# Patient Record
Sex: Female | Born: 1980
Health system: Southern US, Community
[De-identification: ages and names within clinical notes are randomized; demographics above are authoritative.]

## PROBLEM LIST (undated history)

## (undated) DIAGNOSIS — C4491 Basal cell carcinoma of skin, unspecified: Secondary | ICD-10-CM

## (undated) DIAGNOSIS — M359 Systemic involvement of connective tissue, unspecified: Secondary | ICD-10-CM

## (undated) DIAGNOSIS — M069 Rheumatoid arthritis, unspecified: Secondary | ICD-10-CM

## (undated) DIAGNOSIS — R87629 Unspecified abnormal cytological findings in specimens from vagina: Secondary | ICD-10-CM

## (undated) DIAGNOSIS — I7300 Raynaud's syndrome without gangrene: Secondary | ICD-10-CM

## (undated) DIAGNOSIS — R140 Abdominal distension (gaseous): Secondary | ICD-10-CM

## (undated) DIAGNOSIS — C4492 Squamous cell carcinoma of skin, unspecified: Secondary | ICD-10-CM

## (undated) DIAGNOSIS — E538 Deficiency of other specified B group vitamins: Secondary | ICD-10-CM

## (undated) DIAGNOSIS — Z87442 Personal history of urinary calculi: Secondary | ICD-10-CM

## (undated) DIAGNOSIS — R102 Pelvic and perineal pain: Secondary | ICD-10-CM

## (undated) DIAGNOSIS — K769 Liver disease, unspecified: Secondary | ICD-10-CM

## (undated) HISTORY — DX: Squamous cell carcinoma of skin, unspecified: C44.92

## (undated) HISTORY — DX: Pelvic and perineal pain: R10.2

## (undated) HISTORY — DX: Basal cell carcinoma of skin, unspecified: C44.91

## (undated) HISTORY — DX: Deficiency of other specified B group vitamins: E53.8

## (undated) HISTORY — DX: Raynaud's syndrome without gangrene: I73.00

## (undated) HISTORY — DX: Rheumatoid arthritis, unspecified: M06.9

## (undated) HISTORY — DX: Unspecified abnormal cytological findings in specimens from vagina: R87.629

---

## 2001-01-25 HISTORY — PX: LITHOTRIPSY: SUR834

## 2003-11-21 ENCOUNTER — Ambulatory Visit: Payer: Self-pay | Admitting: Obstetrics & Gynecology

## 2004-01-26 HISTORY — PX: LEEP: SHX91

## 2004-04-22 ENCOUNTER — Emergency Department: Payer: Self-pay | Admitting: Emergency Medicine

## 2005-10-26 ENCOUNTER — Other Ambulatory Visit: Payer: Self-pay

## 2005-10-26 ENCOUNTER — Emergency Department: Payer: Self-pay | Admitting: Emergency Medicine

## 2005-11-09 ENCOUNTER — Ambulatory Visit: Payer: Self-pay | Admitting: Obstetrics & Gynecology

## 2006-06-03 ENCOUNTER — Ambulatory Visit: Payer: Self-pay | Admitting: Urology

## 2006-06-30 ENCOUNTER — Ambulatory Visit: Payer: Self-pay | Admitting: Urology

## 2006-08-22 ENCOUNTER — Emergency Department: Payer: Self-pay | Admitting: Emergency Medicine

## 2006-09-01 ENCOUNTER — Encounter: Payer: Self-pay | Admitting: General Practice

## 2006-09-26 ENCOUNTER — Encounter: Payer: Self-pay | Admitting: General Practice

## 2006-11-16 ENCOUNTER — Ambulatory Visit: Payer: Self-pay | Admitting: Family Medicine

## 2006-12-05 ENCOUNTER — Ambulatory Visit: Payer: Self-pay | Admitting: Urology

## 2006-12-15 ENCOUNTER — Ambulatory Visit: Payer: Self-pay | Admitting: Urology

## 2007-02-14 ENCOUNTER — Other Ambulatory Visit: Payer: Self-pay

## 2007-02-14 ENCOUNTER — Emergency Department: Payer: Self-pay | Admitting: Emergency Medicine

## 2007-10-09 ENCOUNTER — Emergency Department: Payer: Self-pay | Admitting: Internal Medicine

## 2007-12-02 ENCOUNTER — Emergency Department: Payer: Self-pay | Admitting: Emergency Medicine

## 2008-10-29 ENCOUNTER — Emergency Department: Payer: Self-pay | Admitting: Emergency Medicine

## 2008-12-17 ENCOUNTER — Emergency Department: Payer: Self-pay | Admitting: Emergency Medicine

## 2009-05-13 ENCOUNTER — Ambulatory Visit: Payer: Self-pay | Admitting: Internal Medicine

## 2009-10-16 ENCOUNTER — Other Ambulatory Visit: Payer: Self-pay | Admitting: Physician Assistant

## 2010-02-06 ENCOUNTER — Emergency Department: Payer: Self-pay | Admitting: Emergency Medicine

## 2010-02-16 ENCOUNTER — Ambulatory Visit: Payer: Self-pay | Admitting: Internal Medicine

## 2010-04-03 ENCOUNTER — Ambulatory Visit: Payer: Self-pay | Admitting: Orthopedic Surgery

## 2011-03-24 ENCOUNTER — Other Ambulatory Visit: Payer: Self-pay

## 2011-03-24 LAB — CBC WITH DIFFERENTIAL/PLATELET
Basophil #: 0 10*3/uL (ref 0.0–0.1)
Basophil %: 0.3 %
Eosinophil #: 0.1 10*3/uL (ref 0.0–0.7)
Eosinophil %: 1.5 %
HCT: 38.5 % (ref 35.0–47.0)
HGB: 12.5 g/dL (ref 12.0–16.0)
Lymphocyte #: 1.1 10*3/uL (ref 1.0–3.6)
Lymphocyte %: 29.6 %
MCH: 30.9 pg (ref 26.0–34.0)
MCHC: 32.5 g/dL (ref 32.0–36.0)
MCV: 95 fL (ref 80–100)
Monocyte #: 0.3 10*3/uL (ref 0.0–0.7)
Monocyte %: 8.2 %
Neutrophil #: 2.2 10*3/uL (ref 1.4–6.5)
Neutrophil %: 60.4 %
Platelet: 201 10*3/uL (ref 150–440)
RBC: 4.05 10*6/uL (ref 3.80–5.20)
RDW: 11.8 % (ref 11.5–14.5)
WBC: 3.7 10*3/uL (ref 3.6–11.0)

## 2011-03-24 LAB — COMPREHENSIVE METABOLIC PANEL
Albumin: 3.7 g/dL (ref 3.4–5.0)
Alkaline Phosphatase: 46 U/L — ABNORMAL LOW (ref 50–136)
Anion Gap: 13 (ref 7–16)
BUN: 11 mg/dL (ref 7–18)
Bilirubin,Total: 0.5 mg/dL (ref 0.2–1.0)
Calcium, Total: 8.7 mg/dL (ref 8.5–10.1)
Chloride: 104 mmol/L (ref 98–107)
Co2: 25 mmol/L (ref 21–32)
Creatinine: 0.94 mg/dL (ref 0.60–1.30)
EGFR (African American): >60
EGFR (Non-African Amer.): >60
Glucose: 86 mg/dL (ref 65–99)
Osmolality: 282 (ref 275–301)
Potassium: 3.7 mmol/L (ref 3.5–5.1)
SGOT(AST): 20 U/L (ref 15–37)
SGPT (ALT): 23 U/L
Sodium: 142 mmol/L (ref 136–145)
Total Protein: 8 g/dL (ref 6.4–8.2)

## 2011-03-24 LAB — IRON AND TIBC
Iron Bind.Cap.(Total): 364 ug/dL (ref 250–450)
Iron Saturation: 27 %
Iron: 98 ug/dL (ref 50–170)
Unbound Iron-Bind.Cap.: 266 ug/dL

## 2011-03-24 LAB — LIPID PANEL
Cholesterol: 140 mg/dL (ref 0–200)
HDL Cholesterol: 55 mg/dL (ref 40–60)
Ldl Cholesterol, Calc: 74 mg/dL (ref 0–100)
Triglycerides: 54 mg/dL (ref 0–200)
VLDL Cholesterol, Calc: 11 mg/dL (ref 5–40)

## 2011-03-24 LAB — TSH: Thyroid Stimulating Horm: 3.12 u[IU]/mL

## 2011-03-24 LAB — FOLATE: Folic Acid: 26.1 ng/mL (ref 3.1–100.0)

## 2011-04-12 ENCOUNTER — Ambulatory Visit: Payer: Self-pay | Admitting: General Practice

## 2011-12-14 ENCOUNTER — Ambulatory Visit: Payer: Self-pay | Admitting: Internal Medicine

## 2012-08-05 ENCOUNTER — Emergency Department: Payer: Self-pay | Admitting: Emergency Medicine

## 2012-08-05 LAB — URINALYSIS, COMPLETE
Bilirubin,UR: NEGATIVE
Glucose,UR: NEGATIVE mg/dL (ref 0–75)
Ketone: NEGATIVE
Nitrite: NEGATIVE
Ph: 5 (ref 4.5–8.0)
Protein: 30
RBC,UR: 3 /HPF (ref 0–5)
Specific Gravity: 1.032 (ref 1.003–1.030)
Squamous Epithelial: 6
WBC UR: 3 /HPF (ref 0–5)

## 2012-08-05 LAB — CBC
HCT: 38.7 % (ref 35.0–47.0)
HGB: 12.9 g/dL (ref 12.0–16.0)
MCH: 31.4 pg (ref 26.0–34.0)
MCHC: 33.4 g/dL (ref 32.0–36.0)
MCV: 94 fL (ref 80–100)
Platelet: 201 10*3/uL (ref 150–440)
RBC: 4.13 10*6/uL (ref 3.80–5.20)
RDW: 12.8 % (ref 11.5–14.5)
WBC: 9.8 10*3/uL (ref 3.6–11.0)

## 2012-08-05 LAB — BASIC METABOLIC PANEL
Anion Gap: 6 — ABNORMAL LOW (ref 7–16)
BUN: 13 mg/dL (ref 7–18)
Calcium, Total: 9 mg/dL (ref 8.5–10.1)
Chloride: 106 mmol/L (ref 98–107)
Co2: 27 mmol/L (ref 21–32)
Creatinine: 1.19 mg/dL (ref 0.60–1.30)
EGFR (African American): >60
EGFR (Non-African Amer.): 60 — ABNORMAL LOW
Glucose: 87 mg/dL (ref 65–99)
Osmolality: 277 (ref 275–301)
Potassium: 4 mmol/L (ref 3.5–5.1)
Sodium: 139 mmol/L (ref 136–145)

## 2012-08-09 ENCOUNTER — Other Ambulatory Visit: Payer: Self-pay

## 2012-08-09 LAB — BASIC METABOLIC PANEL
Anion Gap: 2 — ABNORMAL LOW (ref 7–16)
BUN: 10 mg/dL (ref 7–18)
Calcium, Total: 9 mg/dL (ref 8.5–10.1)
Chloride: 106 mmol/L (ref 98–107)
Co2: 31 mmol/L (ref 21–32)
Creatinine: 0.9 mg/dL (ref 0.60–1.30)
EGFR (African American): >60
EGFR (Non-African Amer.): >60
Glucose: 93 mg/dL (ref 65–99)
Osmolality: 276 (ref 275–301)
Potassium: 4.2 mmol/L (ref 3.5–5.1)
Sodium: 139 mmol/L (ref 136–145)

## 2012-08-09 LAB — CBC
HCT: 38.7 % (ref 35.0–47.0)
HGB: 13 g/dL (ref 12.0–16.0)
MCH: 31.1 pg (ref 26.0–34.0)
MCHC: 33.5 g/dL (ref 32.0–36.0)
MCV: 93 fL (ref 80–100)
Platelet: 221 10*3/uL (ref 150–440)
RBC: 4.18 10*6/uL (ref 3.80–5.20)
RDW: 12.9 % (ref 11.5–14.5)
WBC: 4.8 10*3/uL (ref 3.6–11.0)

## 2014-02-21 LAB — HM PAP SMEAR: HM Pap smear: NEGATIVE

## 2014-10-01 ENCOUNTER — Other Ambulatory Visit
Admission: RE | Admit: 2014-10-01 | Discharge: 2014-10-01 | Disposition: A | Discharge status: Home / Self Care | Payer: 59 | Source: Ambulatory Visit | Attending: Nurse Practitioner | Admitting: Nurse Practitioner

## 2014-10-01 DIAGNOSIS — R635 Abnormal weight gain: Secondary | ICD-10-CM | POA: Insufficient documentation

## 2014-10-01 DIAGNOSIS — A6920 Lyme disease, unspecified: Secondary | ICD-10-CM | POA: Diagnosis present

## 2014-10-01 DIAGNOSIS — R5383 Other fatigue: Secondary | ICD-10-CM | POA: Insufficient documentation

## 2014-10-01 LAB — FOLATE: Folate: 13 ng/mL (ref >5.9)

## 2014-10-01 LAB — IRON AND TIBC
Iron: 64 ug/dL (ref 28–170)
Saturation Ratios: 21 % (ref 10.4–31.8)
TIBC: 312 ug/dL (ref 250–450)
UIBC: 248 ug/dL

## 2014-10-01 LAB — COMPREHENSIVE METABOLIC PANEL
ALT: 14 U/L (ref 14–54)
AST: 22 U/L (ref 15–41)
Albumin: 4.4 g/dL (ref 3.5–5.0)
Alkaline Phosphatase: 58 U/L (ref 38–126)
Anion gap: 6 (ref 5–15)
BUN: 11 mg/dL (ref 6–20)
CO2: 25 mmol/L (ref 22–32)
Calcium: 8.4 mg/dL — ABNORMAL LOW (ref 8.9–10.3)
Chloride: 99 mmol/L — ABNORMAL LOW (ref 101–111)
Creatinine, Ser: 0.89 mg/dL (ref 0.44–1.00)
GFR calc Af Amer: >60 mL/min (ref >60)
GFR calc non Af Amer: >60 mL/min (ref >60)
Glucose, Bld: 90 mg/dL (ref 65–99)
Potassium: 3.3 mmol/L — ABNORMAL LOW (ref 3.5–5.1)
Sodium: 130 mmol/L — ABNORMAL LOW (ref 135–145)
Total Bilirubin: 0.8 mg/dL (ref 0.3–1.2)
Total Protein: 8.2 g/dL — ABNORMAL HIGH (ref 6.5–8.1)

## 2014-10-01 LAB — T4, FREE: Free T4: 0.84 ng/dL (ref 0.61–1.12)

## 2014-10-01 LAB — LIPID PANEL
Cholesterol: 167 mg/dL (ref 0–200)
HDL: 66 mg/dL (ref >40)
LDL Cholesterol: 95 mg/dL (ref 0–99)
Total CHOL/HDL Ratio: 2.5 RATIO
Triglycerides: 30 mg/dL (ref <150)
VLDL: 6 mg/dL (ref 0–40)

## 2014-10-01 LAB — CBC
HCT: 39.7 % (ref 35.0–47.0)
Hemoglobin: 12.9 g/dL (ref 12.0–16.0)
MCH: 30.5 pg (ref 26.0–34.0)
MCHC: 32.5 g/dL (ref 32.0–36.0)
MCV: 93.9 fL (ref 80.0–100.0)
Platelets: 209 10*3/uL (ref 150–440)
RBC: 4.23 MIL/uL (ref 3.80–5.20)
RDW: 13.3 % (ref 11.5–14.5)
WBC: 4.9 10*3/uL (ref 3.6–11.0)

## 2014-10-01 LAB — FERRITIN: Ferritin: 66 ng/mL (ref 11–307)

## 2014-10-01 LAB — TSH: TSH: 2.709 u[IU]/mL (ref 0.350–4.500)

## 2014-10-02 LAB — B. BURGDORFI ANTIBODIES: B burgdorferi Ab IgG+IgM: <0.91 {ISR} (ref 0.00–0.90)

## 2014-10-02 LAB — VITAMIN B12: Vitamin B-12: 289 pg/mL (ref 180–914)

## 2014-10-02 LAB — T3: T3, Total: 116 ng/dL (ref 71–180)

## 2014-10-09 LAB — VITAMIN D 25 HYDROXY (VIT D DEFICIENCY, FRACTURES): Vit D, 25-Hydroxy: 29.7 ng/mL — ABNORMAL LOW (ref 30.0–100.0)

## 2014-10-11 ENCOUNTER — Other Ambulatory Visit
Admission: RE | Admit: 2014-10-11 | Discharge: 2014-10-11 | Disposition: A | Discharge status: Home / Self Care | Payer: 59 | Source: Ambulatory Visit | Attending: Nurse Practitioner | Admitting: Nurse Practitioner

## 2014-10-11 DIAGNOSIS — E871 Hypo-osmolality and hyponatremia: Secondary | ICD-10-CM | POA: Diagnosis present

## 2014-10-11 DIAGNOSIS — E876 Hypokalemia: Secondary | ICD-10-CM | POA: Insufficient documentation

## 2014-10-11 DIAGNOSIS — R5383 Other fatigue: Secondary | ICD-10-CM | POA: Insufficient documentation

## 2014-10-11 LAB — CORTISOL-AM, BLOOD: Cortisol - AM: 10.4 ug/dL (ref 6.7–22.6)

## 2014-10-14 LAB — ACTH: C206 ACTH: 21.6 pg/mL (ref 7.2–63.3)

## 2015-02-24 ENCOUNTER — Encounter: Payer: Self-pay | Admitting: *Deleted

## 2015-02-28 ENCOUNTER — Ambulatory Visit (INDEPENDENT_AMBULATORY_CARE_PROVIDER_SITE_OTHER): Payer: 59 | Admitting: Obstetrics and Gynecology

## 2015-02-28 ENCOUNTER — Encounter: Payer: Self-pay | Admitting: Obstetrics and Gynecology

## 2015-02-28 VITALS — BP 116/61 | HR 72 | Ht 70.0 in | Wt 170.9 lb

## 2015-02-28 DIAGNOSIS — Z79899 Other long term (current) drug therapy: Secondary | ICD-10-CM | POA: Insufficient documentation

## 2015-02-28 DIAGNOSIS — Z01419 Encounter for gynecological examination (general) (routine) without abnormal findings: Secondary | ICD-10-CM | POA: Diagnosis not present

## 2015-02-28 DIAGNOSIS — Z30431 Encounter for routine checking of intrauterine contraceptive device: Secondary | ICD-10-CM

## 2015-02-28 DIAGNOSIS — R5383 Other fatigue: Secondary | ICD-10-CM | POA: Diagnosis not present

## 2015-02-28 MED ORDER — LISDEXAMFETAMINE DIMESYLATE 40 MG PO CAPS: 80.0000 mg | ORAL_CAPSULE | Freq: Every day | ORAL | Status: DC

## 2015-02-28 NOTE — Patient Instructions (Signed)
Place annual gynecologic exam patient instructions here.

## 2015-02-28 NOTE — Progress Notes (Signed)
Subjective:   Haley Wilkins is a 35 y.o. G46P0 Caucasian female here for a routine well-woman exam.  No LMP recorded. Patient is not currently having periods (Reason: IUD).    Current complaints: FATIGUE AND GENERALIZED MALAISE PCP: ?       does desire labs  Social History: Sexual: heterosexual Marital Status: married Living situation: with spouse Occupation: unknown occupation Tobacco/alcohol: no tobacco use Illicit drugs: no history of illicit drug use  The following portions of the patient's history were reviewed and updated as appropriate: allergies, current medications, past family history, past medical history, past social history, past surgical history and problem list.  Past Medical History Past Medical History  Diagnosis Date  . Vitamin B 12 deficiency   . Rheumatoid arthritis ([place])   . Raynaud's disease   . Pelvic pain in female   . Vaginal Pap smear, abnormal     Past Surgical History Past Surgical History  Procedure Laterality Date  . Leep  2006  . Lithotripsy  2003    Gynecologic History G1P0  No LMP recorded. Patient is not currently having periods (Reason: IUD). Contraception: IUD Last Pap: 2015. Results were: normal   Obstetric History OB History  Gravida Para Term Preterm AB SAB TAB Ectopic Multiple Living  1             # Outcome Date GA Lbr Len/2nd Weight Sex Delivery Anes PTL Lv  1 Gravida 2005     CS-Unspec         Current Medications Current Outpatient Prescriptions on File Prior to Visit  Medication Sig Dispense Refill  . levonorgestrel (MIRENA) 20 MCG/24HR IUD 1 each by Intrauterine route once.     No current facility-administered medications on file prior to visit.    Review of Systems Patient denies any headaches, blurred vision, shortness of breath, chest pain, abdominal pain, problems with bowel movements, urination, or intercourse.  Objective:  BP 116/61 mmHg  Pulse 72  Ht 5\' 10"  (1.778 m)  Wt 170 lb 14.4 oz (77.52 kg)  BMI  24.52 kg/m2 Physical Exam  General:  Well developed, well nourished, no acute distress. She is alert and oriented x3. Skin:  Warm and dry Neck:  Midline trachea, no thyromegaly or nodules Cardiovascular: Regular rate and rhythm, no murmur heard Lungs:  Effort normal, all lung fields clear to auscultation bilaterally Breasts:  No dominant palpable mass, retraction, or nipple discharge Abdomen:  Soft, non tender, no hepatosplenomegaly or masses Pelvic:  External genitalia is normal in appearance.  The vagina is normal in appearance. The cervix is bulbous, no CMT.  Thin prep pap is done with HR HPV cotesting. Uterus is felt to be normal size, shape, and contour.  No adnexal masses or tenderness noted. IUD string noted Extremities:  No swelling or varicosities noted Psych:  She has a normal mood and affect for her, somewhat depressed today  Assessment:   Healthy well-woman exam RA Fatigue IUD survellience  Plan:  Pap and labs obtained F/U 1 year for AE, or sooner if needed  Increased vyvanse dose to 80mg  daily. To follow up with PCP otherwise.  Evi Mccomb Rockney Ghee, CNM

## 2015-03-01 LAB — CBC
Hematocrit: 38.9 % (ref 34.0–46.6)
Hemoglobin: 12.7 g/dL (ref 11.1–15.9)
MCH: 30.1 pg (ref 26.6–33.0)
MCHC: 32.6 g/dL (ref 31.5–35.7)
MCV: 92 fL (ref 79–97)
Platelets: 225 10*3/uL (ref 150–379)
RBC: 4.22 x10E6/uL (ref 3.77–5.28)
RDW: 12.8 % (ref 12.3–15.4)
WBC: 4.5 10*3/uL (ref 3.4–10.8)

## 2015-03-01 LAB — THYROID PANEL WITH TSH
Free Thyroxine Index: 2 (ref 1.2–4.9)
T3 Uptake Ratio: 30 % (ref 24–39)
T4, Total: 6.8 ug/dL (ref 4.5–12.0)
TSH: 2.53 u[IU]/mL (ref 0.450–4.500)

## 2015-03-01 LAB — COMPREHENSIVE METABOLIC PANEL
ALT: 8 IU/L (ref 0–32)
AST: 18 IU/L (ref 0–40)
Albumin/Globulin Ratio: 1.4 (ref 1.1–2.5)
Albumin: 4.2 g/dL (ref 3.5–5.5)
Alkaline Phosphatase: 57 IU/L (ref 39–117)
BUN/Creatinine Ratio: 12 (ref 8–20)
BUN: 9 mg/dL (ref 6–20)
Bilirubin Total: 0.5 mg/dL (ref 0.0–1.2)
CO2: 18 mmol/L (ref 18–29)
Calcium: 8.8 mg/dL (ref 8.7–10.2)
Chloride: 103 mmol/L (ref 96–106)
Creatinine, Ser: 0.78 mg/dL (ref 0.57–1.00)
GFR calc Af Amer: 113 mL/min/{1.73_m2} (ref >59)
GFR calc non Af Amer: 98 mL/min/{1.73_m2} (ref >59)
Globulin, Total: 3 g/dL (ref 1.5–4.5)
Glucose: 86 mg/dL (ref 65–99)
Potassium: 4.2 mmol/L (ref 3.5–5.2)
Sodium: 141 mmol/L (ref 134–144)
Total Protein: 7.2 g/dL (ref 6.0–8.5)

## 2015-03-01 LAB — LIPID PANEL
Chol/HDL Ratio: 2.2 ratio units (ref 0.0–4.4)
Cholesterol, Total: 140 mg/dL (ref 100–199)
HDL: 64 mg/dL (ref >39)
LDL Calculated: 68 mg/dL (ref 0–99)
Triglycerides: 42 mg/dL (ref 0–149)
VLDL Cholesterol Cal: 8 mg/dL (ref 5–40)

## 2015-03-01 LAB — VITAMIN D 25 HYDROXY (VIT D DEFICIENCY, FRACTURES): Vit D, 25-Hydroxy: 25.1 ng/mL — ABNORMAL LOW (ref 30.0–100.0)

## 2015-03-03 ENCOUNTER — Other Ambulatory Visit: Payer: Self-pay | Admitting: Obstetrics and Gynecology

## 2015-03-03 DIAGNOSIS — Z1151 Encounter for screening for human papillomavirus (HPV): Secondary | ICD-10-CM | POA: Diagnosis not present

## 2015-03-03 DIAGNOSIS — Z124 Encounter for screening for malignant neoplasm of cervix: Secondary | ICD-10-CM | POA: Diagnosis not present

## 2015-03-04 LAB — CYTOLOGY - PAP

## 2015-03-06 ENCOUNTER — Other Ambulatory Visit: Payer: Self-pay | Admitting: Obstetrics and Gynecology

## 2015-03-06 DIAGNOSIS — IMO0002 Reserved for concepts with insufficient information to code with codable children: Secondary | ICD-10-CM | POA: Insufficient documentation

## 2015-03-10 ENCOUNTER — Telehealth: Payer: Self-pay | Admitting: *Deleted

## 2015-03-10 NOTE — Telephone Encounter (Signed)
-----   Message from Joylene Igo, [place] sent at 03/06/2015  4:51 PM EST ----- Please mail info on pap results

## 2015-03-10 NOTE — Telephone Encounter (Signed)
Notified pt mailed info

## 2015-03-19 ENCOUNTER — Ambulatory Visit (INDEPENDENT_AMBULATORY_CARE_PROVIDER_SITE_OTHER): Payer: 59 | Admitting: Obstetrics and Gynecology

## 2015-03-19 ENCOUNTER — Encounter: Payer: Self-pay | Admitting: Obstetrics and Gynecology

## 2015-03-19 ENCOUNTER — Other Ambulatory Visit: Payer: Self-pay | Admitting: Obstetrics and Gynecology

## 2015-03-19 VITALS — BP 115/68 | HR 105 | Ht 70.0 in | Wt 168.6 lb

## 2015-03-19 DIAGNOSIS — D260 Other benign neoplasm of cervix uteri: Secondary | ICD-10-CM | POA: Diagnosis not present

## 2015-03-19 DIAGNOSIS — R21 Rash and other nonspecific skin eruption: Secondary | ICD-10-CM

## 2015-03-19 DIAGNOSIS — R896 Abnormal cytological findings in specimens from other organs, systems and tissues: Secondary | ICD-10-CM

## 2015-03-19 DIAGNOSIS — M069 Rheumatoid arthritis, unspecified: Secondary | ICD-10-CM

## 2015-03-19 DIAGNOSIS — IMO0002 Reserved for concepts with insufficient information to code with codable children: Secondary | ICD-10-CM

## 2015-03-19 DIAGNOSIS — B977 Papillomavirus as the cause of diseases classified elsewhere: Secondary | ICD-10-CM | POA: Diagnosis not present

## 2015-03-19 NOTE — Progress Notes (Signed)
Patient ID: Haley Wilkins, female   DOB: 1980/10/20, 35 y.o.   MRN: [id number]  Chief Complaint  Patient presents with  . Colposcopy    abnormal pap ACSUS 03/03/15    HPI Haley Wilkins is a 35 y.o. female.  H/o LEEP 9-10 years ago.   HPI Menses have returned in last 6 months, with flare up of RA with body aches and HAs, symptoms disappear when menses start. Indications: Pap smear on January 2017 showed: ASCUS with POSITIVE high risk HPV.  Prior cervical treatment: LLETZ.  Past Medical History  Diagnosis Date  . Vitamin B 12 deficiency   . Rheumatoid arthritis ([place])   . Raynaud's disease   . Pelvic pain in female   . Vaginal Pap smear, abnormal     Past Surgical History  Procedure Laterality Date  . Leep  2006  . Lithotripsy  2003    Family History  Problem Relation Age of Onset  . Diabetes Mother   . Diabetes Father   . Diabetes Maternal Grandfather     Social History Social History  Substance Use Topics  . Smoking status: Never Smoker   . Smokeless tobacco: Never Used  . Alcohol Use: Yes    No Known Allergies  Current Outpatient Prescriptions  Medication Sig Dispense Refill  . levonorgestrel (MIRENA) 20 MCG/24HR IUD 1 each by Intrauterine route once.    Marland Kitchen levothyroxine (SYNTHROID, LEVOTHROID) 25 MCG tablet Take 25 mcg by mouth daily before breakfast.    . lisdexamfetamine (VYVANSE) 40 MG capsule Take 2 capsules (80 mg total) by mouth daily. 60 capsule 0  . potassium chloride (K-DUR,KLOR-CON) 10 MEQ tablet Take 10 mEq by mouth 2 (two) times daily.     No current facility-administered medications for this visit.    Review of Systems Review of Systems See HPI above, also concerned about area that appeared on right upper inner breast, red and itches, x 1 week. Blood pressure 115/68, pulse 105, height 5\' 10"  (1.778 m), weight 168 lb 9.6 oz (76.476 kg).  Physical Exam Physical Exam A&O x4 Well groomed female in no distress Pelvic exam: normal external genitalia,  vulva, vagina, cervix, uterus and adnexa, IUD string noted with dark red scant rubra. ECC obtained, no biopsies indicated. See scan colpo sheet. 2x3 red flat area on right upper breast/chest wall, blanches, irregular borders, no scaling, superficial. Data Reviewed Prev. Pap and LEEP, previous history of IUD use  Assessment   1: ASCUS Procedure Details  The risks and benefits of the procedure and Written informed consent obtained.  Speculum placed in vagina and excellent visualization of cervix achieved, cervix swabbed x 3 with acetic acid solution.  Specimens: ECC only  Complications: none. 2: Mirena counseling- menses returning causing flare up of RA 3: flat red area on upper right breast unknown etiology    Plan    Specimens labelled and sent to Pathology. Return to replace Mirena results in 2 weeks. To use cortaid bid x 2 weeks and will re-evaluate at next visit if persist.      [place], CNM 03/19/2015, 12:32 PM

## 2015-03-19 NOTE — Patient Instructions (Signed)
COLPOSCOPY POST-PROCEDURE INSTRUCTIONS  1. You may take Ibuprofen, Aleve or Tylenol for cramping if needed.  2. If Monsel's solution was used, you will have a black discharge.  3. Light bleeding is normal.  If bleeding is heavier than your period, please call.  4. Put nothing in your vagina until the bleeding or discharge stops (usually 2 or 3 days).  5. We will call you within one week with biopsy results or discuss the results at your follow-up appointment if needed.  

## 2015-03-24 ENCOUNTER — Encounter: Payer: Self-pay | Admitting: Obstetrics and Gynecology

## 2015-04-02 ENCOUNTER — Encounter: Payer: Self-pay | Admitting: Obstetrics and Gynecology

## 2015-04-02 ENCOUNTER — Ambulatory Visit (INDEPENDENT_AMBULATORY_CARE_PROVIDER_SITE_OTHER): Payer: 59 | Admitting: Obstetrics and Gynecology

## 2015-04-02 VITALS — BP 109/64 | HR 81 | Wt 167.9 lb

## 2015-04-02 DIAGNOSIS — Z30433 Encounter for removal and reinsertion of intrauterine contraceptive device: Secondary | ICD-10-CM

## 2015-04-02 MED ORDER — OMEPRAZOLE 20 MG PO CPDR
20.0000 mg | DELAYED_RELEASE_CAPSULE | Freq: Every day | ORAL | Status: DC
Start: 2015-04-02 — End: 2015-11-12

## 2015-04-02 NOTE — Patient Instructions (Signed)
IUD PLACEMENT POST-PROCEDURE INSTRUCTIONS  1. You may take Ibuprofen, Aleve or Tylenol for pain if needed.  Cramping should resolve within in 24 hours.  2. You may have a small amount of spotting.  You should wear a mini pad for the next few days.  3. You may have intercourse after 24 hours.  If you using this for birth control, it is effective immediately.  4. You need to call if you have any pelvic pain, fever, heavy bleeding or foul smelling vaginal discharge.  Irregular bleeding is common the first several months after having an IUD placed. You do not need to call for this reason unless you are concerned.  5. Shower or bathe as normal   

## 2015-04-02 NOTE — Progress Notes (Signed)
JENNINGS BUSHNER is a 35 y.o. year old G30P0 Caucasian female who presents for removal and replacement of a Mirena IUD. She was given informed consent for removal and reinsertion of her Mirena. Her Mirena was placed 2010, No LMP recorded. Patient is not currently having periods (Reason: IUD)., and her pregnancy test today was negative.   The risks and benefits of the method and placement have been thouroughly reviewed with the patient and all questions were answered.  Specifically the patient is aware of failure rate of 01/998, expulsion of the IUD and of possible perforation.  The patient is aware of irregular bleeding due to the method and understands the incidence of irregular bleeding diminishes with time.  Signed copy of informed consent in chart.   No LMP recorded. Patient is not currently having periods (Reason: IUD). BP 109/64 mmHg  Pulse 81  Wt 167 lb 14.4 oz (76.159 kg) No results found for this or any previous visit (from the past 24 hour(s)).   Appropriate time out taken. A graves speculum was placed in the vagina.  The cervix was visualized, prepped using Betadine. The strings were visible. They were grasped and the Mirena was easily removed. The cervix was then grasped with a single-tooth tenaculum. The uterus was found to be neutral and it sounded to 8 cm.  Mirena IUD placed per manufacturer's recommendations without complications. The strings were trimmed to 3 cm.  The patient tolerated the procedure well.    The patient was given post procedure instructions, including signs and symptoms of infection and to check for the strings after each menses or each month, and refraining from intercourse or anything in the vagina for 3 days.  She was given a Mirena care card with date Mirena placed, and date Mirena to be removed.    Everly Rubalcava Rockney Ghee, CNM

## 2015-05-30 DIAGNOSIS — R5383 Other fatigue: Secondary | ICD-10-CM | POA: Diagnosis not present

## 2015-05-30 DIAGNOSIS — E039 Hypothyroidism, unspecified: Secondary | ICD-10-CM | POA: Diagnosis not present

## 2015-05-30 DIAGNOSIS — R41840 Attention and concentration deficit: Secondary | ICD-10-CM | POA: Diagnosis not present

## 2015-09-05 DIAGNOSIS — E039 Hypothyroidism, unspecified: Secondary | ICD-10-CM | POA: Diagnosis not present

## 2015-09-05 DIAGNOSIS — F411 Generalized anxiety disorder: Secondary | ICD-10-CM | POA: Diagnosis not present

## 2015-09-05 DIAGNOSIS — R41840 Attention and concentration deficit: Secondary | ICD-10-CM | POA: Diagnosis not present

## 2015-09-12 ENCOUNTER — Other Ambulatory Visit: Payer: Self-pay | Admitting: Obstetrics and Gynecology

## 2015-09-12 ENCOUNTER — Encounter: Payer: Self-pay | Admitting: Obstetrics and Gynecology

## 2015-09-12 ENCOUNTER — Ambulatory Visit (INDEPENDENT_AMBULATORY_CARE_PROVIDER_SITE_OTHER): Payer: 59 | Admitting: Obstetrics and Gynecology

## 2015-09-12 VITALS — BP 95/61 | HR 82 | Ht 70.0 in | Wt 155.9 lb

## 2015-09-12 DIAGNOSIS — Z01419 Encounter for gynecological examination (general) (routine) without abnormal findings: Secondary | ICD-10-CM | POA: Diagnosis not present

## 2015-09-12 DIAGNOSIS — R87610 Atypical squamous cells of undetermined significance on cytologic smear of cervix (ASC-US): Secondary | ICD-10-CM

## 2015-09-12 NOTE — Progress Notes (Signed)
Subjective:     Patient ID: Haley Wilkins, female   DOB: November 25, 1980, 35 y.o.   MRN: [id number]  HPI Prev. Pap ASCUS, +HPV here for follow up pap  Review of Systems negative    Objective:   Physical Exam A&O x4 Well groomed female in no distress Blood pressure 95/61, pulse 82, height 5\' 10"  (1.778 m), weight 155 lb 14.4 oz (70.7 kg). Pelvic exam: normal external genitalia, vulva, vagina, cervix, uterus and adnexa, PAP: Pap smear done today, IUD string noted.    Assessment:     Prev. Abnormal pap ASCUS, +HPV    Plan:     Pap repeated RTC in 6 months for AE  Melody shambley, CNM

## 2015-09-15 LAB — CYTOLOGY - PAP

## 2015-09-19 ENCOUNTER — Telehealth: Payer: Self-pay | Admitting: Obstetrics and Gynecology

## 2015-09-19 NOTE — Telephone Encounter (Signed)
Calling for results of pap

## 2015-10-01 ENCOUNTER — Encounter: Payer: Self-pay | Admitting: Emergency Medicine

## 2015-10-01 ENCOUNTER — Emergency Department: Payer: 59

## 2015-10-01 ENCOUNTER — Emergency Department
Admission: EM | Admit: 2015-10-01 | Discharge: 2015-10-01 | Disposition: A | Discharge status: Home / Self Care | Payer: 59 | Attending: Student in an Organized Health Care Education/Training Program | Admitting: Student in an Organized Health Care Education/Training Program

## 2015-10-01 DIAGNOSIS — R109 Unspecified abdominal pain: Secondary | ICD-10-CM | POA: Diagnosis not present

## 2015-10-01 DIAGNOSIS — D1803 Hemangioma of intra-abdominal structures: Secondary | ICD-10-CM | POA: Insufficient documentation

## 2015-10-01 DIAGNOSIS — R1011 Right upper quadrant pain: Secondary | ICD-10-CM | POA: Diagnosis not present

## 2015-10-01 DIAGNOSIS — R1010 Upper abdominal pain, unspecified: Secondary | ICD-10-CM

## 2015-10-01 LAB — URINALYSIS COMPLETE WITH MICROSCOPIC (ARMC ONLY)
Bilirubin Urine: NEGATIVE
Glucose, UA: NEGATIVE mg/dL
Hgb urine dipstick: NEGATIVE
Ketones, ur: NEGATIVE mg/dL
Leukocytes, UA: NEGATIVE
Nitrite: NEGATIVE
Protein, ur: NEGATIVE mg/dL
Specific Gravity, Urine: 1.006 (ref 1.005–1.030)
pH: 7 (ref 5.0–8.0)

## 2015-10-01 LAB — CBC
HCT: 41.3 % (ref 35.0–47.0)
Hemoglobin: 14 g/dL (ref 12.0–16.0)
MCH: 31.8 pg (ref 26.0–34.0)
MCHC: 33.8 g/dL (ref 32.0–36.0)
MCV: 94.1 fL (ref 80.0–100.0)
Platelets: 230 10*3/uL (ref 150–440)
RBC: 4.39 MIL/uL (ref 3.80–5.20)
RDW: 12.5 % (ref 11.5–14.5)
WBC: 4.6 10*3/uL (ref 3.6–11.0)

## 2015-10-01 LAB — COMPREHENSIVE METABOLIC PANEL
ALT: 15 U/L (ref 14–54)
AST: 23 U/L (ref 15–41)
Albumin: 4.4 g/dL (ref 3.5–5.0)
Alkaline Phosphatase: 58 U/L (ref 38–126)
Anion gap: 5 (ref 5–15)
BUN: 9 mg/dL (ref 6–20)
CO2: 27 mmol/L (ref 22–32)
Calcium: 9.2 mg/dL (ref 8.9–10.3)
Chloride: 106 mmol/L (ref 101–111)
Creatinine, Ser: 0.82 mg/dL (ref 0.44–1.00)
GFR calc Af Amer: >60 mL/min (ref >60)
GFR calc non Af Amer: >60 mL/min (ref >60)
Glucose, Bld: 108 mg/dL — ABNORMAL HIGH (ref 65–99)
Potassium: 3.8 mmol/L (ref 3.5–5.1)
Sodium: 138 mmol/L (ref 135–145)
Total Bilirubin: 1.1 mg/dL (ref 0.3–1.2)
Total Protein: 8.3 g/dL — ABNORMAL HIGH (ref 6.5–8.1)

## 2015-10-01 LAB — LIPASE, BLOOD: Lipase: 34 U/L (ref 11–51)

## 2015-10-01 LAB — PREGNANCY, URINE: Preg Test, Ur: NEGATIVE

## 2015-10-01 MED ORDER — ONDANSETRON 4 MG PO TBDP
4.0000 mg | ORAL_TABLET | Freq: Once | ORAL | Status: AC
  Administered 2015-10-01: 4 mg via ORAL
  Filled 2015-10-01: qty 1

## 2015-10-01 MED ORDER — IOPAMIDOL (ISOVUE-300) INJECTION 61%
30.0000 mL | Freq: Once | INTRAVENOUS | Status: AC | PRN
  Administered 2015-10-01: 30 mL via ORAL

## 2015-10-01 MED ORDER — IOPAMIDOL (ISOVUE-300) INJECTION 61%
100.0000 mL | Freq: Once | INTRAVENOUS | Status: AC | PRN
  Administered 2015-10-01: 100 mL via INTRAVENOUS

## 2015-10-01 MED ORDER — POLYETHYLENE GLYCOL 3350 17 GM/SCOOP PO POWD: 1.0000 | Freq: Once | ORAL | 0 refills | Status: AC

## 2015-10-01 MED ORDER — ONDANSETRON HCL 4 MG/2ML IJ SOLN
4.0000 mg | Freq: Once | INTRAMUSCULAR | Status: AC
  Administered 2015-10-01: 4 mg via INTRAVENOUS
  Filled 2015-10-01: qty 2

## 2015-10-01 NOTE — ED Provider Notes (Signed)
[facility] Emergency Department Provider Note        Time seen: ----------------------------------------- 12:46 PM on 10/01/2015 -----------------------------------------    I have reviewed the triage vital signs and the nursing notes.   HISTORY  Chief Complaint Abdominal Pain and Nausea    HPI Haley Wilkins is a 35 y.o. female who presents to ER with right upper quadrant pain that started 3-4 months has become more intense over the last several days. Patient states last night after eating pain became a lot worse, she describes nausea. She has taken antacids without significant improvement in her symptoms. Initially symptoms started the epigastrium now they're more in the right upper quadrant.   Past Medical History:  Diagnosis Date  . Pelvic pain in female   . Raynaud's disease   . Rheumatoid arthritis ([place])   . Vaginal Pap smear, abnormal   . Vitamin B 12 deficiency     Patient Active Problem List   Diagnosis Date Noted  . ASCUS with positive high risk HPV 03/06/2015  . Fatigue due to treatment 02/28/2015  . IUD check up 02/28/2015    Past Surgical History:  Procedure Laterality Date  . CESAREAN SECTION    . LEEP  2006  . LITHOTRIPSY  2003    Allergies Review of patient's allergies indicates no known allergies.  Social History Social History  Substance Use Topics  . Smoking status: Never Smoker  . Smokeless tobacco: Never Used  . Alcohol use Yes    Review of Systems Constitutional: Negative for fever. Cardiovascular: Negative for chest pain. Respiratory: Negative for shortness of breath. Gastrointestinal: Positive for abdominal pain, nausea Genitourinary: Negative for dysuria. Musculoskeletal: Negative for back pain. Skin: Negative for rash. Neurological: Negative for headaches, focal weakness or numbness.  10-point ROS otherwise negative.  ____________________________________________   PHYSICAL EXAM:  VITAL  SIGNS: ED Triage Vitals  Enc Vitals Group     BP 10/01/15 1104 115/60     Pulse Rate 10/01/15 1104 75     Resp 10/01/15 1104 20     Temp 10/01/15 1104 97.8 F (36.6 C)     Temp Source 10/01/15 1104 Oral     SpO2 10/01/15 1104 100 %     Weight 10/01/15 1104 155 lb (70.3 kg)     Height 10/01/15 1104 5\' 10"  (1.778 m)     Head Circumference --      Peak Flow --      Pain Score 10/01/15 1119 4     Pain Loc --      Pain Edu? --      Excl. in [place]? --     Constitutional: Alert and oriented. Well appearing and in no distress. Eyes: Conjunctivae are normal. PERRL. Normal extraocular movements. ENT   Head: Normocephalic and atraumatic.   Nose: No congestion/rhinnorhea.   Mouth/Throat: Mucous membranes are moist.   Neck: No stridor. Cardiovascular: Normal rate, regular rhythm. No murmurs, rubs, or gallops. Respiratory: Normal respiratory effort without tachypnea nor retractions. Breath sounds are clear and equal bilaterally. No wheezes/rales/rhonchi. Gastrointestinal: Right upper quadrant tenderness, no rebound or guarding. Normal bowel sounds. Musculoskeletal: Nontender with normal range of motion in all extremities. No lower extremity tenderness nor edema. Neurologic:  Normal speech and language. No gross focal neurologic deficits are appreciated.  Skin:  Skin is warm, dry and intact. No rash noted. Psychiatric: Mood and affect are normal. Speech and behavior are normal.  ____________________________________________  ED COURSE:  Pertinent labs & imaging results that  were available during my care of the patient were reviewed by me and considered in my medical decision making (see chart for details). Clinical Course  Patient is in no distress, we'll assess with labs and imaging.  Procedures ____________________________________________   LABS (pertinent positives/negatives)  Labs Reviewed  COMPREHENSIVE METABOLIC PANEL - Abnormal; Notable for the following:       Result  Value   Glucose, Bld 108 (*)    Total Protein 8.3 (*)    All other components within normal limits  URINALYSIS COMPLETEWITH MICROSCOPIC ([facility] ONLY) - Abnormal; Notable for the following:    Color, Urine YELLOW (*)    APPearance CLEAR (*)    Bacteria, UA RARE (*)    Squamous Epithelial / LPF 0-5 (*)    All other components within normal limits  LIPASE, BLOOD  CBC  PREGNANCY, URINE  POC URINE PREG, ED    RADIOLOGY Images were viewed by me  Right upper quadrant ultrasound IMPRESSION: **An incidental finding of potential clinical significance has been found. Dominant mass arising from the left lobe of the liver. Neoplastic etiology must be of concern given this appearance. Advise pre and post contrast CT or MR of the liver to further evaluate.**  CT of the abdomen and pelvis is pending at this time.  ____________________________________________  FINAL ASSESSMENT AND PLAN  Abdominal pain  Plan: Patient with labs and imaging as dictated above. CT scan is pending at this time. Final disposition will be made by Dr. Quentin Cornwall.    Earleen Newport, MD   Note: This dictation was prepared with Dragon dictation. Any transcriptional errors that result from this process are unintentional    Earleen Newport, MD 10/01/15 [phone]

## 2015-10-01 NOTE — ED Triage Notes (Signed)
Pt complains of RUQ abdominal pain that started 3-4 months ago and has become more intense over last couple of days. Pt states last night after eating the pain became a lot worse. Pt also complains of nausea. Pt states that pain does radiate into back.

## 2015-10-01 NOTE — ED Notes (Signed)
D/c inst to pt.  Iv d'ced.   

## 2015-10-01 NOTE — ED Provider Notes (Signed)
Patient reassessed remains asymptomatic. CT imaging does show evidence of large hemangioma. No evidence of acute rupture. As she is having some discomfort from this will refer her to [facility] for possible embolization.  Have discussed with the patient and available family all diagnostics and treatments performed thus far and all questions were answered to the best of my ability. The patient demonstrates understanding and agreement with plan.    Merlyn Lot, MD 10/01/15 [phone]

## 2015-10-08 DIAGNOSIS — R10811 Right upper quadrant abdominal tenderness: Secondary | ICD-10-CM | POA: Diagnosis not present

## 2015-10-08 DIAGNOSIS — R110 Nausea: Secondary | ICD-10-CM | POA: Diagnosis not present

## 2015-10-08 DIAGNOSIS — J019 Acute sinusitis, unspecified: Secondary | ICD-10-CM | POA: Diagnosis not present

## 2015-10-20 NOTE — Telephone Encounter (Signed)
Mel did you send her my chart message???

## 2015-10-28 DIAGNOSIS — R1011 Right upper quadrant pain: Secondary | ICD-10-CM | POA: Diagnosis not present

## 2015-10-28 DIAGNOSIS — K769 Liver disease, unspecified: Secondary | ICD-10-CM | POA: Diagnosis not present

## 2015-10-28 DIAGNOSIS — R110 Nausea: Secondary | ICD-10-CM | POA: Diagnosis not present

## 2015-10-29 DIAGNOSIS — K769 Liver disease, unspecified: Secondary | ICD-10-CM | POA: Diagnosis not present

## 2015-10-29 DIAGNOSIS — R1011 Right upper quadrant pain: Secondary | ICD-10-CM | POA: Insufficient documentation

## 2015-10-29 DIAGNOSIS — N200 Calculus of kidney: Secondary | ICD-10-CM | POA: Diagnosis not present

## 2015-11-12 ENCOUNTER — Ambulatory Visit (INDEPENDENT_AMBULATORY_CARE_PROVIDER_SITE_OTHER): Payer: 59 | Admitting: Obstetrics and Gynecology

## 2015-11-12 ENCOUNTER — Encounter: Payer: Self-pay | Admitting: Obstetrics and Gynecology

## 2015-11-12 VITALS — BP 118/68 | HR 86 | Ht 70.0 in | Wt 156.1 lb

## 2015-11-12 DIAGNOSIS — N9489 Other specified conditions associated with female genital organs and menstrual cycle: Secondary | ICD-10-CM

## 2015-11-12 DIAGNOSIS — R5383 Other fatigue: Secondary | ICD-10-CM | POA: Diagnosis not present

## 2015-11-12 MED ORDER — VITAMIN D (ERGOCALCIFEROL) 1.25 MG (50000 UNIT) PO CAPS: 50000.0000 [IU] | ORAL_CAPSULE | ORAL | 2 refills | Status: DC

## 2015-11-12 NOTE — Progress Notes (Signed)
Subjective:     Patient ID: Haley Wilkins, female   DOB: 17-Jun-1980, 35 y.o.   MRN: [id number]  HPI Here for follow up from ED visit on 10/01/15 at which time she was noted to have increased hemangioma of liver and left adnexal fullness with suspected pelvic congestion syndrome. She has been seen by specialist at [facility] for hemangioma and now is following up on this with [place]. She does reports occasional aching in LLQ with irregular spotting for menses and recent postcoital bleeding. Denies pain with sex, and still happy with Mirena IUD otherwise.  Second main concern is re-occurring fatigue, feels like her body is constantly fighting something. Feels like may be related to hemangioma and adnexal issue or vitamin deficiency of some kind. Reports days that she cannot even get out of bed and has no energy. Denies depression or other symptoms. Does have know Vit D deficiency. CT and abdominal ultrasound reviewed with patient at today's visit. Review of Systems Negative exppet stated in HPI    Objective:   Physical Exam A&O x4 Well groomed female in no distress Blood pressure 118/68, pulse 86, height 5\' 10"  (1.778 m), weight 156 lb 1.6 oz (70.8 kg). Thyroid without enlargement Abdomen soft and non-tender. Pelvic exam declined.    Assessment:     Fatigue Adnexal fullness noted on previous scan Postcoital bleeding Irregular menses secondary to IUD     Plan:     Labs obtained at patient request Will follow up accordingly To return in 2 week for ultrasound and consult with Dr Orlene Plum for pelvic congestion syndrome and treatment options if needed.  >50% of 20 minute visit spent in counseling.  Melody Fortuna, CNM

## 2015-11-13 LAB — MAGNESIUM: Magnesium: 2.1 mg/dL (ref 1.6–2.3)

## 2015-11-13 LAB — ESTRADIOL: Estradiol: 68.3 pg/mL

## 2015-11-13 LAB — CORTISOL: Cortisol: 7.4 ug/dL

## 2015-11-13 LAB — B12 AND FOLATE PANEL
Folate: 19.7 ng/mL (ref >3.0)
Vitamin B-12: 355 pg/mL (ref 211–946)

## 2015-11-13 LAB — PROGESTERONE: Progesterone: 6.2 ng/mL

## 2015-11-13 LAB — DHEA-SULFATE: DHEA-SO4: 118.1 ug/dL (ref 57.3–279.2)

## 2015-11-27 DIAGNOSIS — R41840 Attention and concentration deficit: Secondary | ICD-10-CM | POA: Diagnosis not present

## 2015-11-27 DIAGNOSIS — F331 Major depressive disorder, recurrent, moderate: Secondary | ICD-10-CM | POA: Diagnosis not present

## 2015-11-27 DIAGNOSIS — R10811 Right upper quadrant abdominal tenderness: Secondary | ICD-10-CM | POA: Diagnosis not present

## 2015-12-04 ENCOUNTER — Ambulatory Visit (INDEPENDENT_AMBULATORY_CARE_PROVIDER_SITE_OTHER): Payer: 59 | Admitting: Obstetrics and Gynecology

## 2015-12-04 ENCOUNTER — Ambulatory Visit (INDEPENDENT_AMBULATORY_CARE_PROVIDER_SITE_OTHER): Payer: 59

## 2015-12-04 ENCOUNTER — Encounter: Payer: Self-pay | Admitting: Obstetrics and Gynecology

## 2015-12-04 VITALS — BP 102/65 | HR 85 | Wt 155.0 lb

## 2015-12-04 DIAGNOSIS — Z30431 Encounter for routine checking of intrauterine contraceptive device: Secondary | ICD-10-CM | POA: Diagnosis not present

## 2015-12-04 DIAGNOSIS — R102 Pelvic and perineal pain: Secondary | ICD-10-CM | POA: Insufficient documentation

## 2015-12-04 DIAGNOSIS — N9489 Other specified conditions associated with female genital organs and menstrual cycle: Secondary | ICD-10-CM | POA: Diagnosis not present

## 2015-12-04 DIAGNOSIS — N946 Dysmenorrhea, unspecified: Secondary | ICD-10-CM | POA: Diagnosis not present

## 2015-12-04 DIAGNOSIS — Z98891 History of uterine scar from previous surgery: Secondary | ICD-10-CM

## 2015-12-04 HISTORY — DX: History of uterine scar from previous surgery: Z98.891

## 2015-12-04 NOTE — Patient Instructions (Addendum)
1. Laparoscopy with peritoneal biopsies is scheduled 2. Return for preoperative appointment week before surgery   Diagnostic Laparoscopy A diagnostic laparoscopy is a procedure to diagnose diseases in the abdomen. During the procedure, a thin, lighted, pencil-sized instrument called a laparoscope is inserted into the abdomen through an incision. The laparoscope allows your health care provider to look at the organs inside your body. LET [facility] CARE PROVIDER KNOW ABOUT:  Any allergies you have.  All medicines you are taking, including vitamins, herbs, eye drops, creams, and over-the-counter medicines.  Previous problems you or members of your family have had with the use of anesthetics.  Any blood disorders you have.  Previous surgeries you have had.  Medical conditions you have. RISKS AND COMPLICATIONS  Generally, this is a safe procedure. However, problems can occur, which may include:  Infection.  Bleeding.  Damage to other organs.  Allergic reaction to the anesthetics used during the procedure. BEFORE THE PROCEDURE  Do not eat or drink anything after midnight on the night before the procedure or as directed by your health care provider.  Ask your health care provider about:  Changing or stopping your regular medicines.  Taking medicines such as aspirin and ibuprofen. These medicines can thin your blood. Do not take these medicines before your procedure if your health care provider instructs you not to.  Plan to have someone take you home after the procedure. PROCEDURE  You may be given a medicine to help you relax (sedative).  You will be given a medicine to make you sleep (general anesthetic).  Your abdomen will be inflated with a gas. This will make your organs easier to see.  Small incisions will be made in your abdomen.  A laparoscope and other small instruments will be inserted into the abdomen through the incisions.  A tissue sample may be removed  from an organ in the abdomen for examination.  The instruments will be removed from the abdomen.  The gas will be released.  The incisions will be closed with stitches (sutures). AFTER THE PROCEDURE  Your blood pressure, heart rate, breathing rate, and blood oxygen level will be monitored often until the medicines you were given have worn off.   This information is not intended to replace advice given to you by your health care provider. Make sure you discuss any questions you have with your health care provider.   Document Released: 04/19/2000 Document Revised: 10/02/2014 Document Reviewed: 08/24/2013 Elsevier Interactive Patient Education [employer].   Endometriosis Endometriosis is a condition in which the tissue that lines the uterus (endometrium) grows outside of its normal location. The tissue may grow in many locations close to the uterus, but it commonly grows on the ovaries, fallopian tubes, vagina, or bowel. Because the uterus expels, or sheds, its lining every menstrual cycle, there is bleeding wherever the endometrial tissue is located. This can cause pain because blood is irritating to tissues not normally exposed to it.  CAUSES  The cause of endometriosis is not known.  SIGNS AND SYMPTOMS  Often, there are no symptoms. When symptoms are present, they can vary with the location of the displaced tissue. Various symptoms can occur at different times. Although symptoms occur mainly during a woman's menstrual period, they can also occur midcycle and usually stop with menopause. Some people may go months with no symptoms at all. Symptoms may include:   Back or abdominal pain.   Heavier bleeding during periods.   Pain during intercourse.  Painful bowel movements.   Infertility. DIAGNOSIS  Your health care provider will do a physical exam and ask about your symptoms. Various tests may be done, such as:   Blood tests and urine tests. These are done to help rule  out other problems.   Ultrasound. This test is done to look for abnormal tissue.   An X-ray of the lower bowel (barium enema).  Laparoscopy. In this procedure, a thin, lighted tube with a tiny camera on the end (laparoscope) is inserted into your abdomen. This helps your health care provider look for abnormal tissue to confirm the diagnosis. The health care provider may also remove a small piece of tissue (biopsy) from any abnormal tissue found. This tissue sample can then be sent to a lab so it can be looked at under a microscope. TREATMENT  Treatment will vary and may include:   Medicines to relieve pain. Nonsteroidal anti-inflammatory drugs (NSAIDs) are a type of pain medicine that can help to relieve the pain caused by endometriosis.  Hormonal therapy. When using hormonal therapy, periods are eliminated. This eliminates the monthly exposure to blood by the displaced endometrial tissue.   Surgery. Surgery may sometimes be done to remove the abnormal endometrial tissue. In severe cases, surgery may be done to remove the fallopian tubes, uterus, and ovaries (hysterectomy). HOME CARE INSTRUCTIONS   Take all medicines as directed by your health care provider. Do not take aspirin because it may increase bleeding when you are not on hormonal therapy.   Avoid activities that produce pain, including sexual activity. SEEK MEDICAL CARE IF:  You have pelvic pain before, after, or during your periods.  You have pelvic pain between periods that gets worse during your period.  You have pelvic pain during or after sex.  You have pelvic pain with bowel movements or urination, especially during your period.  You have problems getting pregnant.  You have a fever. SEEK IMMEDIATE MEDICAL CARE IF:   Your pain is severe and is not responding to pain medicine.   You have severe nausea and vomiting, or you cannot keep foods down.   You have pain that is limited to the right lower part of  your abdomen.   You have swelling or increasing pain in your abdomen.   You see blood in your stool.  MAKE SURE YOU:   Understand these instructions.  Will watch your condition.  Will get help right away if you are not doing well or get worse.   This information is not intended to replace advice given to you by your health care provider. Make sure you discuss any questions you have with your health care provider.   Document Released: 01/09/2000 Document Revised: 02/01/2014 Document Reviewed: 09/08/2012 Elsevier Interactive Patient Education [employer].

## 2015-12-04 NOTE — Progress Notes (Signed)
GYN ENCOUNTER NOTE  Subjective:       Haley Wilkins is a 35 y.o. G1P0 female is here for gynecologic evaluation of the following issues:  1.Pelvic congestion syndrome  Ultrasound from 10/01/2015 demonstrates bilateral pelvic varices consistent with pelvic congestion syndrome. Right ovary and left ovary are normal. Uterus is normal; endometrium measures 3.7 mm; IUD is noted to be within the uterus in appropriate position.  Patient has long history of left-sided pelvic pain. The pain feels like contractions and pressure and tends to come and go. Pain tends to be worse with standing and walking. She also experiences discomfort after sex with a postcoital ache. She also describes bilateral hip aching which tends to be worse with activity.  Patient has had current Mirena IUD for 9 months. She had 2 previous Mirena IUDs for a total of 9 years. The patient occasionally spots from the vagina but does not have any significant associated pelvic pain.  Significant past history is notable for severe dysmenorrhea prior to onset of Mirena use. Pain was characterized not only as central cramping but also low back ache with radiation into the buttocks and thighs. She has lost time from school because of the pain in the past and has used high dose nonsteroidal anti-inflammatory medication for attempted relief of symptoms. Patient states that she occasionally has difficulty with bowel movements around the time of menses. She also has noted urinary urgency and frequency during the time of her last menstrual cycle. She did not have a UTI that time.     Gynecologic History No LMP recorded. Patient is not currently having periods (Reason: IUD). Contraception: IUD Last [id number] high risk HPV Last mammogram: N/A  Obstetric History OB History  Gravida Para Term Preterm AB Living  1            SAB TAB Ectopic Multiple Live Births               # Outcome Date GA Lbr Len/2nd Weight Sex Delivery Anes PTL  Lv  1 Gravida 2005     CS-Unspec         Past Medical History:  Diagnosis Date  . Pelvic pain in female   . Raynaud's disease   . Rheumatoid arthritis ([place])   . Vaginal Pap smear, abnormal   . Vitamin B 12 deficiency     Past Surgical History:  Procedure Laterality Date  . CESAREAN SECTION    . LEEP  2006  . LITHOTRIPSY  2003    Current Outpatient Prescriptions on File Prior to Visit  Medication Sig Dispense Refill  . levonorgestrel (MIRENA) 20 MCG/24HR IUD 1 each by Intrauterine route once.    . lisdexamfetamine (VYVANSE) 40 MG capsule Take 2 capsules (80 mg total) by mouth daily. 60 capsule 0  . omeprazole (PRILOSEC) 40 MG capsule Take 40 mg by mouth 2 (two) times daily.    . ondansetron (ZOFRAN) 8 MG tablet Take by mouth every 8 (eight) hours as needed for nausea or vomiting.    . Vitamin D, Ergocalciferol, (DRISDOL) 50000 units CAPS capsule Take 1 capsule (50,000 Units total) by mouth every 7 (seven) days. 30 capsule 2  . potassium chloride (K-DUR,KLOR-CON) 10 MEQ tablet Take 10 mEq by mouth 2 (two) times daily.     No current facility-administered medications on file prior to visit.     No Known Allergies  Social History   Social History  . Marital status: Married    Spouse name: N/A  .  Number of children: N/A  . Years of education: N/A   Occupational History  . Not on file.   Social History Main Topics  . Smoking status: Never Smoker  . Smokeless tobacco: Never Used  . Alcohol use Yes  . Drug use: No  . Sexual activity: Yes    Birth control/ protection: IUD     Comment: mirena   Other Topics Concern  . Not on file   Social History Narrative  . No narrative on file    Family History  Problem Relation Age of Onset  . Diabetes Mother   . Diabetes Father   . Diabetes Maternal Grandfather     The following portions of the patient's history were reviewed and updated as appropriate: allergies, current medications, past family history, past medical  history, past social history, past surgical history and problem list.  Review of Systems Review of Systems - Per history of present illness  Objective:   BP 102/65 (BP Location: Right Arm, Patient Position: Sitting, Cuff Size: Large)   Pulse 85   Wt 155 lb (70.3 kg)   BMI 22.24 kg/m  CONSTITUTIONAL: Well-developed, well-nourished female in no acute distress.  HENT:  Normocephalic, atraumatic.  NECK: Normal range of motion, supple, no masses.  Normal thyroid.  SKIN: Skin is warm and dry. No rash noted. Not diaphoretic. No erythema. No pallor. [place]: Alert and oriented to person, place, and time. PSYCHIATRIC: Normal mood and affect. Normal behavior. Normal judgment and thought content. CARDIOVASCULAR:Not Examined RESPIRATORY: Not Examined BREASTS: Not Examined ABDOMEN: Soft, non distended; Non tender.  No Organomegaly.C-section scar healed PELVIC:  External Genitalia: Normal  BUS: Normal  Vagina: Normal  Cervix: IUD strings palpated; cervical motion tenderness 2/4  Uterus: Normal size, shape,consistency, mobile, anteverted, tender 2/4  Adnexa: Nonpalpable; left side tender 2/4, right side nontender  RV: Normal external exam  Bladder: Nontender MUSCULOSKELETAL: Normal range of motion. No tenderness.  No cyanosis, clubbing, or edema.     Assessment:   1. Pelvic congestion syndrome  2. Dysmenorrhea  3. Pelvic pain in female  4. History of cesarean section  5. Encounter for management of intrauterine contraceptive device (IUD), unspecified IUD management type  Besides pelvic congestion syndrome, the patient's symptomatology may also be related to endometriosis. If indeed endometriosis is identified, hysterectomy with left salpingo-oophorectomy and right salpingectomy may be the optimal treatment for management of symptoms. Laparoscopy is therefore recommended to rule out endometriosis. Should no pathology be identified at the time of laparoscopy, a trial of Depo-Lupron  therapy may also be attempted to see if this impacts her left sided pelvic pain and low back pain. If the patient has improvement in symptoms with this therapy, similar surgical management with hysterectomy left salpingo-oophorectomy and right salpingectomy may be indicated. To determine future management, patient will proceed with laparoscopy.     Plan:   1. Laparoscopy with peritoneal biopsies 2. Return for preoperative appointment prior procedure  A total of 25 minutes were spent face-to-face with the patient during this encounter and over half of that time involved counseling and coordination of care.  Brayton Mars, MD  Note: This dictation was prepared with Dragon dictation along with smaller phrase technology. Any transcriptional errors that result from this process are unintentional.

## 2015-12-10 DIAGNOSIS — L821 Other seborrheic keratosis: Secondary | ICD-10-CM | POA: Diagnosis not present

## 2015-12-10 DIAGNOSIS — D485 Neoplasm of uncertain behavior of skin: Secondary | ICD-10-CM | POA: Diagnosis not present

## 2015-12-10 DIAGNOSIS — D235 Other benign neoplasm of skin of trunk: Secondary | ICD-10-CM | POA: Diagnosis not present

## 2015-12-10 DIAGNOSIS — C44619 Basal cell carcinoma of skin of left upper limb, including shoulder: Secondary | ICD-10-CM | POA: Diagnosis not present

## 2015-12-10 DIAGNOSIS — D0472 Carcinoma in situ of skin of left lower limb, including hip: Secondary | ICD-10-CM | POA: Diagnosis not present

## 2015-12-22 DIAGNOSIS — D0472 Carcinoma in situ of skin of left lower limb, including hip: Secondary | ICD-10-CM | POA: Diagnosis not present

## 2015-12-22 DIAGNOSIS — C44619 Basal cell carcinoma of skin of left upper limb, including shoulder: Secondary | ICD-10-CM | POA: Diagnosis not present

## 2015-12-23 ENCOUNTER — Inpatient Hospital Stay: Admission: RE | Admit: 2015-12-23 | Payer: 59 | Source: Ambulatory Visit

## 2015-12-23 ENCOUNTER — Encounter: Payer: 59 | Admitting: Obstetrics and Gynecology

## 2015-12-25 DIAGNOSIS — L03116 Cellulitis of left lower limb: Secondary | ICD-10-CM | POA: Diagnosis not present

## 2015-12-29 DIAGNOSIS — L03116 Cellulitis of left lower limb: Secondary | ICD-10-CM | POA: Diagnosis not present

## 2016-01-27 ENCOUNTER — Inpatient Hospital Stay: Admission: RE | Admit: 2016-01-27 | Payer: 59 | Source: Ambulatory Visit

## 2016-01-27 ENCOUNTER — Encounter: Payer: 59 | Admitting: Obstetrics and Gynecology

## 2016-02-02 ENCOUNTER — Encounter: Admission: RE | Payer: Self-pay | Source: Ambulatory Visit

## 2016-02-02 ENCOUNTER — Encounter: Payer: Self-pay | Admitting: Physician Assistant

## 2016-02-02 ENCOUNTER — Ambulatory Visit: Payer: Self-pay | Admitting: Physician Assistant

## 2016-02-02 ENCOUNTER — Ambulatory Visit: Admission: RE | Admit: 2016-02-02 | Payer: 59 | Source: Ambulatory Visit | Admitting: Obstetrics and Gynecology

## 2016-02-02 VITALS — BP 122/80 | HR 88 | Temp 98.6°F

## 2016-02-02 DIAGNOSIS — J101 Influenza due to other identified influenza virus with other respiratory manifestations: Secondary | ICD-10-CM

## 2016-02-02 LAB — POCT INFLUENZA A/B
Influenza A, POC: POSITIVE — AB
Influenza B, POC: NEGATIVE

## 2016-02-02 SURGERY — LAPAROSCOPY, DIAGNOSTIC
Anesthesia: General

## 2016-02-02 MED ORDER — OSELTAMIVIR PHOSPHATE 75 MG PO CAPS: 75.0000 mg | ORAL_CAPSULE | Freq: Two times a day (BID) | ORAL | 0 refills | Status: DC

## 2016-02-02 NOTE — Progress Notes (Signed)
S: C/o runny nose and congestion with dry cough for02- 3 days, + fever, chills, denies cp/sob, v/d; mucus was  clear throughout the day, cough is sporadic, some body aches, son has same sx,   Using otc meds: tylenol cold  O: PE: vitals wnl, nad,  perrl eomi, normocephalic, tms dull, nasal mucosa red and swollen, throat injected, neck supple no lymph, lungs c t a, cv rrr, neuro intact, flu swab +A  A:  Acute influenza A   P: drink fluids, continue regular meds , use otc meds of choice, return if not improving in 5 days, return earlier if worsening tamiflu 75 bid x 5d, work note given as cannot work until has not had a fever for 24-48 hours, rx for [employer], Legrand Como [employer] for preventive tamiflu

## 2016-03-04 DIAGNOSIS — F900 Attention-deficit hyperactivity disorder, predominantly inattentive type: Secondary | ICD-10-CM | POA: Diagnosis not present

## 2016-03-04 DIAGNOSIS — E039 Hypothyroidism, unspecified: Secondary | ICD-10-CM | POA: Diagnosis not present

## 2016-03-04 DIAGNOSIS — F329 Major depressive disorder, single episode, unspecified: Secondary | ICD-10-CM | POA: Diagnosis not present

## 2016-03-04 DIAGNOSIS — R5383 Other fatigue: Secondary | ICD-10-CM | POA: Diagnosis not present

## 2016-03-05 ENCOUNTER — Encounter: Payer: 59 | Admitting: Obstetrics and Gynecology

## 2016-04-15 ENCOUNTER — Other Ambulatory Visit: Payer: Self-pay | Admitting: Obstetrics and Gynecology

## 2016-04-15 ENCOUNTER — Encounter: Payer: Self-pay | Admitting: Obstetrics and Gynecology

## 2016-04-15 ENCOUNTER — Ambulatory Visit (INDEPENDENT_AMBULATORY_CARE_PROVIDER_SITE_OTHER): Payer: 59 | Admitting: Obstetrics and Gynecology

## 2016-04-15 VITALS — BP 109/60 | HR 81 | Ht 69.0 in | Wt 160.3 lb

## 2016-04-15 DIAGNOSIS — R61 Generalized hyperhidrosis: Secondary | ICD-10-CM | POA: Diagnosis not present

## 2016-04-15 DIAGNOSIS — Z01419 Encounter for gynecological examination (general) (routine) without abnormal findings: Secondary | ICD-10-CM | POA: Diagnosis not present

## 2016-04-15 DIAGNOSIS — R102 Pelvic and perineal pain: Secondary | ICD-10-CM | POA: Diagnosis not present

## 2016-04-15 MED ORDER — IBUPROFEN 800 MG PO TABS: 800.0000 mg | ORAL_TABLET | Freq: Three times a day (TID) | ORAL | 1 refills | Status: DC | PRN

## 2016-04-15 MED ORDER — FLUOXETINE HCL 10 MG PO TABS: 10.0000 mg | ORAL_TABLET | Freq: Every day | ORAL | 6 refills | Status: DC

## 2016-04-15 NOTE — Progress Notes (Signed)
Subjective:   Haley Wilkins is a 36 y.o. G61P0 Caucasian female here for a routine well-woman exam.  No LMP recorded. Patient is not currently having periods (Reason: IUD).    Current complaints: night sweats getting worse, almost every night, spotting once a month since mirena placed. Constipation worse-Miralax helps.   States decided against exploratory lap due to insurance coverage and cost.   PCP: Junius Creamer       does desire labs  Social History: Sexual: heterosexual Marital Status: married Living situation: with spouse Occupation: [employer]  Tobacco/alcohol: no tobacco use Illicit drugs: no history of illicit drug use  The following portions of the patient's history were reviewed and updated as appropriate: allergies, current medications, past family history, past medical history, past social history, past surgical history and problem list.  Past Medical History Past Medical History:  Diagnosis Date  . Pelvic pain in female   . Raynaud's disease   . Rheumatoid arthritis ([place])   . Vaginal Pap smear, abnormal   . Vitamin B 12 deficiency     Past Surgical History Past Surgical History:  Procedure Laterality Date  . CESAREAN SECTION    . LEEP  2006  . LITHOTRIPSY  2003    Gynecologic History G1P0  No LMP recorded. Patient is not currently having periods (Reason: IUD). Contraception: IUD Last Pap: 2017. Results were: abnormal, HPV+   Obstetric History OB History  Gravida Para Term Preterm AB Living  1            SAB TAB Ectopic Multiple Live Births               # Outcome Date GA Lbr Len/2nd Weight Sex Delivery Anes PTL Lv  1 Gravida 2005     CS-Unspec         Current Medications Current Outpatient Prescriptions on File Prior to Visit  Medication Sig Dispense Refill  . levonorgestrel (MIRENA) 20 MCG/24HR IUD 1 each by Intrauterine route once.    . lisdexamfetamine (VYVANSE) 40 MG capsule Take 2 capsules (80 mg total) by mouth daily. 60 capsule 0  . omeprazole  (PRILOSEC) 40 MG capsule Take 40 mg by mouth 2 (two) times daily.    . ondansetron (ZOFRAN) 8 MG tablet Take by mouth every 8 (eight) hours as needed for nausea or vomiting.    . Vitamin D, Ergocalciferol, (DRISDOL) 50000 units CAPS capsule Take 1 capsule (50,000 Units total) by mouth every 7 (seven) days. 30 capsule 2  . FLUoxetine (PROZAC) 10 MG tablet Take 10 mg by mouth daily.    . potassium chloride (K-DUR,KLOR-CON) 10 MEQ tablet Take 10 mEq by mouth 2 (two) times daily.     No current facility-administered medications on file prior to visit.     Review of Systems Patient denies any headaches, blurred vision, shortness of breath, chest pain, abdominal pain, problems with bowel movements, urination, or intercourse.  Objective:  BP 109/60   Pulse 81   Ht 5\' 9"  (1.753 m)   Wt 160 lb 4.8 oz (72.7 kg)   BMI 23.67 kg/m  Physical Exam  General:  Well developed, well nourished, no acute distress. She is alert and oriented x3. Skin:  Warm and dry Neck:  Midline trachea, no thyromegaly or nodules Cardiovascular: Regular rate and rhythm, no murmur heard Lungs:  Effort normal, all lung fields clear to auscultation bilaterally Breasts:  No dominant palpable mass, retraction, or nipple discharge Abdomen:  Soft, non tender, no hepatosplenomegaly or masses Pelvic:  External genitalia is normal in appearance.  The vagina is normal in appearance. The cervix is bulbous, no CMT, IUD string noted and scant red menstrual bleeding.  Thin prep pap is done with HR HPV cotesting. Uterus is felt to be normal size, shape, and contour.  No adnexal masses or tenderness noted. Extremities:  No swelling or varicosities noted Psych:  She has a normal mood and affect  Assessment:   Healthy well-woman exam Night sweats Pelvic pain(not new)  Plan:  Labs obtained Reiterated normal findings on exam. F/U 1 year for Ae, or sooner if needed   Charo Philipp Rockney Ghee, CNM

## 2016-04-15 NOTE — Patient Instructions (Addendum)
Thank you for enrolling in [place]. Please follow the instructions below to securely access your online medical record. MyChart allows you to send messages to your doctor, view your test results, renew your prescriptions, schedule appointments, and more.  How Do I Sign Up? 1. In your Internet browser, go to [email or web address]. 2. Click on the New  User? link in the Sign In box.  3. Enter your MyChart Access Code exactly as it appears below. You will not need to use this code after you have completed the sign-up process. If you do not sign up before the expiration date, you must request a new code. MyChart Access Code: Activation code not generated Current MyChart Status: Active  4. Enter the last four digits of your Social Security Number (xxxx) and Date of Birth (mm/dd/yyyy) as indicated and click Next. You will be taken to the next sign-up page. 5. Create a MyChart ID. This will be your MyChart login ID and cannot be changed, so think of one that is secure and easy to remember. 6. Create a MyChart password. You can change your password at any time. 7. Enter your Password Reset Question and Answer and click Next. This can be used at a later time if you forget your password.  8. Select your communication preference, and if applicable enter your e-mail address. You will receive e-mail notification when new information is available in MyChart by choosing to receive e-mail notifications and filling in your e-mail. 9. Click Sign In. You can now view your medical record.   Additional Information If you have questions, you can email [email or web address] or call [phone] to talk to our [facility] staff. Remember, MyChart is NOT to be used for urgent needs. For medical emergencies, dial 911.     Preventive Care 18-39 Years, Female Preventive care refers to lifestyle choices and visits with your health care provider that can promote health and wellness. What does  preventive care include?  A yearly physical exam. This is also called an annual well check.  Dental exams once or twice a year.  Routine eye exams. Ask your health care provider how often you should have your eyes checked.  Personal lifestyle choices, including:  Daily care of your teeth and gums.  Regular physical activity.  Eating a healthy diet.  Avoiding tobacco and drug use.  Limiting alcohol use.  Practicing safe sex.  Taking vitamin and mineral supplements as recommended by your health care provider. What happens during an annual well check? The services and screenings done by your health care provider during your annual well check will depend on your age, overall health, lifestyle risk factors, and family history of disease. Counseling  Your health care provider may ask you questions about your:  Alcohol use.  Tobacco use.  Drug use.  Emotional well-being.  Home and relationship well-being.  Sexual activity.  Eating habits.  Work and work Statistician.  Method of birth control.  Menstrual cycle.  Pregnancy history. Screening  You may have the following tests or measurements:  Height, weight, and BMI.  Diabetes screening. This is done by checking your blood sugar (glucose) after you have not eaten for a while (fasting).  Blood pressure.  Lipid and cholesterol levels. These may be checked every 5 years starting at age 59.  Skin check.  Hepatitis C blood test.  Hepatitis B blood test.  Sexually transmitted disease (STD) testing.  BRCA-related cancer screening. This may be done if you have a family  history of breast, ovarian, tubal, or peritoneal cancers.  Pelvic exam and Pap test. This may be done every 3 years starting at age 13. Starting at age 76, this may be done every 5 years if you have a Pap test in combination with an HPV test. Discuss your test results, treatment options, and if necessary, the need for more tests with your health  care provider. Vaccines  Your health care provider may recommend certain vaccines, such as:  Influenza vaccine. This is recommended every year.  Tetanus, diphtheria, and acellular pertussis (Tdap, Td) vaccine. You may need a Td booster every 10 years.  Varicella vaccine. You may need this if you have not been vaccinated.  HPV vaccine. If you are 74 or younger, you may need three doses over 6 months.  Measles, mumps, and rubella (MMR) vaccine. You may need at least one dose of MMR. You may also need a second dose.  Pneumococcal 13-valent conjugate (PCV13) vaccine. You may need this if you have certain conditions and were not previously vaccinated.  Pneumococcal polysaccharide (PPSV23) vaccine. You may need one or two doses if you smoke cigarettes or if you have certain conditions.  Meningococcal vaccine. One dose is recommended if you are age 67-21 years and a first-year college student living in a residence hall, or if you have one of several medical conditions. You may also need additional booster doses.  Hepatitis A vaccine. You may need this if you have certain conditions or if you travel or work in places where you may be exposed to hepatitis A.  Hepatitis B vaccine. You may need this if you have certain conditions or if you travel or work in places where you may be exposed to hepatitis B.  Haemophilus influenzae type b (Hib) vaccine. You may need this if you have certain risk factors. Talk to your health care provider about which screenings and vaccines you need and how often you need them. This information is not intended to replace advice given to you by your health care provider. Make sure you discuss any questions you have with your health care provider. Document Released: 03/09/2001 Document Revised: 10/01/2015 Document Reviewed: 11/12/2014 Elsevier Interactive Patient Education  2017 [employer].

## 2016-04-19 LAB — CBC
Hematocrit: 42.1 % (ref 34.0–46.6)
Hemoglobin: 13.3 g/dL (ref 11.1–15.9)
MCH: 30.5 pg (ref 26.6–33.0)
MCHC: 31.6 g/dL (ref 31.5–35.7)
MCV: 97 fL (ref 79–97)
Platelets: 221 10*3/uL (ref 150–379)
RBC: 4.36 x10E6/uL (ref 3.77–5.28)
RDW: 13 % (ref 12.3–15.4)
WBC: 4.5 10*3/uL (ref 3.4–10.8)

## 2016-04-19 LAB — PROGESTERONE: Progesterone: 4.7 ng/mL

## 2016-04-19 LAB — THYROID PANEL WITH TSH
Free Thyroxine Index: 1.8 (ref 1.2–4.9)
T3 Uptake Ratio: 27 % (ref 24–39)
T4, Total: 6.7 ug/dL (ref 4.5–12.0)
TSH: 1.87 u[IU]/mL (ref 0.450–4.500)

## 2016-04-19 LAB — LIPID PANEL
Chol/HDL Ratio: 2.2 ratio units (ref 0.0–4.4)
Cholesterol, Total: 158 mg/dL (ref 100–199)
HDL: 73 mg/dL (ref >39)
LDL Calculated: 74 mg/dL (ref 0–99)
Triglycerides: 53 mg/dL (ref 0–149)
VLDL Cholesterol Cal: 11 mg/dL (ref 5–40)

## 2016-04-19 LAB — COMPREHENSIVE METABOLIC PANEL
ALT: 12 IU/L (ref 0–32)
AST: 26 IU/L (ref 0–40)
Albumin/Globulin Ratio: 1.4 (ref 1.2–2.2)
Albumin: 4.8 g/dL (ref 3.5–5.5)
Alkaline Phosphatase: 61 IU/L (ref 39–117)
BUN/Creatinine Ratio: 11 (ref 9–23)
BUN: 11 mg/dL (ref 6–20)
Bilirubin Total: 0.4 mg/dL (ref 0.0–1.2)
CO2: 16 mmol/L — ABNORMAL LOW (ref 18–29)
Calcium: 9.5 mg/dL (ref 8.7–10.2)
Chloride: 106 mmol/L (ref 96–106)
Creatinine, Ser: 0.97 mg/dL (ref 0.57–1.00)
GFR calc Af Amer: 86 mL/min/{1.73_m2} (ref >59)
GFR calc non Af Amer: 75 mL/min/{1.73_m2} (ref >59)
Globulin, Total: 3.5 g/dL (ref 1.5–4.5)
Glucose: 90 mg/dL (ref 65–99)
Potassium: 4.1 mmol/L (ref 3.5–5.2)
Sodium: 145 mmol/L — ABNORMAL HIGH (ref 134–144)
Total Protein: 8.3 g/dL (ref 6.0–8.5)

## 2016-04-19 LAB — B12 AND FOLATE PANEL
Folate: 19.4 ng/mL (ref >3.0)
Vitamin B-12: 588 pg/mL (ref 232–1245)

## 2016-04-19 LAB — ESTRADIOL: Estradiol: 52.3 pg/mL

## 2016-04-19 LAB — VITAMIN D 25 HYDROXY (VIT D DEFICIENCY, FRACTURES): Vit D, 25-Hydroxy: 35.5 ng/mL (ref 30.0–100.0)

## 2016-04-19 LAB — FERRITIN: Ferritin: 85 ng/mL (ref 15–150)

## 2016-04-19 LAB — FOLLICLE STIMULATING HORMONE: FSH: 3.3 m[IU]/mL

## 2016-04-21 LAB — CYTOLOGY - PAP

## 2016-04-28 ENCOUNTER — Other Ambulatory Visit: Payer: Self-pay | Admitting: Obstetrics and Gynecology

## 2016-04-28 MED ORDER — CLINDAMYCIN PHOSPHATE 2 % VA CREA: 1.0000 | TOPICAL_CREAM | Freq: Every day | VAGINAL | 0 refills | Status: DC

## 2016-05-24 ENCOUNTER — Encounter: Payer: Self-pay | Admitting: Obstetrics and Gynecology

## 2016-05-31 DIAGNOSIS — E039 Hypothyroidism, unspecified: Secondary | ICD-10-CM | POA: Diagnosis not present

## 2016-05-31 DIAGNOSIS — R41840 Attention and concentration deficit: Secondary | ICD-10-CM | POA: Diagnosis not present

## 2016-06-01 ENCOUNTER — Other Ambulatory Visit: Payer: Self-pay | Admitting: Obstetrics and Gynecology

## 2016-06-01 MED ORDER — LINACLOTIDE 145 MCG PO CAPS: 145.0000 ug | ORAL_CAPSULE | Freq: Every day | ORAL | 2 refills | Status: AC

## 2016-06-18 ENCOUNTER — Ambulatory Visit: Payer: Self-pay | Admitting: Physician Assistant

## 2016-06-18 VITALS — BP 100/70 | HR 80 | Temp 98.6°F

## 2016-06-18 DIAGNOSIS — R300 Dysuria: Secondary | ICD-10-CM

## 2016-06-18 LAB — POCT URINALYSIS DIPSTICK
Bilirubin, UA: NEGATIVE
Blood, UA: NEGATIVE
Glucose, UA: NEGATIVE
Ketones, UA: NEGATIVE
Nitrite, UA: NEGATIVE
Protein, UA: NEGATIVE
Spec Grav, UA: 1.01 (ref 1.010–1.025)
Urobilinogen, UA: 0.2 E.U./dL
pH, UA: 6 (ref 5.0–8.0)

## 2016-06-18 MED ORDER — FLUOXETINE HCL 10 MG PO TABS: 10.0000 mg | ORAL_TABLET | Freq: Every day | ORAL | 6 refills | Status: DC

## 2016-06-18 MED ORDER — SULFAMETHOXAZOLE-TRIMETHOPRIM 800-160 MG PO TABS: 1.0000 | ORAL_TABLET | Freq: Two times a day (BID) | ORAL | 0 refills | Status: DC

## 2016-06-18 NOTE — Progress Notes (Signed)
S:  Has area on left shoulder that is concerning.  Has been sore for several days and red.  Had skin ca cut from that area in Nov. 2017.  Tender and red.  Felt feverish last pm.  Took tylenol with some improvement.  No drainage.   O:  Red and tender.  ? Cyst formation at prox end of scar.  No drainage A:  Skin eruption P:  Septra DS x 10 days and warm compresses.  See dermatologist if any continued problems

## 2016-08-09 DIAGNOSIS — E039 Hypothyroidism, unspecified: Secondary | ICD-10-CM | POA: Diagnosis not present

## 2016-08-09 DIAGNOSIS — R41840 Attention and concentration deficit: Secondary | ICD-10-CM | POA: Diagnosis not present

## 2016-08-09 DIAGNOSIS — B373 Candidiasis of vulva and vagina: Secondary | ICD-10-CM | POA: Diagnosis not present

## 2016-08-09 DIAGNOSIS — R5383 Other fatigue: Secondary | ICD-10-CM | POA: Diagnosis not present

## 2016-10-12 ENCOUNTER — Other Ambulatory Visit: Payer: Self-pay | Admitting: Obstetrics and Gynecology

## 2016-11-08 DIAGNOSIS — F900 Attention-deficit hyperactivity disorder, predominantly inattentive type: Secondary | ICD-10-CM | POA: Diagnosis not present

## 2016-11-08 DIAGNOSIS — M545 Low back pain: Secondary | ICD-10-CM | POA: Diagnosis not present

## 2016-11-08 DIAGNOSIS — M5442 Lumbago with sciatica, left side: Secondary | ICD-10-CM | POA: Diagnosis not present

## 2016-12-10 ENCOUNTER — Emergency Department: Payer: 59

## 2016-12-10 ENCOUNTER — Emergency Department
Admission: EM | Admit: 2016-12-10 | Discharge: 2016-12-10 | Disposition: A | Discharge status: Home / Self Care | Payer: 59 | Attending: Emergency Medicine | Admitting: Emergency Medicine

## 2016-12-10 ENCOUNTER — Other Ambulatory Visit: Payer: Self-pay

## 2016-12-10 ENCOUNTER — Encounter: Payer: Self-pay | Admitting: Emergency Medicine

## 2016-12-10 DIAGNOSIS — M79605 Pain in left leg: Secondary | ICD-10-CM | POA: Diagnosis not present

## 2016-12-10 DIAGNOSIS — M7989 Other specified soft tissue disorders: Secondary | ICD-10-CM | POA: Diagnosis not present

## 2016-12-10 DIAGNOSIS — M25562 Pain in left knee: Secondary | ICD-10-CM | POA: Diagnosis not present

## 2016-12-10 NOTE — ED Provider Notes (Signed)
[facility] Emergency Department Provider Note       Time seen: ----------------------------------------- 3:53 PM on 12/10/2016 -----------------------------------------    I have reviewed the triage vital signs and the nursing notes.  HISTORY   Chief Complaint Leg Pain and Leg Swelling    HPI Haley Wilkins is a 36 y.o. female with a history of pelvic pain who presents to the ED for left leg pain and swelling.  Patient was recently treated by her primary care doctor for sciatica in the left leg.  She was given prednisone, muscle relaxants and pain medicine.  She reports left leg continues to hurt and feels swollen.  She was sent here to be evaluated for DVT.  Pain is 4 out of 10 in the left leg.  Past Medical History:  Diagnosis Date  . Pelvic pain in female   . Raynaud's disease   . Rheumatoid arthritis ([place])   . Vaginal Pap smear, abnormal   . Vitamin B 12 deficiency     Patient Active Problem List   Diagnosis Date Noted  . History of cesarean section 12/04/2015  . Pelvic pain in female 12/04/2015  . Dysmenorrhea 12/04/2015  . Pelvic congestion syndrome 12/04/2015  . ASCUS with positive high risk HPV 03/06/2015  . Fatigue due to treatment 02/28/2015  . Encounter for management of intrauterine contraceptive device (IUD) 02/28/2015    Past Surgical History:  Procedure Laterality Date  . CESAREAN SECTION    . LEEP  2006  . LITHOTRIPSY  2003    Allergies Doxycycline  Social History Social History   Tobacco Use  . Smoking status: Never Smoker  . Smokeless tobacco: Never Used  Substance Use Topics  . Alcohol use: Yes  . Drug use: No    Review of Systems Constitutional: Negative for fever. Cardiovascular: Negative for chest pain. Respiratory: Negative for shortness of breath. Gastrointestinal: Negative for abdominal pain, vomiting and diarrhea. Musculoskeletal: Positive for left leg pain Skin: Negative for rash. Neurological:  Negative for headaches, focal weakness or numbness.  All systems negative/normal/unremarkable except as stated in the HPI  ____________________________________________   PHYSICAL EXAM:  VITAL SIGNS: ED Triage Vitals  Enc Vitals Group     BP 12/10/16 1340 (!) 144/59     Pulse Rate 12/10/16 1340 (!) 106     Resp 12/10/16 1340 16     Temp 12/10/16 1340 98.7 F (37.1 C)     Temp Source 12/10/16 1340 Oral     SpO2 12/10/16 1340 100 %     Weight 12/10/16 1341 155 lb (70.3 kg)     Height 12/10/16 1341 5\' 9"  (1.753 m)     Head Circumference --      Peak Flow --      Pain Score 12/10/16 1341 4     Pain Loc --      Pain Edu? --      Excl. in [place]? --    Constitutional: Alert and oriented. Well appearing and in no distress. Eyes: Conjunctivae are normal. Normal extraocular movements. Musculoskeletal: Pain with range of motion of the left knee, no palpable abnormality.  Skin appears normal, no obvious edema. Neurologic:  Normal speech and language. No gross focal neurologic deficits are appreciated.  Skin:  Skin is warm, dry and intact. No rash noted. Psychiatric: Mood and affect are normal. Speech and behavior are normal.  ____________________________________________  ED COURSE:  Pertinent labs & imaging results that were available during my care of the patient were  reviewed by me and considered in my medical decision making (see chart for details). Patient presents for left leg pain, we will assess with imaging as indicated.   Procedures ____________________________________________   RADIOLOGY  Left lower extremity ultrasound is negative for DVT  ____________________________________________  DIFFERENTIAL DIAGNOSIS   DVT, peripheral edema, muscle strain  FINAL ASSESSMENT AND PLAN  Leg pain   Plan: Patient had presented for leg pain which is likely sciatica related or secondary to muscle strain or arthritis. Patient's imaging was unremarkable for DVT.  Have advised she  may need an MRI of her lumbar spine.  She is stable for outpatient follow-up.   Earleen Newport, MD   Note: This note was generated in part or whole with voice recognition software. Voice recognition is usually quite accurate but there are transcription errors that can and very often do occur. I apologize for any typographical errors that were not detected and corrected.     Earleen Newport, MD 12/10/16 [phone]

## 2016-12-10 NOTE — ED Triage Notes (Signed)
Recently treated for sciatica by PCP.  Given Prednisone, muscle relaxers, pain medication.  Leg continues to hurt, left leg swollen, and leg started to feel warm to touch at times.  Patient here to be seen to be evaluated for DVT.

## 2017-01-13 ENCOUNTER — Other Ambulatory Visit: Payer: Self-pay | Admitting: Nurse Practitioner

## 2017-01-13 DIAGNOSIS — F411 Generalized anxiety disorder: Secondary | ICD-10-CM

## 2017-01-13 MED ORDER — ALPRAZOLAM 0.25 MG PO TABS: 0.2500 mg | ORAL_TABLET | Freq: Every day | ORAL | 2 refills | Status: DC | PRN

## 2017-01-13 NOTE — Progress Notes (Signed)
Approved alprazolam 0.25mg  #30 with 2 refills and sent to pharmacy.

## 2017-01-21 ENCOUNTER — Ambulatory Visit: Payer: Self-pay | Admitting: Nurse Practitioner

## 2017-01-21 VITALS — BP 120/80 | HR 89 | Temp 98.6°F | Resp 16

## 2017-01-21 DIAGNOSIS — J0100 Acute maxillary sinusitis, unspecified: Secondary | ICD-10-CM

## 2017-01-21 MED ORDER — AMOXICILLIN-POT CLAVULANATE 875-125 MG PO TABS: 1.0000 | ORAL_TABLET | Freq: Two times a day (BID) | ORAL | 0 refills | Status: AC

## 2017-01-21 MED ORDER — FLUTICASONE PROPIONATE 50 MCG/ACT NA SUSP: 2.0000 | Freq: Every day | NASAL | 0 refills | Status: DC

## 2017-01-21 NOTE — Progress Notes (Signed)
Subjective:  Haley Wilkins is a 36 y.o. female who presents for evaluation of possible sinusitis.  Symptoms include facial pain, lightheadedness, nasal congestion, post nasal drip, sinus pressure, sneezing and sore throat.  Onset of symptoms was 2 days ago, and has been gradually worsening since that time.  Treatment to date:  decongestants and ibuprofen.  High risk factors for influenza complications:  none.  The following portions of the patient's history were reviewed and updated as appropriate:  allergies, current medications and past medical history.  Constitutional: positive for chills, fatigue and fevers Eyes: negative Ears, nose, mouth, throat, and face: positive for nasal congestion, sore throat and feeling of fullness in ears, negative for hoarseness and sore mouth Respiratory: negative Cardiovascular: negative Neurological: positive for headaches and lightheadedness Behavioral/Psych: negative Objective:  BP 120/80   Pulse 89   Temp 98.6 F (37 C) (Oral)   Resp 16   SpO2 100%  General appearance: alert, cooperative and fatigued Head: Normocephalic, without obvious abnormality, atraumatic Eyes: conjunctivae/corneas clear. PERRL, EOM's intact. Fundi benign. Ears: abnormal TM right ear - mucoid middle ear fluid and abnormal TM left ear - mucoid middle ear fluid Nose: Nares normal. Septum midline. Mucosa normal. No drainage or sinus tenderness., turbinates swollen, edematous, moderate maxillary sinus tenderness bilateral, no frontal sinus tenderness bilateral Throat: lips, mucosa, and tongue normal; teeth and gums normal Lungs: clear to auscultation bilaterally Heart: regular rate and rhythm, S1, S2 normal, no murmur, click, rub or gallop Pulses: 2+ and symmetric Skin: Skin color, texture, turgor normal. No rashes or lesions Lymph nodes: Cervical, supraclavicular, and axillary nodes normal. and cervical and submandibular nodes present    Assessment:  sinusitis    Plan:   Discussed diagnosis and treatment of sinusitis. Suggested symptomatic OTC remedies. Supportive care with appropriate antipyretics and fluids. Nasal saline spray for congestion. Augmentin per orders. Nasal steroids per orders. Follow up as needed. Flonase as directed.  Increase fluids, rest.

## 2017-02-07 ENCOUNTER — Ambulatory Visit: Payer: Self-pay | Admitting: Nurse Practitioner

## 2017-02-16 DIAGNOSIS — Z6823 Body mass index (BMI) 23.0-23.9, adult: Secondary | ICD-10-CM | POA: Diagnosis not present

## 2017-02-16 DIAGNOSIS — F988 Other specified behavioral and emotional disorders with onset usually occurring in childhood and adolescence: Secondary | ICD-10-CM | POA: Diagnosis not present

## 2017-02-16 DIAGNOSIS — Z1331 Encounter for screening for depression: Secondary | ICD-10-CM | POA: Diagnosis not present

## 2017-02-20 IMAGING — CT CT ABD-PELV W/ CM
2 of 5 series · 14 of 46 positions shown, 16 images · IV contrast (iopamidol)
Comparison: Ultrasound 10/01/2015 and CT scan abdomen and pelvis
12/15/2006

CLINICAL DATA: Mid abdominal pain, incidental liver lesion on today
ultrasound

EXAM:
CT ABDOMEN AND PELVIS WITH CONTRAST
TECHNIQUE: Multidetector CT imaging of the abdomen and pelvis was performed
using the standard protocol following bolus administration of
intravenous contrast.
CONTRAST:  100mL 14KGLF-U00 IOPAMIDOL (14KGLF-U00) INJECTION 61%

[Series 2: axial st · axial · 0.61mm/px · z∈[-876,-501]mm · 11 of 85 slices shown, 13 images]
[im 5/85  soft-tissue]
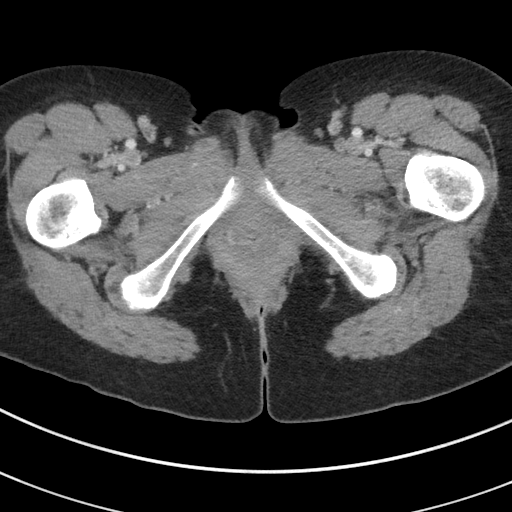
[im 5/85  bone]
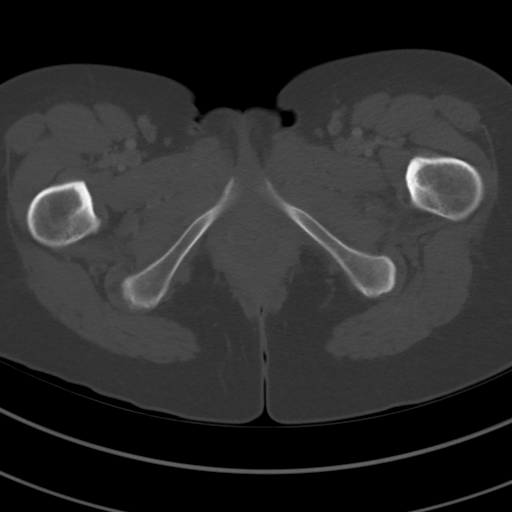
[im 15/85  soft-tissue]
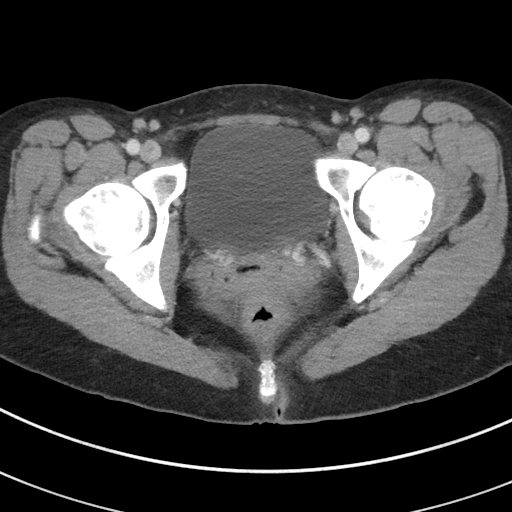
[im 20/85  soft-tissue]
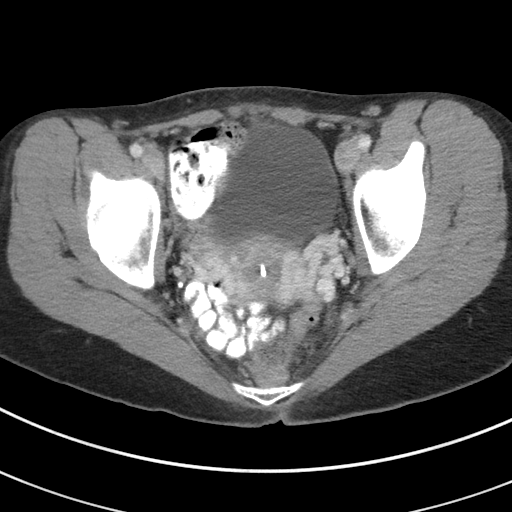
[im 30/85  soft-tissue]
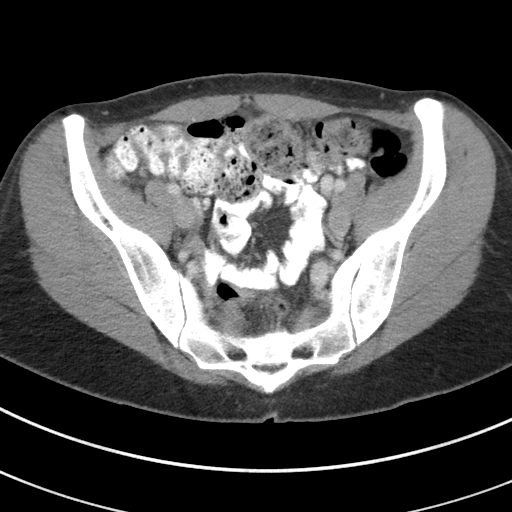
[im 35/85  soft-tissue]
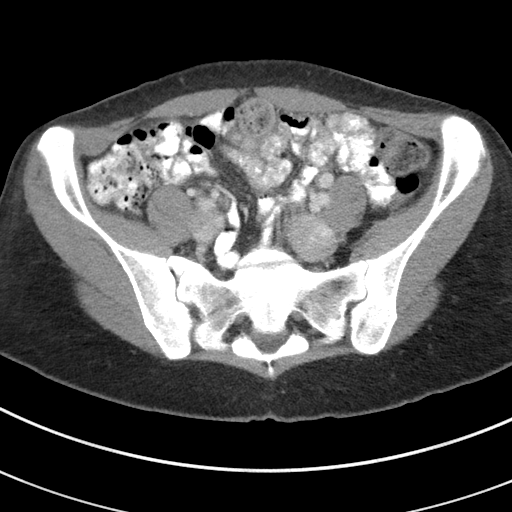
[im 45/85  soft-tissue]
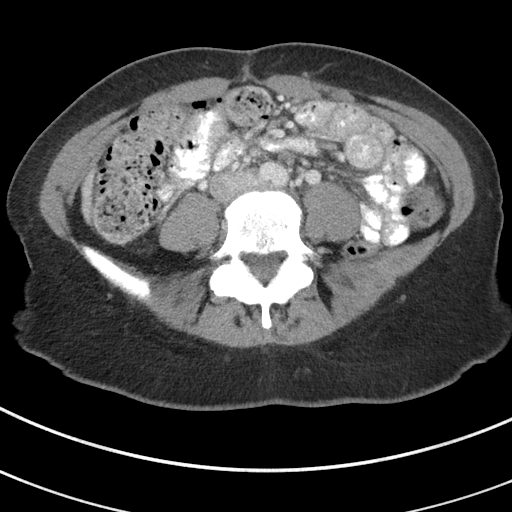
[im 50/85  soft-tissue]
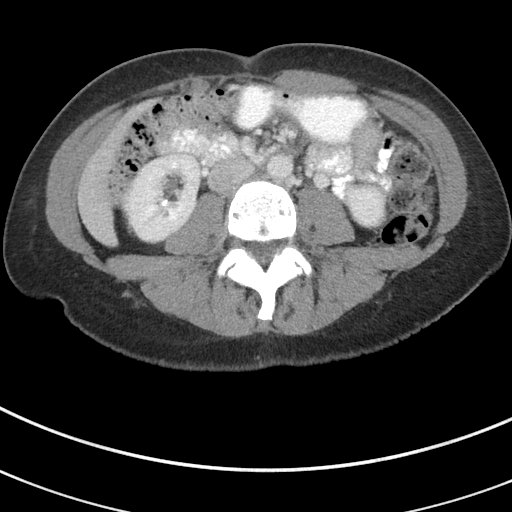
[im 55/85  soft-tissue]
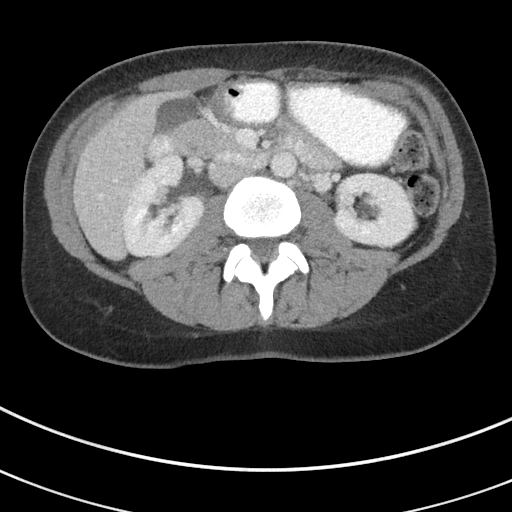
[im 65/85  soft-tissue]
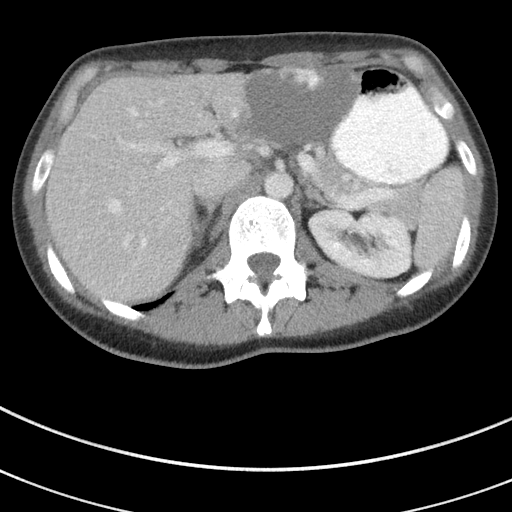
[im 65/85  bone]
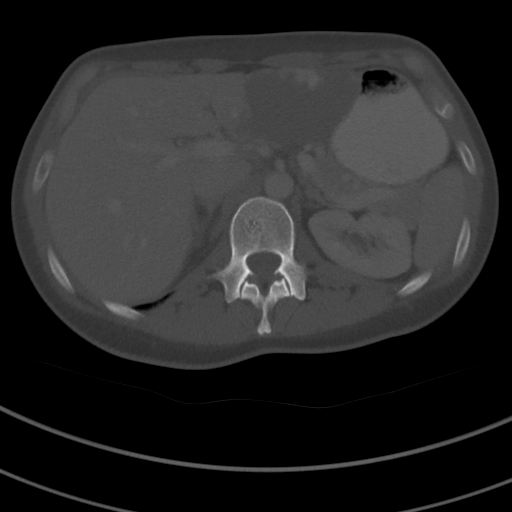
[im 70/85  soft-tissue]
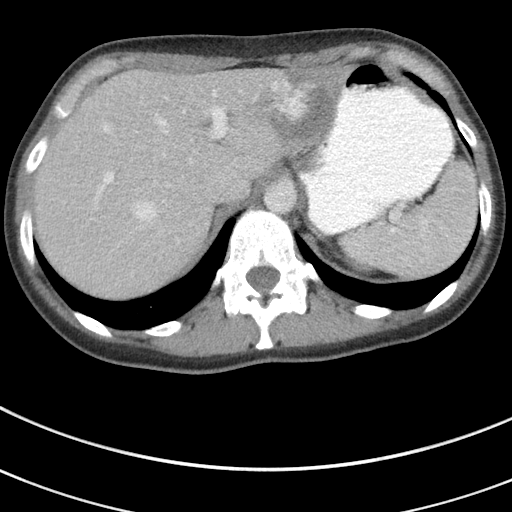
[im 80/85  soft-tissue]
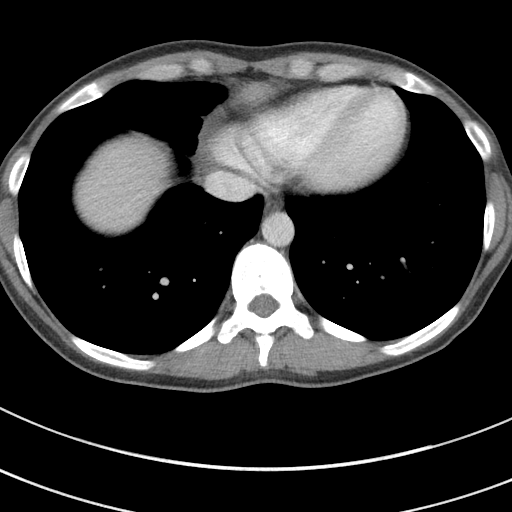

[Series 5: coronal st · coronal · 0.64mm/px · 3 of 81 slices shown]
[im 27/81  soft-tissue]
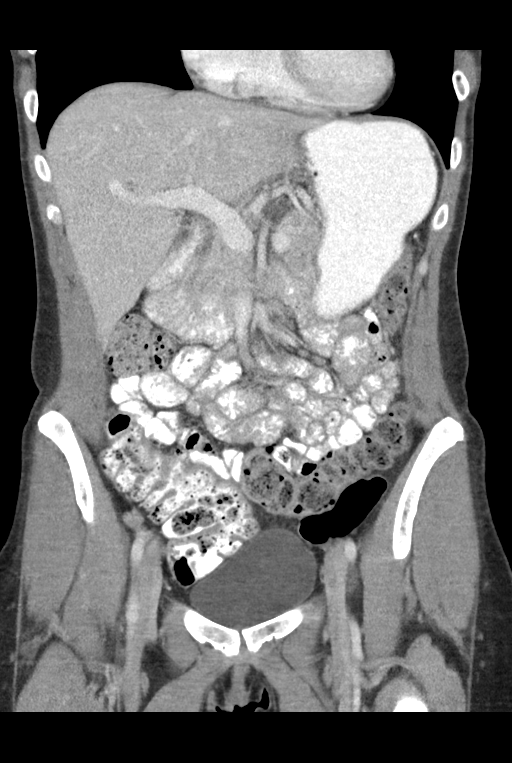
[im 36/81  soft-tissue]
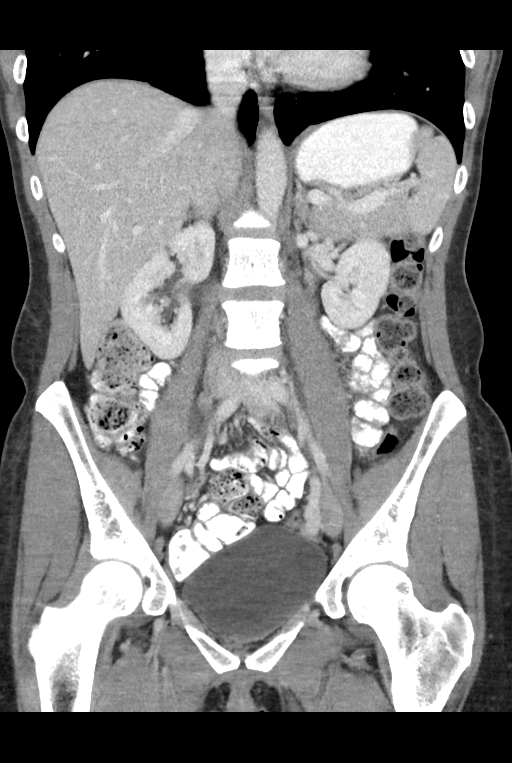
[im 45/81  soft-tissue]
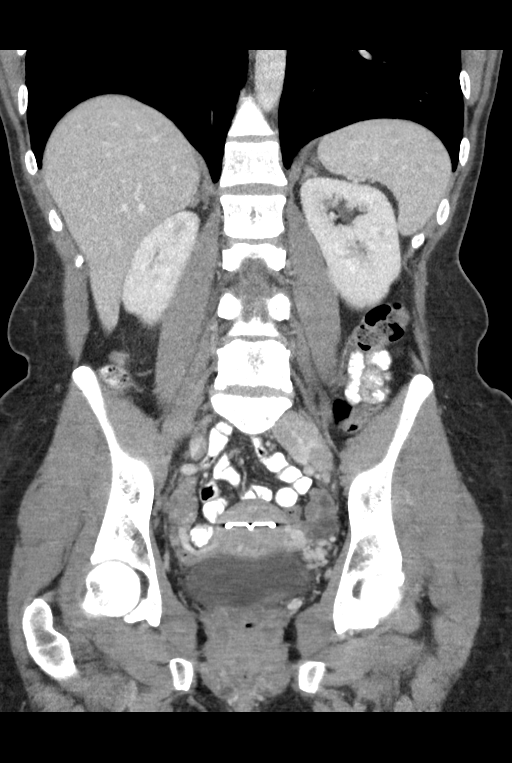

[14 of 46 positions shown; findings below may reference images not displayed]

FINDINGS: Lower chest:  Images of the lung bases are unremarkable.

Hepatobiliary: Enhanced liver shows no biliary ductal dilatation.
There is a low-density lesion in lateral aspect of the left hepatic
lobe measures at least 7.8 x 5.2 cm. There is patchy peripheral
nodular enhancement. The patient was brought back for additional
delayed images of the liver. The lesion fills in with only small
area of decreased central attenuation on delayed images. The lesion
is consistent with typical hemangioma. This has increased in size
from 5994 when measures 3.5 by 3.1 cm. Follow-up examination should
be based on clinical grounds.

Pancreas: Enhanced pancreas is unremarkable.

Spleen: Enhanced spleen is unremarkable. Small vascular
calcifications are noted in splenic hilum.

Adrenals/Urinary Tract: No adrenal gland mass. Kidneys are
symmetrical in size and enhancement. No hydronephrosis or
hydroureter. Delayed renal images shows bilateral renal symmetrical
excretion. Bilateral visualized proximal ureter is unremarkable.

Stomach/Bowel: No small bowel obstruction. No gastric outlet
obstruction. Moderate stool noted throughout the colon. There is a
low lying cecum. The cecum is in right anterior pelvis just above
the urinary bladder. Normal appendix is noted in coronal image 42.
Mild mass effect from the cecum on upper anterior aspect of the
bladder. No distal colonic obstruction. No evidence of colitis or
diverticulitis.

Vascular/Lymphatic: No aortic aneurysm. No retroperitoneal or
mesenteric adenopathy.

Reproductive: The uterus is anteflexed. IUD noted within uterus. No
adnexal mass. A dominant follicle is noted within left ovary
measures 1.1 cm. There are moderate congestion left adnexal vessels.
Mild congested right adnexal vessels. Clinical correlation is
necessary to exclude pelvic congestion syndrome

Other: No ascites or free abdominal air. There is a left inguinal
lymph node measures 1 by 1.7 cm. Right inguinal lymph node measures
1.7 by 1 cm. These are borderline probable reactive.

Musculoskeletal: No destructive bony lesions are noted.
IMPRESSION: 1. Low density lesion with peripheral nodular enhancement in left
hepatic lobe laterally consistent with hemangioma. Measures 7.8 x
5.2 cm. Increased in size from prior exam when measured 3.5 x
cm. Follow-up examination should be based on clinical grounds.
2. No hydronephrosis or hydroureter.
3. No small bowel obstruction.
4. Moderate stool noted throughout the colon. Abundant stool noted
within cecum. There is a low lying cecum with mild mass effect on
upper anterior aspect of the urinary bladder.
5. Normal appendix.
6. No colitis or diverticulitis.
7. Congested adnexal vessels left greater than right. Clinical
correlation is necessary to exclude pelvic congestion syndrome. IUD
in place.
8. Borderline enlarged bilateral inguinal lymph nodes probable
reactive. Clinical correlation is necessary.
These results were called by telephone at the time of interpretation
on 10/01/2015 at [DATE] to Dr. Ganey Irbad, who verbally acknowledged
these results.

## 2017-03-08 DIAGNOSIS — F988 Other specified behavioral and emotional disorders with onset usually occurring in childhood and adolescence: Secondary | ICD-10-CM | POA: Diagnosis not present

## 2017-03-08 DIAGNOSIS — Z6823 Body mass index (BMI) 23.0-23.9, adult: Secondary | ICD-10-CM | POA: Diagnosis not present

## 2017-03-08 DIAGNOSIS — F411 Generalized anxiety disorder: Secondary | ICD-10-CM | POA: Diagnosis not present

## 2017-04-01 DIAGNOSIS — Z6824 Body mass index (BMI) 24.0-24.9, adult: Secondary | ICD-10-CM | POA: Diagnosis not present

## 2017-04-01 DIAGNOSIS — F988 Other specified behavioral and emotional disorders with onset usually occurring in childhood and adolescence: Secondary | ICD-10-CM | POA: Diagnosis not present

## 2017-04-21 ENCOUNTER — Other Ambulatory Visit: Payer: Self-pay | Admitting: Obstetrics and Gynecology

## 2017-04-21 ENCOUNTER — Ambulatory Visit (INDEPENDENT_AMBULATORY_CARE_PROVIDER_SITE_OTHER): Payer: 59 | Admitting: Obstetrics and Gynecology

## 2017-04-21 ENCOUNTER — Encounter: Payer: Self-pay | Admitting: Obstetrics and Gynecology

## 2017-04-21 VITALS — BP 113/65 | HR 83 | Ht 69.0 in | Wt 168.0 lb

## 2017-04-21 DIAGNOSIS — E559 Vitamin D deficiency, unspecified: Secondary | ICD-10-CM | POA: Diagnosis not present

## 2017-04-21 DIAGNOSIS — Z01419 Encounter for gynecological examination (general) (routine) without abnormal findings: Secondary | ICD-10-CM | POA: Diagnosis not present

## 2017-04-21 DIAGNOSIS — I7300 Raynaud's syndrome without gangrene: Secondary | ICD-10-CM

## 2017-04-21 NOTE — Progress Notes (Signed)
Subjective:   Haley Wilkins is a 37 y.o. G71P0 Caucasian female here for a routine well-woman exam.  No LMP recorded. (Menstrual status: IUD).    Current complaints: a lot of stress, increased depression due to death of mother in law,  PCP: Junius Creamer       does desire labs  Social History: Sexual: heterosexual Marital Status: married Living situation: with family Occupation: homemaker Tobacco/alcohol: no tobacco use Illicit drugs: no history of illicit drug use  The following portions of the patient's history were reviewed and updated as appropriate: allergies, current medications, past family history, past medical history, past social history, past surgical history and problem list.  Past Medical History Past Medical History:  Diagnosis Date  . Pelvic pain in female   . Raynaud's disease   . Rheumatoid arthritis ([place])   . Vaginal Pap smear, abnormal   . Vitamin B 12 deficiency     Past Surgical History Past Surgical History:  Procedure Laterality Date  . CESAREAN SECTION    . LEEP  2006  . LITHOTRIPSY  2003    Gynecologic History G1P0  No LMP recorded. (Menstrual status: IUD). Contraception: IUD Last Pap: 2018. Results were: ASCUS   Obstetric History OB History  Gravida Para Term Preterm AB Living  1            SAB TAB Ectopic Multiple Live Births               # Outcome Date GA Lbr Len/2nd Weight Sex Delivery Anes PTL Lv  1 Gravida 2005     CS-Unspec       Current Medications Current Outpatient Medications on File Prior to Visit  Medication Sig Dispense Refill  . ALPRAZolam (XANAX) 0.25 MG tablet Take 1 tablet (0.25 mg total) by mouth daily as needed for anxiety. 30 tablet 2  . levonorgestrel (MIRENA) 20 MCG/24HR IUD 1 each by Intrauterine route once.    . linaclotide (LINZESS) 145 MCG CAPS capsule Take 1 capsule (145 mcg total) by mouth daily before breakfast. 30 capsule 2  . lisdexamfetamine (VYVANSE) 30 MG capsule Take 30 mg by mouth daily.    Marland Kitchen omeprazole  (PRILOSEC) 40 MG capsule Take 40 mg by mouth 2 (two) times daily.    . ondansetron (ZOFRAN) 8 MG tablet Take by mouth every 8 (eight) hours as needed for nausea or vomiting.    . Vitamin D, Ergocalciferol, (DRISDOL) 50000 units CAPS capsule Take 1 capsule (50,000 Units total) by mouth every 7 (seven) days. 30 capsule 2  . clindamycin (CLEOCIN) 2 % vaginal cream Place 1 Applicatorful vaginally at bedtime. (Patient not taking: Reported on 04/21/2017) 40 g 0  . FLUoxetine (PROZAC) 10 MG tablet Take 1 tablet (10 mg total) by mouth daily. (Patient not taking: Reported on 04/21/2017) 30 tablet 6  . fluticasone (FLONASE) 50 MCG/ACT nasal spray Place 2 sprays into both nostrils daily for 10 days. 16 g 0  . ibuprofen (ADVIL,MOTRIN) 800 MG tablet TAKE 1 TABLET BY MOUTH EVERY 8 HOURS AS NEEDED. 60 tablet 1  . potassium chloride (K-DUR,KLOR-CON) 10 MEQ tablet Take 10 mEq by mouth 2 (two) times daily.     No current facility-administered medications on file prior to visit.     Review of Systems Patient denies any headaches, blurred vision, shortness of breath, chest pain, abdominal pain, problems with bowel movements, urination, or intercourse.  Objective:  BP 113/65   Pulse 83   Ht 5\' 9"  (1.753 m)   Wt  168 lb (76.2 kg)   BMI 24.81 kg/m  Physical Exam  General:  Well developed, well nourished, no acute distress. She is alert and oriented x3. Skin:  Warm and dry Neck:  Midline trachea, no thyromegaly or nodules Cardiovascular: Regular rate and rhythm, no murmur heard Lungs:  Effort normal, all lung fields clear to auscultation bilaterally Breasts:  No dominant palpable mass, retraction, or nipple discharge Abdomen:  Soft, non tender, no hepatosplenomegaly or masses Pelvic:  External genitalia is normal in appearance.  The vagina is normal in appearance. The cervix is bulbous, no CMT.  Thin prep pap is done with HR HPV cotesting. Uterus is felt to be normal size, shape, and contour. IUD string noted.   No adnexal masses or tenderness noted. Extremities:  No swelling or varicosities noted Psych:  She has a normal mood and affect  Assessment:   Healthy well-woman exam RA ADD   Plan:  Increased vyvance to 50mg  until sees PCP again. F/U 1 year for AE, or sooner if needed   Melody Rockney Ghee, CNM

## 2017-04-22 LAB — THYROID PANEL WITH TSH
Free Thyroxine Index: 1.6 (ref 1.2–4.9)
T3 Uptake Ratio: 26 % (ref 24–39)
T4, Total: 6 ug/dL (ref 4.5–12.0)
TSH: 2.38 u[IU]/mL (ref 0.450–4.500)

## 2017-04-22 LAB — COMPREHENSIVE METABOLIC PANEL
ALT: 12 IU/L (ref 0–32)
AST: 19 IU/L (ref 0–40)
Albumin/Globulin Ratio: 1.3 (ref 1.2–2.2)
Albumin: 4.3 g/dL (ref 3.5–5.5)
Alkaline Phosphatase: 60 IU/L (ref 39–117)
BUN/Creatinine Ratio: 11 (ref 9–23)
BUN: 9 mg/dL (ref 6–20)
Bilirubin Total: 0.5 mg/dL (ref 0.0–1.2)
CO2: 23 mmol/L (ref 20–29)
Calcium: 9.1 mg/dL (ref 8.7–10.2)
Chloride: 106 mmol/L (ref 96–106)
Creatinine, Ser: 0.84 mg/dL (ref 0.57–1.00)
GFR calc Af Amer: 102 mL/min/{1.73_m2} (ref >59)
GFR calc non Af Amer: 88 mL/min/{1.73_m2} (ref >59)
Globulin, Total: 3.3 g/dL (ref 1.5–4.5)
Glucose: 78 mg/dL (ref 65–99)
Potassium: 4.8 mmol/L (ref 3.5–5.2)
Sodium: 144 mmol/L (ref 134–144)
Total Protein: 7.6 g/dL (ref 6.0–8.5)

## 2017-04-22 LAB — LIPID PANEL
Chol/HDL Ratio: 2.2 ratio (ref 0.0–4.4)
Cholesterol, Total: 147 mg/dL (ref 100–199)
HDL: 68 mg/dL (ref >39)
LDL Calculated: 71 mg/dL (ref 0–99)
Triglycerides: 38 mg/dL (ref 0–149)
VLDL Cholesterol Cal: 8 mg/dL (ref 5–40)

## 2017-04-22 LAB — B12 AND FOLATE PANEL
Folate: >20 ng/mL (ref >3.0)
Vitamin B-12: 482 pg/mL (ref 232–1245)

## 2017-04-22 LAB — VITAMIN D 25 HYDROXY (VIT D DEFICIENCY, FRACTURES): Vit D, 25-Hydroxy: 25.1 ng/mL — ABNORMAL LOW (ref 30.0–100.0)

## 2017-04-27 ENCOUNTER — Other Ambulatory Visit: Payer: Self-pay | Admitting: Obstetrics and Gynecology

## 2017-04-27 LAB — CYTOLOGY - PAP

## 2017-04-28 ENCOUNTER — Encounter: Payer: Self-pay | Admitting: Obstetrics and Gynecology

## 2017-04-29 ENCOUNTER — Other Ambulatory Visit: Payer: Self-pay | Admitting: Obstetrics and Gynecology

## 2017-05-09 ENCOUNTER — Ambulatory Visit: Payer: 59 | Admitting: Nurse Practitioner

## 2017-05-09 ENCOUNTER — Encounter: Payer: Self-pay | Admitting: Nurse Practitioner

## 2017-05-09 VITALS — BP 112/64 | HR 85 | Resp 16 | Ht 69.0 in | Wt 170.2 lb

## 2017-05-09 DIAGNOSIS — F320 Major depressive disorder, single episode, mild: Secondary | ICD-10-CM | POA: Diagnosis not present

## 2017-05-09 DIAGNOSIS — R41840 Attention and concentration deficit: Secondary | ICD-10-CM

## 2017-05-09 DIAGNOSIS — F32A Depression, unspecified: Secondary | ICD-10-CM

## 2017-05-09 MED ORDER — FLUOXETINE HCL 10 MG PO CAPS: ORAL_CAPSULE | ORAL | 3 refills | Status: DC

## 2017-05-09 MED ORDER — LISDEXAMFETAMINE DIMESYLATE 70 MG PO CAPS: 70.0000 mg | ORAL_CAPSULE | Freq: Every day | ORAL | 0 refills | Status: DC

## 2017-05-09 NOTE — Progress Notes (Signed)
[facility] [place], [place] [address]  Internal MEDICINE  Office Visit Note  Patient Name: Haley Wilkins  [id number]  [id number]  Date of Service: 05/29/2017   Pt is here for routine follow up.   Chief Complaint  Patient presents with  . Fatigue  . Depression    The patient is here for routine follow up. She is feeling fatigued, joints are hurting.Forgetfulness are also reported symptoms. Having some marital issues. Started in December when her mother in law passed away. Husband is pulling away and using this as an excuse to withdraw from the marriage. The patient is feeling overwhelmed. No longer taking prozac. GYN provider tried her on low dose effexor, which was much too strong. Vyvanse was lowered to 104m daily. Labs were checked at her GYN visit. Vitamin d was a bit low, but other labs were normal. These results are on file in her chart . She does have issues with rheumatoid arthritis. joints have been hurting more and worse than they have in long time. Knees and ankle hurt especially bad.        Current Medication: Outpatient Encounter Medications as of 05/09/2017  Medication Sig  . ALPRAZolam (XANAX) 0.25 MG tablet Take 1 tablet (0.25 mg total) by mouth daily as needed for anxiety.  . Cyanocobalamin (B-12 COMPLIANCE INJECTION) 1000 MCG/ML KIT Inject as directed.  .Marland Kitchenibuprofen (ADVIL,MOTRIN) 800 MG tablet TAKE 1 TABLET BY MOUTH EVERY 8 HOURS AS NEEDED.  .Marland Kitchenlevonorgestrel (MIRENA) 20 MCG/24HR IUD 1 each by Intrauterine route once.  . linaclotide (LINZESS) 145 MCG CAPS capsule Take 1 capsule (145 mcg total) by mouth daily before breakfast.  . lisdexamfetamine (VYVANSE) 70 MG capsule Take 1 capsule (70 mg total) by mouth daily.  .Marland Kitchenomeprazole (PRILOSEC) 40 MG capsule Take 40 mg by mouth 2 (two) times daily.  . ondansetron (ZOFRAN) 8 MG tablet Take by mouth every 8 (eight) hours as needed for nausea or vomiting.  . Vitamin D, Ergocalciferol, (DRISDOL) 50000  units CAPS capsule TAKE 1 CAPSULE BY MOUTH EVERY 7 DAYS.  . [DISCONTINUED] lisdexamfetamine (VYVANSE) 30 MG capsule Take 30 mg by mouth daily.  . [DISCONTINUED] lisdexamfetamine (VYVANSE) 70 MG capsule Take 1 capsule (70 mg total) by mouth daily.  .Marland KitchenFLUoxetine (PROZAC) 10 MG capsule Take 1 to 2 capsules daily.  . fluticasone (FLONASE) 50 MCG/ACT nasal spray Place 2 sprays into both nostrils daily for 10 days.  . [DISCONTINUED] clindamycin (CLEOCIN) 2 % vaginal cream Place 1 Applicatorful vaginally at bedtime. (Patient not taking: Reported on 04/21/2017)  . [DISCONTINUED] FLUoxetine (PROZAC) 10 MG tablet Take 1 tablet (10 mg total) by mouth daily. (Patient not taking: Reported on 05/09/2017)  . [DISCONTINUED] potassium chloride (K-DUR,KLOR-CON) 10 MEQ tablet Take 10 mEq by mouth 2 (two) times daily.   No facility-administered encounter medications on file as of 05/09/2017.     Surgical History: Past Surgical History:  Procedure Laterality Date  . CESAREAN SECTION    . LEEP  2006  . LITHOTRIPSY  2003    Medical History: Past Medical History:  Diagnosis Date  . Pelvic pain in female   . Raynaud's disease   . Rheumatoid arthritis ([place]   . Vaginal Pap smear, abnormal   . Vitamin B 12 deficiency     Family History: Family History  Problem Relation Age of Onset  . Diabetes Mother   . Diabetes Father   . Diabetes Maternal Grandfather     Social History  Socioeconomic History  . Marital status: Married    Spouse name: Not on file  . Number of children: Not on file  . Years of education: Not on file  . Highest education level: Not on file  Occupational History  . Not on file  Social Needs  . Financial resource strain: Not on file  . Food insecurity:    Worry: Not on file    Inability: Not on file  . Transportation needs:    Medical: Not on file    Non-medical: Not on file  Tobacco Use  . Smoking status: Never Smoker  . Smokeless tobacco: Never Used  Substance and  Sexual Activity  . Alcohol use: Yes  . Drug use: No  . Sexual activity: Yes    Birth control/protection: IUD    Comment: mirena  Lifestyle  . Physical activity:    Days per week: Not on file    Minutes per session: Not on file  . Stress: Not on file  Relationships  . Social connections:    Talks on phone: Not on file    Gets together: Not on file    Attends religious service: Not on file    Active member of club or organization: Not on file    Attends meetings of clubs or organizations: Not on file    Relationship status: Not on file  . Intimate partner violence:    Fear of current or ex partner: Not on file    Emotionally abused: Not on file    Physically abused: Not on file    Forced sexual activity: Not on file  Other Topics Concern  . Not on file  Social History Narrative  . Not on file      Review of Systems  Constitutional: Negative for chills, fatigue and unexpected weight change.  HENT: Positive for postnasal drip. Negative for congestion, rhinorrhea, sneezing and sore throat.   Eyes: Negative for redness.  Respiratory: Negative for cough, chest tightness and shortness of breath.   Cardiovascular: Negative for chest pain and palpitations.  Gastrointestinal: Negative for abdominal pain, constipation, diarrhea, nausea and vomiting.  Genitourinary: Negative for dysuria and frequency.  Musculoskeletal: Negative for arthralgias, back pain, joint swelling and neck pain.  Skin: Negative for rash.  Neurological: Negative.  Negative for tremors and numbness.  Hematological: Negative for adenopathy. Does not bruise/bleed easily.  Psychiatric/Behavioral: Negative for behavioral problems (Depression), sleep disturbance and suicidal ideas. The patient is not nervous/anxious.     Today's Vitals   05/09/17 1501  BP: 112/64  Pulse: 85  Resp: 16  SpO2: 100%  Weight: 170 lb 3.2 oz (77.2 kg)  Height: 5' 9" (1.753 m)    Physical Exam  Constitutional: She is oriented to  person, place, and time. She appears well-developed and well-nourished. No distress.  HENT:  Head: Normocephalic and atraumatic.  Mouth/Throat: Oropharynx is clear and moist. No oropharyngeal exudate.  Eyes: Pupils are equal, round, and reactive to light. Conjunctivae and EOM are normal.  Neck: Normal range of motion. Neck supple. No JVD present. No tracheal deviation present. No thyromegaly present.  Cardiovascular: Normal rate, regular rhythm and normal heart sounds. Exam reveals no gallop and no friction rub.  No murmur heard. Pulmonary/Chest: Effort normal and breath sounds normal. No respiratory distress. She has no wheezes. She has no rales. She exhibits no tenderness.  Abdominal: Soft. Bowel sounds are normal. There is no tenderness.  Musculoskeletal: Normal range of motion.  Lymphadenopathy:    She has  no cervical adenopathy.  Neurological: She is alert and oriented to person, place, and time. No cranial nerve deficit.  Skin: Skin is warm and dry. She is not diaphoretic.  Psychiatric: She has a normal mood and affect. Her behavior is normal. Judgment and thought content normal.  Nursing note and vitals reviewed.  Assessment/Plan: 1. Mild depressive disorder (HCC) Restart fluoxetine 35m capsules. May increase to 2 capsules daily as needed and as tolerated.  - FLUoxetine (PROZAC) 10 MG capsule; Take 1 to 2 capsules daily.  Dispense: 45 capsule; Refill: 3  2. Attention and concentration deficit May continue vyvanse 761mdaily. Three 30 day prescriptions provided. Dates are 05/09/2017, 06/06/2017, and 07/05/2017 - lisdexamfetamine (VYVANSE) 70 MG capsule; Take 1 capsule (70 mg total) by mouth daily.  Dispense: 30 capsule; Refill: 0  General Counseling: Lalilyonna steidlenderstanding of the findings of todays visit and agrees with plan of treatment. I have discussed any further diagnostic evaluation that may be needed or ordered today. We also reviewed her medications today. she has been  encouraged to call the office with any questions or concerns that should arise related to todays visit.  This patient was seen by HeLeretha PolFNP- C in Collaboration with Dr FoLavera Guises a part of collaborative care agreement    Meds ordered this encounter  Medications  . DISCONTD: lisdexamfetamine (VYVANSE) 70 MG capsule    Sig: Take 1 capsule (70 mg total) by mouth daily.    Dispense:  30 capsule    Refill:  0    Order Specific Question:   Supervising Provider    Answer:   KHLavera Guise1[[id number]]. FLUoxetine (PROZAC) 10 MG capsule    Sig: Take 1 to 2 capsules daily.    Dispense:  45 capsule    Refill:  3    Order Specific Question:   Supervising Provider    Answer:   KHLavera Guise1[[id number]]. lisdexamfetamine (VYVANSE) 70 MG capsule    Sig: Take 1 capsule (70 mg total) by mouth daily.    Dispense:  30 capsule    Refill:  0    Fill after 06/06/2017    Order Specific Question:   Supervising Provider    Answer:   KHLavera Guise1[[id number]]  Time spent: 1567inutes    Dr FoLavera Guisenternal medicine

## 2017-05-29 DIAGNOSIS — F320 Major depressive disorder, single episode, mild: Secondary | ICD-10-CM | POA: Insufficient documentation

## 2017-05-29 DIAGNOSIS — R41840 Attention and concentration deficit: Secondary | ICD-10-CM | POA: Insufficient documentation

## 2017-05-29 DIAGNOSIS — F32A Depression, unspecified: Secondary | ICD-10-CM | POA: Insufficient documentation

## 2017-06-01 ENCOUNTER — Other Ambulatory Visit: Payer: Self-pay | Admitting: Obstetrics and Gynecology

## 2017-06-01 ENCOUNTER — Encounter: Payer: Self-pay | Admitting: Obstetrics and Gynecology

## 2017-06-01 ENCOUNTER — Ambulatory Visit (INDEPENDENT_AMBULATORY_CARE_PROVIDER_SITE_OTHER): Payer: 59 | Admitting: Obstetrics and Gynecology

## 2017-06-01 VITALS — BP 107/68 | HR 81 | Ht 69.0 in | Wt 166.2 lb

## 2017-06-01 DIAGNOSIS — R87610 Atypical squamous cells of undetermined significance on cytologic smear of cervix (ASC-US): Secondary | ICD-10-CM

## 2017-06-01 DIAGNOSIS — R87810 Cervical high risk human papillomavirus (HPV) DNA test positive: Secondary | ICD-10-CM | POA: Diagnosis not present

## 2017-06-01 DIAGNOSIS — N72 Inflammatory disease of cervix uteri: Secondary | ICD-10-CM | POA: Diagnosis not present

## 2017-06-01 NOTE — Progress Notes (Signed)
ENCOMPASS COLPOSCOPY PROCEDURE NOTE  37 y.o. G1P0 here for colposcopy for ASCUS with POSITIVE high risk HPV pap smear on 04/21/17. Discussed role for HPV in cervical dysplasia, need for //surveillance.  Patient given informed cons//ent, signed copy in the chart, time out was performed.  Placed in lithotomy position. Cervix viewed with speculum and colposcope after application of acetic acid.   Colposcopy adequate? Yes  no visible lesions; corresponding biopsies obtained.  ECC specimen obtained. IUD string not disturbed during ECC All specimens were labelled and sent to pathology. See scanned colpo sheet with detailed drawing.   Patient was given post procedure instructions.  Will follow up pathology and manage accordingly.  Routine preventative health maintenance measures emphasized.     Angelis Gates Rockney Ghee, CNM

## 2017-06-21 ENCOUNTER — Ambulatory Visit: Payer: Self-pay | Admitting: Nurse Practitioner

## 2017-06-27 ENCOUNTER — Encounter: Payer: Self-pay | Admitting: Nurse Practitioner

## 2017-06-27 ENCOUNTER — Ambulatory Visit: Payer: 59 | Admitting: Nurse Practitioner

## 2017-06-27 VITALS — BP 120/70 | HR 94 | Resp 16 | Ht 69.0 in | Wt 167.0 lb

## 2017-06-27 DIAGNOSIS — R41840 Attention and concentration deficit: Secondary | ICD-10-CM

## 2017-06-27 DIAGNOSIS — N390 Urinary tract infection, site not specified: Secondary | ICD-10-CM

## 2017-06-27 DIAGNOSIS — M059 Rheumatoid arthritis with rheumatoid factor, unspecified: Secondary | ICD-10-CM | POA: Diagnosis not present

## 2017-06-27 DIAGNOSIS — R300 Dysuria: Secondary | ICD-10-CM | POA: Diagnosis not present

## 2017-06-27 DIAGNOSIS — M0579 Rheumatoid arthritis with rheumatoid factor of multiple sites without organ or systems involvement: Secondary | ICD-10-CM | POA: Insufficient documentation

## 2017-06-27 LAB — POCT URINALYSIS DIPSTICK
Bilirubin, UA: NEGATIVE
Blood, UA: NEGATIVE
Glucose, UA: NEGATIVE
Ketones, UA: NEGATIVE
Nitrite, UA: NEGATIVE
Protein, UA: NEGATIVE
Spec Grav, UA: 1.01 (ref 1.010–1.025)
Urobilinogen, UA: 0.2 E.U./dL
pH, UA: 7 (ref 5.0–8.0)

## 2017-06-27 MED ORDER — CIPROFLOXACIN HCL 500 MG PO TABS: 500.0000 mg | ORAL_TABLET | Freq: Two times a day (BID) | ORAL | 0 refills | Status: DC

## 2017-06-27 MED ORDER — LISDEXAMFETAMINE DIMESYLATE 70 MG PO CAPS: 70.0000 mg | ORAL_CAPSULE | Freq: Every day | ORAL | 0 refills | Status: DC

## 2017-06-27 MED ORDER — PHENAZOPYRIDINE HCL 200 MG PO TABS: 200.0000 mg | ORAL_TABLET | Freq: Three times a day (TID) | ORAL | 0 refills | Status: DC | PRN

## 2017-06-27 MED ORDER — IBUPROFEN 800 MG PO TABS: 800.0000 mg | ORAL_TABLET | Freq: Three times a day (TID) | ORAL | 1 refills | Status: DC | PRN

## 2017-06-27 NOTE — Progress Notes (Signed)
[facility] [place], [place] [address]  Internal MEDICINE  Office Visit Note  Patient Name: Haley Wilkins  [id number]  [id number]  Date of Service: 06/27/2017  Chief Complaint  Patient presents with  . Urinary Tract Infection  . Fatigue    The patient is here for routine follow up. She is feeling fatigued, joints are hurting.Forgetfulness are also reported symptoms. Having some marital issues. Started in December when her mother in law passed away. Husband is pulling away and using this as an excuse to withdraw from the marriage. The patient is feeling overwhelmed. No longer taking prozac. GYN provider tried her on low dose effexor, which was much too strong. Vyvanse was lowered to 73m daily. Labs were checked at her GYN visit. Vitamin d was a bit low, but other labs were normal. These results are on file in her chart . She does have issues with rheumatoid arthritis. joints have been hurting more and worse than they have in long time. Knees and ankle hurt especially bad.     Pt is here for routine follow up.    Current Medication: Outpatient Encounter Medications as of 06/27/2017  Medication Sig  . ALPRAZolam (XANAX) 0.25 MG tablet Take 1 tablet (0.25 mg total) by mouth daily as needed for anxiety.  . ciprofloxacin (CIPRO) 500 MG tablet Take 1 tablet (500 mg total) by mouth 2 (two) times daily.  . Cyanocobalamin (B-12 COMPLIANCE INJECTION) 1000 MCG/ML KIT Inject as directed.  .Marland KitchenFLUoxetine (PROZAC) 10 MG capsule Take 1 to 2 capsules daily. (Patient not taking: Reported on 06/01/2017)  . fluticasone (FLONASE) 50 MCG/ACT nasal spray Place 2 sprays into both nostrils daily for 10 days.  .Marland Kitchenibuprofen (ADVIL,MOTRIN) 800 MG tablet Take 1 tablet (800 mg total) by mouth every 8 (eight) hours as needed.  .Marland Kitchenlevonorgestrel (MIRENA) 20 MCG/24HR IUD 1 each by Intrauterine route once.  . linaclotide (LINZESS) 145 MCG CAPS capsule Take 1 capsule (145 mcg total) by mouth daily  before breakfast.  . lisdexamfetamine (VYVANSE) 70 MG capsule Take 1 capsule (70 mg total) by mouth daily.  .Marland Kitchenomeprazole (PRILOSEC) 40 MG capsule Take 40 mg by mouth 2 (two) times daily.  . ondansetron (ZOFRAN) 8 MG tablet Take by mouth every 8 (eight) hours as needed for nausea or vomiting.  . phenazopyridine (PYRIDIUM) 200 MG tablet Take 1 tablet (200 mg total) by mouth 3 (three) times daily as needed for pain.  . Vitamin D, Ergocalciferol, (DRISDOL) 50000 units CAPS capsule TAKE 1 CAPSULE BY MOUTH EVERY 7 DAYS.  . [DISCONTINUED] ibuprofen (ADVIL,MOTRIN) 800 MG tablet TAKE 1 TABLET BY MOUTH EVERY 8 HOURS AS NEEDED.  . [DISCONTINUED] lisdexamfetamine (VYVANSE) 70 MG capsule Take 1 capsule (70 mg total) by mouth daily.  . [DISCONTINUED] lisdexamfetamine (VYVANSE) 70 MG capsule Take 1 capsule (70 mg total) by mouth daily.  . [DISCONTINUED] lisdexamfetamine (VYVANSE) 70 MG capsule Take 1 capsule (70 mg total) by mouth daily.   No facility-administered encounter medications on file as of 06/27/2017.     Surgical History: Past Surgical History:  Procedure Laterality Date  . CESAREAN SECTION    . LEEP  2006  . LITHOTRIPSY  2003    Medical History: Past Medical History:  Diagnosis Date  . Pelvic pain in female   . Raynaud's disease   . Rheumatoid arthritis ([place]   . Vaginal Pap smear, abnormal   . Vitamin B 12 deficiency     Family History: Family History  Problem  Relation Age of Onset  . Diabetes Mother   . Diabetes Father   . Diabetes Maternal Grandfather     Social History   Socioeconomic History  . Marital status: Married    Spouse name: Not on file  . Number of children: Not on file  . Years of education: Not on file  . Highest education level: Not on file  Occupational History  . Not on file  Social Needs  . Financial resource strain: Not on file  . Food insecurity:    Worry: Not on file    Inability: Not on file  . Transportation needs:    Medical: Not on file     Non-medical: Not on file  Tobacco Use  . Smoking status: Never Smoker  . Smokeless tobacco: Never Used  Substance and Sexual Activity  . Alcohol use: Yes  . Drug use: No  . Sexual activity: Yes    Birth control/protection: IUD    Comment: mirena  Lifestyle  . Physical activity:    Days per week: Not on file    Minutes per session: Not on file  . Stress: Not on file  Relationships  . Social connections:    Talks on phone: Not on file    Gets together: Not on file    Attends religious service: Not on file    Active member of club or organization: Not on file    Attends meetings of clubs or organizations: Not on file    Relationship status: Not on file  . Intimate partner violence:    Fear of current or ex partner: Not on file    Emotionally abused: Not on file    Physically abused: Not on file    Forced sexual activity: Not on file  Other Topics Concern  . Not on file  Social History Narrative  . Not on file      Review of Systems  Constitutional: Positive for fatigue. Negative for activity change, chills and unexpected weight change.       Improved fatigue   HENT: Negative for congestion, postnasal drip, rhinorrhea, sneezing and sore throat.   Eyes: Negative.  Negative for redness.  Respiratory: Negative for cough, chest tightness and shortness of breath.   Cardiovascular: Negative for chest pain and palpitations.  Gastrointestinal: Negative for abdominal pain, constipation, diarrhea, nausea and vomiting.  Endocrine: Negative for heat intolerance, polydipsia, polyphagia and polyuria.  Genitourinary: Positive for dysuria, frequency and urgency.  Musculoskeletal: Negative for arthralgias, back pain, joint swelling and neck pain.  Skin: Negative for rash.  Allergic/Immunologic: Negative for environmental allergies.  Neurological: Negative for tremors, numbness and headaches.  Hematological: Negative for adenopathy. Does not bruise/bleed easily.   Psychiatric/Behavioral: Positive for decreased concentration. Negative for behavioral problems (Depression), sleep disturbance and suicidal ideas. The patient is nervous/anxious.        Improved focus and anxiety on Vyvanse 33m daily.     Today's Vitals   06/27/17 1345  BP: 120/70  Pulse: 94  Resp: 16  SpO2: 100%  Weight: 167 lb (75.8 kg)  Height: 5' 9"  (1.753 m)    Physical Exam  Constitutional: She is oriented to person, place, and time. She appears well-developed and well-nourished. No distress.  HENT:  Head: Normocephalic and atraumatic.  Mouth/Throat: Oropharynx is clear and moist. No oropharyngeal exudate.  Eyes: Pupils are equal, round, and reactive to light. Conjunctivae and EOM are normal.  Neck: Normal range of motion. Neck supple. No JVD present. No tracheal deviation  present. No thyromegaly present.  Cardiovascular: Normal rate, regular rhythm and normal heart sounds. Exam reveals no gallop and no friction rub.  No murmur heard. Pulmonary/Chest: Effort normal and breath sounds normal. No respiratory distress. She has no wheezes. She has no rales. She exhibits no tenderness.  Abdominal: Soft. Bowel sounds are normal. There is no tenderness.  Genitourinary:  Genitourinary Comments: Urine sample positive for small WBC and trace protein.   Musculoskeletal: Normal range of motion.  Lymphadenopathy:    She has no cervical adenopathy.  Neurological: She is alert and oriented to person, place, and time. No cranial nerve deficit.  Skin: Skin is warm and dry. She is not diaphoretic.  Psychiatric: She has a normal mood and affect. Her behavior is normal. Judgment and thought content normal.  Nursing note and vitals reviewed.  Assessment/Plan: 1. Urinary tract infection without hematuria, site unspecified Start cipro 536m BID for 7 days. Send urine for culture and sensitivity and adjust abx as indicated  - CULTURE, URINE COMPREHENSIVE - ciprofloxacin (CIPRO) 500 MG tablet;  Take 1 tablet (500 mg total) by mouth 2 (two) times daily.  Dispense: 20 tablet; Refill: 0  2. Dysuria Pyridium 2012mmay be taken up to TID as needed for bladder pain/spasms.  - POCT Urinalysis Dipstick - phenazopyridine (PYRIDIUM) 200 MG tablet; Take 1 tablet (200 mg total) by mouth 3 (three) times daily as needed for pain.  Dispense: 10 tablet; Refill: 0  3. Attention and concentration deficit Doing well. Continue vyvanse 7012maily as needed. Three 30 day prescriptions sent to pharmacy. Dates are 06/27/2017, 07/25/2017, and 08/23/2017 - lisdexamfetamine (VYVANSE) 70 MG capsule; Take 1 capsule (70 mg total) by mouth daily.  Dispense: 30 capsule; Refill: 0  4. Rheumatoid arthritis with positive rheumatoid factor, involving unspecified site ([facility] ibuprofen 800m61md as needed for pain/inflammation.  - ibuprofen (ADVIL,MOTRIN) 800 MG tablet; Take 1 tablet (800 mg total) by mouth every 8 (eight) hours as needed.  Dispense: 90 tablet; Refill: 1  General Counseling: Laurnura cahoonerstanding of the findings of todays visit and agrees with plan of treatment. I have discussed any further diagnostic evaluation that may be needed or ordered today. We also reviewed her medications today. she has been encouraged to call the office with any questions or concerns that should arise related to todays visit.    Counseling:  This patient was seen by HeatLeretha PolP- C in Collaboration with Dr FoziLavera Guisea part of collaborative care agreement    Orders Placed This Encounter  Procedures  . CULTURE, URINE COMPREHENSIVE  . POCT Urinalysis Dipstick    Meds ordered this encounter  Medications  . ciprofloxacin (CIPRO) 500 MG tablet    Sig: Take 1 tablet (500 mg total) by mouth 2 (two) times daily.    Dispense:  20 tablet    Refill:  0    Order Specific Question:   Supervising Provider    Answer:   KHANLavera Guise0[[id number]]phenazopyridine (PYRIDIUM) 200 MG tablet    Sig: Take 1  tablet (200 mg total) by mouth 3 (three) times daily as needed for pain.    Dispense:  10 tablet    Refill:  0    Order Specific Question:   Supervising Provider    Answer:   KHANLavera Guise0[[id number]]ibuprofen (ADVIL,MOTRIN) 800 MG tablet    Sig: Take 1 tablet (800 mg total) by mouth every 8 (eight) hours as needed.  Dispense:  90 tablet    Refill:  1    Order Specific Question:   Supervising Provider    Answer:   Lavera Guise [[id number]]  . DISCONTD: lisdexamfetamine (VYVANSE) 70 MG capsule    Sig: Take 1 capsule (70 mg total) by mouth daily.    Dispense:  30 capsule    Refill:  0    Order Specific Question:   Supervising Provider    Answer:   Lavera Guise [[id number]]  . DISCONTD: lisdexamfetamine (VYVANSE) 70 MG capsule    Sig: Take 1 capsule (70 mg total) by mouth daily.    Dispense:  30 capsule    Refill:  0    Fill after 07/25/2017    Order Specific Question:   Supervising Provider    Answer:   Lavera Guise [[id number]]  . lisdexamfetamine (VYVANSE) 70 MG capsule    Sig: Take 1 capsule (70 mg total) by mouth daily.    Dispense:  30 capsule    Refill:  0    Fill after 08/23/2017    Order Specific Question:   Supervising Provider    Answer:   Lavera Guise [[id number]]    Time spent: 86 Minutes     Dr Lavera Guise Internal medicine

## 2017-06-30 LAB — CULTURE, URINE COMPREHENSIVE

## 2017-08-31 ENCOUNTER — Other Ambulatory Visit: Payer: Self-pay

## 2017-09-01 ENCOUNTER — Other Ambulatory Visit: Payer: Self-pay

## 2017-09-01 MED ORDER — CYANOCOBALAMIN 1000 MCG/ML IJ KIT: PACK | INTRAMUSCULAR | 3 refills | Status: DC

## 2017-10-21 ENCOUNTER — Ambulatory Visit: Payer: 59 | Admitting: Nurse Practitioner

## 2017-10-21 ENCOUNTER — Encounter: Payer: Self-pay | Admitting: Nurse Practitioner

## 2017-10-21 VITALS — BP 120/70 | HR 68 | Resp 16 | Ht 69.0 in | Wt 167.0 lb

## 2017-10-21 DIAGNOSIS — R41840 Attention and concentration deficit: Secondary | ICD-10-CM

## 2017-10-21 DIAGNOSIS — M059 Rheumatoid arthritis with rheumatoid factor, unspecified: Secondary | ICD-10-CM | POA: Diagnosis not present

## 2017-10-21 DIAGNOSIS — E559 Vitamin D deficiency, unspecified: Secondary | ICD-10-CM

## 2017-10-21 DIAGNOSIS — E538 Deficiency of other specified B group vitamins: Secondary | ICD-10-CM | POA: Insufficient documentation

## 2017-10-21 MED ORDER — CYANOCOBALAMIN 1000 MCG/ML IJ SOLN: 1000.0000 ug | INTRAMUSCULAR | 5 refills | Status: DC

## 2017-10-21 MED ORDER — LISDEXAMFETAMINE DIMESYLATE 70 MG PO CAPS: 70.0000 mg | ORAL_CAPSULE | Freq: Every day | ORAL | 0 refills | Status: DC

## 2017-10-21 MED ORDER — VITAMIN D (ERGOCALCIFEROL) 1.25 MG (50000 UNIT) PO CAPS: 50000.0000 [IU] | ORAL_CAPSULE | ORAL | 5 refills | Status: DC

## 2017-10-21 NOTE — Progress Notes (Signed)
[facility] [place], [place] [address]  Internal MEDICINE  Office Visit Note  Patient Name: Haley Wilkins  [id number]  [id number]  Date of Service: 11/01/2017  Chief Complaint  Patient presents with  . Osteoarthritis    multiple flares of RA  . ADD    currently on vyvanse 22m daily    States that she has noted more frequent flare ups of RA. She has noted poor diet choices and using water flavoring, causing flares of RA. Has gone back on weight watchers. Started this week. Too early to notice if this is helping. When she has a flare, she has joint pain in multiple joints as well as severe fatigue. Fatigue is so bad, she can hardly keep her eyes open. She would like to see new rheumatologist, but would like to hold off on this for now.  She does take Vyvanse 717mdaily as needed. Helps to keep her focused ano on track and focused while at work. No negative side effects to report.       Current Medication: Outpatient Encounter Medications as of 10/21/2017  Medication Sig  . ALPRAZolam (XANAX) 0.25 MG tablet Take 1 tablet (0.25 mg total) by mouth daily as needed for anxiety.  . Marland Kitchenbuprofen (ADVIL,MOTRIN) 800 MG tablet Take 1 tablet (800 mg total) by mouth every 8 (eight) hours as needed.  . Marland Kitchenevonorgestrel (MIRENA) 20 MCG/24HR IUD 1 each by Intrauterine route once.  . linaclotide (LINZESS) 145 MCG CAPS capsule Take 1 capsule (145 mcg total) by mouth daily before breakfast.  . lisdexamfetamine (VYVANSE) 70 MG capsule Take 1 capsule (70 mg total) by mouth daily.  . Marland Kitchenmeprazole (PRILOSEC) 40 MG capsule Take 40 mg by mouth 2 (two) times daily.  . ondansetron (ZOFRAN) 8 MG tablet Take by mouth every 8 (eight) hours as needed for nausea or vomiting.  . Vitamin D, Ergocalciferol, (DRISDOL) 50000 units CAPS capsule Take 1 capsule (50,000 Units total) by mouth every 7 (seven) days.  . [DISCONTINUED] Cyanocobalamin (B-12 COMPLIANCE INJECTION) 1000 MCG/ML KIT Use as directed  once a month  . [DISCONTINUED] lisdexamfetamine (VYVANSE) 70 MG capsule Take 1 capsule (70 mg total) by mouth daily.  . [DISCONTINUED] lisdexamfetamine (VYVANSE) 70 MG capsule Take 1 capsule (70 mg total) by mouth daily.  . [DISCONTINUED] lisdexamfetamine (VYVANSE) 70 MG capsule Take 1 capsule (70 mg total) by mouth daily.  . [DISCONTINUED] Vitamin D, Ergocalciferol, (DRISDOL) 50000 units CAPS capsule TAKE 1 CAPSULE BY MOUTH EVERY 7 DAYS.  . Marland Kitchenyanocobalamin (,VITAMIN B-12,) 1000 MCG/ML injection Inject 1 mL (1,000 mcg total) into the muscle once a week.  . fluticasone (FLONASE) 50 MCG/ACT nasal spray Place 2 sprays into both nostrils daily for 10 days.  . [DISCONTINUED] ciprofloxacin (CIPRO) 500 MG tablet Take 1 tablet (500 mg total) by mouth 2 (two) times daily. (Patient not taking: Reported on 10/21/2017)  . [DISCONTINUED] FLUoxetine (PROZAC) 10 MG capsule Take 1 to 2 capsules daily. (Patient not taking: Reported on 06/01/2017)  . [DISCONTINUED] phenazopyridine (PYRIDIUM) 200 MG tablet Take 1 tablet (200 mg total) by mouth 3 (three) times daily as needed for pain. (Patient not taking: Reported on 10/21/2017)   No facility-administered encounter medications on file as of 10/21/2017.     Surgical History: Past Surgical History:  Procedure Laterality Date  . CESAREAN SECTION    . LEEP  2006  . LITHOTRIPSY  2003    Medical History: Past Medical History:  Diagnosis Date  . Pelvic pain in female   .  Raynaud's disease   . Rheumatoid arthritis ([place])   . Vaginal Pap smear, abnormal   . Vitamin B 12 deficiency     Family History: Family History  Problem Relation Age of Onset  . Diabetes Mother   . Diabetes Father   . Diabetes Maternal Grandfather     Social History   Socioeconomic History  . Marital status: Married    Spouse name: Not on file  . Number of children: Not on file  . Years of education: Not on file  . Highest education level: Not on file  Occupational History  . Not on  file  Social Needs  . Financial resource strain: Not on file  . Food insecurity:    Worry: Not on file    Inability: Not on file  . Transportation needs:    Medical: Not on file    Non-medical: Not on file  Tobacco Use  . Smoking status: Never Smoker  . Smokeless tobacco: Never Used  Substance and Sexual Activity  . Alcohol use: Yes  . Drug use: No  . Sexual activity: Yes    Birth control/protection: IUD    Comment: mirena  Lifestyle  . Physical activity:    Days per week: Not on file    Minutes per session: Not on file  . Stress: Not on file  Relationships  . Social connections:    Talks on phone: Not on file    Gets together: Not on file    Attends religious service: Not on file    Active member of club or organization: Not on file    Attends meetings of clubs or organizations: Not on file    Relationship status: Not on file  . Intimate partner violence:    Fear of current or ex partner: Not on file    Emotionally abused: Not on file    Physically abused: Not on file    Forced sexual activity: Not on file  Other Topics Concern  . Not on file  Social History Narrative  . Not on file      Review of Systems  Constitutional: Positive for fatigue. Negative for activity change, chills and unexpected weight change.       Improved fatigue   HENT: Negative for congestion, postnasal drip, rhinorrhea, sneezing, sore throat and voice change.   Eyes: Negative.  Negative for redness.  Respiratory: Negative for cough, chest tightness and shortness of breath.   Cardiovascular: Negative for chest pain and palpitations.  Gastrointestinal: Negative for abdominal pain, constipation, diarrhea, nausea and vomiting.  Endocrine: Negative for heat intolerance, polydipsia, polyphagia and polyuria.  Genitourinary: Negative for dysuria, frequency and urgency.  Musculoskeletal: Positive for arthralgias, joint swelling and myalgias. Negative for back pain and neck pain.       Joint  tenderness in many areas.   Skin: Positive for color change. Negative for rash.       Darkening/blueish tinge to the fingers and toes when cold. Has recently been getting more frequent and severe.  Allergic/Immunologic: Positive for environmental allergies.  Neurological: Negative for tremors, numbness and headaches.  Hematological: Negative for adenopathy. Does not bruise/bleed easily.  Psychiatric/Behavioral: Positive for decreased concentration. Negative for behavioral problems (Depression), sleep disturbance and suicidal ideas. The patient is nervous/anxious.        Improved focus and anxiety on Vyvanse 44m daily.     Today's Vitals   10/21/17 1354  BP: 120/70  Pulse: 68  Resp: 16  SpO2: 100%  Weight: 167  lb (75.8 kg)  Height: 5' 9" (1.753 m)    Physical Exam  Constitutional: She is oriented to person, place, and time. She appears well-developed and well-nourished. No distress.  HENT:  Head: Normocephalic and atraumatic.  Nose: Nose normal.  Mouth/Throat: Oropharynx is clear and moist. No oropharyngeal exudate.  Eyes: Pupils are equal, round, and reactive to light. Conjunctivae and EOM are normal.  Neck: Normal range of motion. Neck supple. No JVD present. No tracheal deviation present. No thyromegaly present.  Cardiovascular: Normal rate, regular rhythm and normal heart sounds. Exam reveals no gallop and no friction rub.  No murmur heard. Pulmonary/Chest: Effort normal and breath sounds normal. No respiratory distress. She has no wheezes. She has no rales. She exhibits no tenderness.  Abdominal: Soft. Bowel sounds are normal. There is no tenderness.  Musculoskeletal: Normal range of motion.  Lymphadenopathy:    She has no cervical adenopathy.  Neurological: She is alert and oriented to person, place, and time. No cranial nerve deficit.  Skin: Skin is warm and dry. She is not diaphoretic.  Psychiatric: She has a normal mood and affect. Her behavior is normal. Judgment and  thought content normal.  Nursing note and vitals reviewed.  Assessment/Plan: 1. Rheumatoid arthritis with positive rheumatoid factor, involving unspecified site [facility]) Likely causing increased severity and frequency of Raynaud's phenomenon. Will monitor. Consider new referral to rheumatology as indicated.    2. Vitamin B12 deficiency May continue monthly b12 injections at home. New prescription provided today.  - cyanocobalamin (,VITAMIN B-12,) 1000 MCG/ML injection; Inject 1 mL (1,000 mcg total) into the muscle once a week.  Dispense: 4 mL; Refill: 5  3. Vitamin D deficiency Drisdol 50000 iu weekly.  - Vitamin D, Ergocalciferol, (DRISDOL) 50000 units CAPS capsule; Take 1 capsule (50,000 Units total) by mouth every 7 (seven) days.  Dispense: 4 capsule; Refill: 5  4. Attention and concentration deficit May continue vyvanse 15m daily. Three 30 day prescriptions provided today. Dates are 10/21/2017, 11/18/2017, and 12/17/2017 - lisdexamfetamine (VYVANSE) 70 MG capsule; Take 1 capsule (70 mg total) by mouth daily.  Dispense: 30 capsule; Refill: 0  General Counseling: Lzemirah krasinskiunderstanding of the findings of todays visit and agrees with plan of treatment. I have discussed any further diagnostic evaluation that may be needed or ordered today. We also reviewed her medications today. she has been encouraged to call the office with any questions or concerns that should arise related to todays visit.   Refilled Controlled medications today. Reviewed risks and possible side effects associated with taking Stimulants. Combination of these drugs with other psychotropic medications could cause dizziness and drowsiness. Pt needs to Monitor symptoms and exercise caution in driving and operating heavy machinery to avoid damages to oneself, to others and to the surroundings. Patient verbalized understanding in this matter. Dependence and abuse for these drugs will be monitored closely. A Controlled  substance policy and procedure is on file which allows [facility] to order a urine drug screen test at any visit. Patient understands and agrees with the plan..  This patient was seen by HLeretha PolFNP Collaboration with Dr FLavera Guiseas a part of collaborative care agreement  Meds ordered this encounter  Medications  . cyanocobalamin (,VITAMIN B-12,) 1000 MCG/ML injection    Sig: Inject 1 mL (1,000 mcg total) into the muscle once a week.    Dispense:  4 mL    Refill:  5    Order Specific Question:   Supervising Provider  Answer:   Lavera Guise [place]  . Vitamin D, Ergocalciferol, (DRISDOL) 50000 units CAPS capsule    Sig: Take 1 capsule (50,000 Units total) by mouth every 7 (seven) days.    Dispense:  4 capsule    Refill:  5    Order Specific Question:   Supervising Provider    Answer:   Lavera Guise [[id number]]  . DISCONTD: lisdexamfetamine (VYVANSE) 70 MG capsule    Sig: Take 1 capsule (70 mg total) by mouth daily.    Dispense:  30 capsule    Refill:  0    Order Specific Question:   Supervising Provider    Answer:   Lavera Guise [[id number]]  . DISCONTD: lisdexamfetamine (VYVANSE) 70 MG capsule    Sig: Take 1 capsule (70 mg total) by mouth daily.    Dispense:  30 capsule    Refill:  0    Fill after 11/18/2017    Order Specific Question:   Supervising Provider    Answer:   Lavera Guise [[id number]]  . lisdexamfetamine (VYVANSE) 70 MG capsule    Sig: Take 1 capsule (70 mg total) by mouth daily.    Dispense:  30 capsule    Refill:  0    Fill after 12/17/2017    Order Specific Question:   Supervising Provider    Answer:   Lavera Guise [[id number]]    Time spent: 1 Minutes      Dr Lavera Guise Internal medicine

## 2018-01-20 ENCOUNTER — Encounter: Payer: Self-pay | Admitting: Nurse Practitioner

## 2018-01-20 ENCOUNTER — Ambulatory Visit: Payer: 59 | Admitting: Nurse Practitioner

## 2018-01-20 VITALS — BP 122/76 | HR 79 | Resp 16 | Ht 69.0 in | Wt 165.0 lb

## 2018-01-20 DIAGNOSIS — E538 Deficiency of other specified B group vitamins: Secondary | ICD-10-CM

## 2018-01-20 DIAGNOSIS — R41840 Attention and concentration deficit: Secondary | ICD-10-CM | POA: Diagnosis not present

## 2018-01-20 DIAGNOSIS — M059 Rheumatoid arthritis with rheumatoid factor, unspecified: Secondary | ICD-10-CM | POA: Diagnosis not present

## 2018-01-20 DIAGNOSIS — E559 Vitamin D deficiency, unspecified: Secondary | ICD-10-CM

## 2018-01-20 MED ORDER — LISDEXAMFETAMINE DIMESYLATE 70 MG PO CAPS: 70.0000 mg | ORAL_CAPSULE | Freq: Every day | ORAL | 0 refills | Status: DC

## 2018-01-20 NOTE — Progress Notes (Signed)
[facility] [place], [place] [address]  Internal MEDICINE  Office Visit Note  Patient Name: Haley Wilkins  [id number]  [id number]  Date of Service: 01/20/2018  Chief Complaint  Patient presents with  . Medical Management of Chronic Issues    3 month follow up medication refills    The patient is here for routine follow up. She is having increased frequency of RA. Increased joint pain and swelling. Has associated some of the flares to consumption of Red 40 dye. Causes severe pain in both arms from elbows to hands. Left ankle is causing her a lot of pain. At night, feels like she has a vice around the ankle. Makes it very hard to sleep well. Takes ibuprofen as needed for pain which helps the pain a little. Makes her feel very tired. Has not really found much else which helps the pain. Is much worse when it is cold outside. Raynaud's is much more frequent. Really doesn't go outside unless it's over 60 degrees. Has significant discoloration of the fingers and nose. But she also has generalized joint pain and pont tenderness.  She takes takes Vyvanse 70mg  daily as needed. Helps to keep her focused ano on track and focused while at work. No negative side effects to report.       Current Medication: Outpatient Encounter Medications as of 01/20/2018  Medication Sig  . ALPRAZolam (XANAX) 0.25 MG tablet Take 1 tablet (0.25 mg total) by mouth daily as needed for anxiety.  . cyanocobalamin (,VITAMIN B-12,) 1000 MCG/ML injection Inject 1 mL (1,000 mcg total) into the muscle once a week.  Marland Kitchen ibuprofen (ADVIL,MOTRIN) 800 MG tablet Take 1 tablet (800 mg total) by mouth every 8 (eight) hours as needed.  Marland Kitchen levonorgestrel (MIRENA) 20 MCG/24HR IUD 1 each by Intrauterine route once.  . linaclotide (LINZESS) 145 MCG CAPS capsule Take 1 capsule (145 mcg total) by mouth daily before breakfast.  . lisdexamfetamine (VYVANSE) 70 MG capsule Take 1 capsule (70 mg total) by mouth daily.  Marland Kitchen  omeprazole (PRILOSEC) 40 MG capsule Take 40 mg by mouth 2 (two) times daily.  . ondansetron (ZOFRAN) 8 MG tablet Take by mouth every 8 (eight) hours as needed for nausea or vomiting.  . Vitamin D, Ergocalciferol, (DRISDOL) 50000 units CAPS capsule Take 1 capsule (50,000 Units total) by mouth every 7 (seven) days.  . [DISCONTINUED] lisdexamfetamine (VYVANSE) 70 MG capsule Take 1 capsule (70 mg total) by mouth daily.  . [DISCONTINUED] lisdexamfetamine (VYVANSE) 70 MG capsule Take 1 capsule (70 mg total) by mouth daily.  . [DISCONTINUED] lisdexamfetamine (VYVANSE) 70 MG capsule Take 1 capsule (70 mg total) by mouth daily.  . fluticasone (FLONASE) 50 MCG/ACT nasal spray Place 2 sprays into both nostrils daily for 10 days.   No facility-administered encounter medications on file as of 01/20/2018.     Surgical History: Past Surgical History:  Procedure Laterality Date  . CESAREAN SECTION    . LEEP  2006  . LITHOTRIPSY  2003    Medical History: Past Medical History:  Diagnosis Date  . Pelvic pain in female   . Raynaud's disease   . Rheumatoid arthritis ([place])   . Vaginal Pap smear, abnormal   . Vitamin B 12 deficiency     Family History: Family History  Problem Relation Age of Onset  . Diabetes Mother   . Diabetes Father   . Diabetes Maternal Grandfather     Social History   Socioeconomic History  . Marital  status: Married    Spouse name: Not on file  . Number of children: Not on file  . Years of education: Not on file  . Highest education level: Not on file  Occupational History  . Not on file  Social Needs  . Financial resource strain: Not on file  . Food insecurity:    Worry: Not on file    Inability: Not on file  . Transportation needs:    Medical: Not on file    Non-medical: Not on file  Tobacco Use  . Smoking status: Never Smoker  . Smokeless tobacco: Never Used  Substance and Sexual Activity  . Alcohol use: Not Currently  . Drug use: No  . Sexual activity:  Yes    Birth control/protection: I.U.D.    Comment: mirena  Lifestyle  . Physical activity:    Days per week: Not on file    Minutes per session: Not on file  . Stress: Not on file  Relationships  . Social connections:    Talks on phone: Not on file    Gets together: Not on file    Attends religious service: Not on file    Active member of club or organization: Not on file    Attends meetings of clubs or organizations: Not on file    Relationship status: Not on file  . Intimate partner violence:    Fear of current or ex partner: Not on file    Emotionally abused: Not on file    Physically abused: Not on file    Forced sexual activity: Not on file  Other Topics Concern  . Not on file  Social History Narrative  . Not on file      Review of Systems  Constitutional: Positive for fatigue. Negative for activity change, chills and unexpected weight change.  HENT: Negative for congestion, postnasal drip, rhinorrhea, sneezing, sore throat and voice change.   Respiratory: Negative for cough, chest tightness and shortness of breath.   Cardiovascular: Negative for chest pain and palpitations.  Gastrointestinal: Negative for abdominal pain, constipation, diarrhea, nausea and vomiting.  Endocrine: Negative for heat intolerance, polydipsia, polyphagia and polyuria.  Musculoskeletal: Positive for arthralgias, joint swelling and myalgias. Negative for back pain and neck pain.       Joint tenderness in many areas. Particularly effecting hips, knees, left ankle, elbows, wrists, and hands.   Skin: Positive for color change. Negative for rash.       Darkening/blueish tinge to the fingers and toes when cold. Has recently been getting more frequent and severe.  Allergic/Immunologic: Positive for environmental allergies.  Neurological: Negative for tremors, numbness and headaches.  Hematological: Negative for adenopathy. Does not bruise/bleed easily.  Psychiatric/Behavioral: Positive for decreased  concentration. Negative for behavioral problems (Depression), sleep disturbance and suicidal ideas. The patient is nervous/anxious.        Improved focus and anxiety on Vyvanse 70mg  daily.    Today's Vitals   01/20/18 1438  BP: 122/76  Pulse: 79  Resp: 16  SpO2: 98%  Weight: 165 lb (74.8 kg)  Height: 5\' 9"  (1.753 m)    Physical Exam Vitals signs and nursing note reviewed.  Constitutional:      General: She is not in acute distress.    Appearance: Normal appearance. She is well-developed. She is not diaphoretic.  HENT:     Head: Normocephalic and atraumatic.     Nose: Nose normal.     Mouth/Throat:     Pharynx: No oropharyngeal exudate.  Eyes:     Conjunctiva/sclera: Conjunctivae normal.     Pupils: Pupils are equal, round, and reactive to light.  Neck:     Musculoskeletal: Normal range of motion and neck supple.     Thyroid: No thyromegaly.     Vascular: No JVD.     Trachea: No tracheal deviation.  Cardiovascular:     Rate and Rhythm: Normal rate and regular rhythm.     Heart sounds: Normal heart sounds. No murmur. No friction rub. No gallop.   Pulmonary:     Effort: Pulmonary effort is normal. No respiratory distress.     Breath sounds: Normal breath sounds. No wheezing or rales.  Chest:     Chest wall: No tenderness.  Abdominal:     General: Bowel sounds are normal.     Palpations: Abdomen is soft.     Tenderness: There is no abdominal tenderness.  Musculoskeletal: Normal range of motion.     Comments: Generalized joint pain with point tenderness present.   Lymphadenopathy:     Cervical: No cervical adenopathy.  Skin:    General: Skin is warm and dry.  Neurological:     General: No focal deficit present.     Mental Status: She is alert and oriented to person, place, and time.     Cranial Nerves: No cranial nerve deficit.  Psychiatric:        Behavior: Behavior normal.        Thought Content: Thought content normal.        Judgment: Judgment normal.    Assessment/Plan: 1. Rheumatoid arthritis with positive rheumatoid factor, involving unspecified site ([place]) Worsening symptoms and more frequent flares. Refer to rheumatology for further evaluation and treatment.  - Ambulatory referral to Rheumatology  2. Attention and concentration deficit May continue vyvanse 70mg  daily as needed. Three 30 day prescriptions provided today. Dates are 01/20/2018, 02/18/2018, and 03/19/2018 - lisdexamfetamine (VYVANSE) 70 MG capsule; Take 1 capsule (70 mg total) by mouth daily.  Dispense: 30 capsule; Refill: 0  3. Vitamin D deficiency Continue drisdol 50000iu weekly.  4. Vitamin B12 deficiency Continue b12 injections once monthly.   General Counseling: jacquelinne speak understanding of the findings of todays visit and agrees with plan of treatment. I have discussed any further diagnostic evaluation that may be needed or ordered today. We also reviewed her medications today. she has been encouraged to call the office with any questions or concerns that should arise related to todays visit.  Refilled Controlled medications today. Reviewed risks and possible side effects associated with taking Stimulants. Combination of these drugs with other psychotropic medications could cause dizziness and drowsiness. Pt needs to Monitor symptoms and exercise caution in driving and operating heavy machinery to avoid damages to oneself, to others and to the surroundings. Patient verbalized understanding in this matter. Dependence and abuse for these drugs will be monitored closely. A Controlled substance policy and procedure is on file which allows [facility] to order a urine drug screen test at any visit. Patient understands and agrees with the plan..  This patient was seen by Leretha Pol FNP Collaboration with Dr Lavera Guise as a part of collaborative care agreement  Orders Placed This Encounter  Procedures  . Ambulatory referral to Rheumatology    Meds  ordered this encounter  Medications  . DISCONTD: lisdexamfetamine (VYVANSE) 70 MG capsule    Sig: Take 1 capsule (70 mg total) by mouth daily.    Dispense:  30 capsule  Refill:  0    Order Specific Question:   Supervising Provider    Answer:   Lavera Guise [[id number]]  . DISCONTD: lisdexamfetamine (VYVANSE) 70 MG capsule    Sig: Take 1 capsule (70 mg total) by mouth daily.    Dispense:  30 capsule    Refill:  0    Fill after 02/18/2018    Order Specific Question:   Supervising Provider    Answer:   Lavera Guise [[id number]]  . lisdexamfetamine (VYVANSE) 70 MG capsule    Sig: Take 1 capsule (70 mg total) by mouth daily.    Dispense:  30 capsule    Refill:  0    Fill after 03/19/2018    Order Specific Question:   Supervising Provider    Answer:   Lavera Guise [[id number]]    Time spent: 25 Minutes      Dr Lavera Guise Internal medicine

## 2018-02-27 ENCOUNTER — Other Ambulatory Visit: Payer: Self-pay | Admitting: Nurse Practitioner

## 2018-02-27 ENCOUNTER — Encounter: Payer: Self-pay | Admitting: Nurse Practitioner

## 2018-02-27 DIAGNOSIS — N390 Urinary tract infection, site not specified: Secondary | ICD-10-CM

## 2018-02-27 MED ORDER — CIPROFLOXACIN HCL 500 MG PO TABS: 500.0000 mg | ORAL_TABLET | Freq: Two times a day (BID) | ORAL | 0 refills | Status: DC

## 2018-02-27 NOTE — Progress Notes (Signed)
Symptoms of UTI this morning. Sent prescription for cipro 500mg  bid for 7 days to [place].

## 2018-03-23 ENCOUNTER — Other Ambulatory Visit: Payer: Self-pay

## 2018-03-23 DIAGNOSIS — M059 Rheumatoid arthritis with rheumatoid factor, unspecified: Secondary | ICD-10-CM

## 2018-03-23 MED ORDER — IBUPROFEN 800 MG PO TABS: 800.0000 mg | ORAL_TABLET | Freq: Three times a day (TID) | ORAL | 1 refills | Status: DC | PRN

## 2018-03-24 ENCOUNTER — Other Ambulatory Visit: Payer: Self-pay | Admitting: Nurse Practitioner

## 2018-03-24 ENCOUNTER — Encounter: Payer: Self-pay | Admitting: Nurse Practitioner

## 2018-03-24 DIAGNOSIS — K219 Gastro-esophageal reflux disease without esophagitis: Secondary | ICD-10-CM

## 2018-03-24 MED ORDER — OMEPRAZOLE 40 MG PO CPDR: 40.0000 mg | DELAYED_RELEASE_CAPSULE | Freq: Two times a day (BID) | ORAL | 5 refills | Status: DC

## 2018-03-24 NOTE — Progress Notes (Signed)
Refilled prescription for omeprazole 40mg  daily prn per patient request

## 2018-03-29 DIAGNOSIS — D239 Other benign neoplasm of skin, unspecified: Secondary | ICD-10-CM

## 2018-03-29 DIAGNOSIS — D225 Melanocytic nevi of trunk: Secondary | ICD-10-CM | POA: Diagnosis not present

## 2018-03-29 DIAGNOSIS — D485 Neoplasm of uncertain behavior of skin: Secondary | ICD-10-CM | POA: Diagnosis not present

## 2018-03-29 DIAGNOSIS — I788 Other diseases of capillaries: Secondary | ICD-10-CM | POA: Diagnosis not present

## 2018-03-29 DIAGNOSIS — Z1283 Encounter for screening for malignant neoplasm of skin: Secondary | ICD-10-CM | POA: Diagnosis not present

## 2018-03-29 DIAGNOSIS — D1800 Hemangioma unspecified site: Secondary | ICD-10-CM | POA: Diagnosis not present

## 2018-03-29 DIAGNOSIS — Z808 Family history of malignant neoplasm of other organs or systems: Secondary | ICD-10-CM | POA: Diagnosis not present

## 2018-03-29 DIAGNOSIS — Z85828 Personal history of other malignant neoplasm of skin: Secondary | ICD-10-CM | POA: Diagnosis not present

## 2018-03-29 HISTORY — DX: Other benign neoplasm of skin, unspecified: D23.9

## 2018-04-24 ENCOUNTER — Ambulatory Visit (INDEPENDENT_AMBULATORY_CARE_PROVIDER_SITE_OTHER): Payer: 59 | Admitting: Nurse Practitioner

## 2018-04-24 ENCOUNTER — Other Ambulatory Visit: Payer: Self-pay

## 2018-04-24 ENCOUNTER — Encounter: Payer: Self-pay | Admitting: Nurse Practitioner

## 2018-04-24 VITALS — BP 106/72 | HR 82 | Resp 16 | Ht 69.0 in | Wt 164.0 lb

## 2018-04-24 DIAGNOSIS — B373 Candidiasis of vulva and vagina: Secondary | ICD-10-CM | POA: Diagnosis not present

## 2018-04-24 DIAGNOSIS — K047 Periapical abscess without sinus: Secondary | ICD-10-CM | POA: Diagnosis not present

## 2018-04-24 DIAGNOSIS — B3731 Acute candidiasis of vulva and vagina: Secondary | ICD-10-CM | POA: Insufficient documentation

## 2018-04-24 DIAGNOSIS — R41840 Attention and concentration deficit: Secondary | ICD-10-CM | POA: Diagnosis not present

## 2018-04-24 DIAGNOSIS — M059 Rheumatoid arthritis with rheumatoid factor, unspecified: Secondary | ICD-10-CM | POA: Diagnosis not present

## 2018-04-24 MED ORDER — SULFAMETHOXAZOLE-TRIMETHOPRIM 800-160 MG PO TABS: 1.0000 | ORAL_TABLET | Freq: Two times a day (BID) | ORAL | 0 refills | Status: DC

## 2018-04-24 MED ORDER — LISDEXAMFETAMINE DIMESYLATE 70 MG PO CAPS: 70.0000 mg | ORAL_CAPSULE | Freq: Every day | ORAL | 0 refills | Status: DC

## 2018-04-24 MED ORDER — FLUCONAZOLE 150 MG PO TABS: ORAL_TABLET | ORAL | 0 refills | Status: DC

## 2018-04-24 NOTE — Progress Notes (Addendum)
[facility] [place], [place] [address]  Internal MEDICINE  Office Visit Note  Patient Name: Haley Wilkins  [id number]  [id number]  Date of Service: 04/24/2018  Chief Complaint  Patient presents with  . Follow-up    Telephone visit     The patient has been contacted via telephone for follow up visit due to concerns for spread of novel coronavirus. She is having increased frequency of RA. Increased joint pain and swelling. Biggest complaint is severe fatigue. Somedays, she feels like she can barely put one foot in front of the other.  Has had some problems with abscessed tooth. Went ot the dentist and they believe she may need to have route canal. In the meantime, they started her on amoxicillin, which she has completed. She states that this did help, but two days after she finished the antibiotics, the tooth pain started again. She cannot make visit with the dentis, as they are currently seeing emergencies only until the restrictions are lifted regarding COVID 19.  She takes takes Vyvanse 70mg  daily as needed. Helps to keep her focused ano on track and focused while at work. No negative side effects t      Current Medication: Outpatient Encounter Medications as of 04/24/2018  Medication Sig  . ALPRAZolam (XANAX) 0.25 MG tablet Take 1 tablet (0.25 mg total) by mouth daily as needed for anxiety.  . cyanocobalamin (,VITAMIN B-12,) 1000 MCG/ML injection Inject 1 mL (1,000 mcg total) into the muscle once a week.  Marland Kitchen ibuprofen (ADVIL,MOTRIN) 800 MG tablet Take 1 tablet (800 mg total) by mouth every 8 (eight) hours as needed.  Marland Kitchen levonorgestrel (MIRENA) 20 MCG/24HR IUD 1 each by Intrauterine route once.  . linaclotide (LINZESS) 145 MCG CAPS capsule Take 1 capsule (145 mcg total) by mouth daily before breakfast.  . lisdexamfetamine (VYVANSE) 70 MG capsule Take 1 capsule (70 mg total) by mouth daily.  Marland Kitchen omeprazole (PRILOSEC) 40 MG capsule Take 1 capsule (40 mg total) by  mouth 2 (two) times daily.  . ondansetron (ZOFRAN) 8 MG tablet Take by mouth every 8 (eight) hours as needed for nausea or vomiting.  . Vitamin D, Ergocalciferol, (DRISDOL) 50000 units CAPS capsule Take 1 capsule (50,000 Units total) by mouth every 7 (seven) days.  . [DISCONTINUED] lisdexamfetamine (VYVANSE) 70 MG capsule Take 1 capsule (70 mg total) by mouth daily.  . [DISCONTINUED] lisdexamfetamine (VYVANSE) 70 MG capsule Take 1 capsule (70 mg total) by mouth daily.  . [DISCONTINUED] lisdexamfetamine (VYVANSE) 70 MG capsule Take 1 capsule (70 mg total) by mouth daily.  . ciprofloxacin (CIPRO) 500 MG tablet Take 1 tablet (500 mg total) by mouth 2 (two) times daily. (Patient not taking: Reported on 04/24/2018)  . fluconazole (DIFLUCAN) 150 MG tablet Take 1 tablet po once. May repeat dose in 3 days as needed for persistent symptoms.  . fluticasone (FLONASE) 50 MCG/ACT nasal spray Place 2 sprays into both nostrils daily for 10 days.  Marland Kitchen sulfamethoxazole-trimethoprim (BACTRIM DS,SEPTRA DS) 800-160 MG tablet Take 1 tablet by mouth 2 (two) times daily.   No facility-administered encounter medications on file as of 04/24/2018.     Surgical History: Past Surgical History:  Procedure Laterality Date  . CESAREAN SECTION    . LEEP  2006  . LITHOTRIPSY  2003    Medical History: Past Medical History:  Diagnosis Date  . Pelvic pain in female   . Raynaud's disease   . Rheumatoid arthritis ([place])   . Vaginal Pap smear,  abnormal   . Vitamin B 12 deficiency     Family History: Family History  Problem Relation Age of Onset  . Diabetes Mother   . Diabetes Father   . Diabetes Maternal Grandfather     Social History   Socioeconomic History  . Marital status: Married    Spouse name: Not on file  . Number of children: Not on file  . Years of education: Not on file  . Highest education level: Not on file  Occupational History  . Not on file  Social Needs  . Financial resource strain: Not on  file  . Food insecurity:    Worry: Not on file    Inability: Not on file  . Transportation needs:    Medical: Not on file    Non-medical: Not on file  Tobacco Use  . Smoking status: Never Smoker  . Smokeless tobacco: Never Used  Substance and Sexual Activity  . Alcohol use: Not Currently  . Drug use: No  . Sexual activity: Yes    Birth control/protection: I.U.D.    Comment: mirena  Lifestyle  . Physical activity:    Days per week: Not on file    Minutes per session: Not on file  . Stress: Not on file  Relationships  . Social connections:    Talks on phone: Not on file    Gets together: Not on file    Attends religious service: Not on file    Active member of club or organization: Not on file    Attends meetings of clubs or organizations: Not on file    Relationship status: Not on file  . Intimate partner violence:    Fear of current or ex partner: Not on file    Emotionally abused: Not on file    Physically abused: Not on file    Forced sexual activity: Not on file  Other Topics Concern  . Not on file  Social History Narrative  . Not on file      Review of Systems  Constitutional: Positive for fatigue. Negative for activity change, chills and unexpected weight change.       Fatigue is severe. Some days, she feels so tired she can barely put one foot in front of the other.   HENT: Positive for dental problem. Negative for congestion, postnasal drip, rhinorrhea, sneezing, sore throat and voice change.        Abscessed tooth. Possibly needs root canal.   Respiratory: Negative for cough, chest tightness, shortness of breath and wheezing.   Cardiovascular: Negative for chest pain and palpitations.  Gastrointestinal: Negative for abdominal pain, constipation, diarrhea, nausea and vomiting.  Endocrine: Negative for heat intolerance, polydipsia and polyuria.  Musculoskeletal: Positive for arthralgias, joint swelling and myalgias. Negative for back pain and neck pain.        Joint tenderness in many areas. Particularly effecting hips, knees, left ankle, elbows, wrists, and hands.   Skin: Positive for color change. Negative for rash.       Darkening/blueish tinge to the fingers and toes when cold. Has recently been getting more frequent and severe.  Allergic/Immunologic: Positive for environmental allergies.  Neurological: Negative for tremors, numbness and headaches.  Hematological: Negative for adenopathy. Does not bruise/bleed easily.  Psychiatric/Behavioral: Positive for decreased concentration. Negative for behavioral problems (Depression), sleep disturbance and suicidal ideas. The patient is nervous/anxious.        Improved focus and anxiety on Vyvanse 70mg  daily.     Today's Vitals  04/24/18 1304  BP: 106/72  Pulse: 82  Resp: 16  Weight: 164 lb (74.4 kg)  Height: 5\' 9"  (1.753 m)   Body mass index is 24.22 kg/m.  Assessment/Plan: 1. Abscessed tooth Will do round bactrim ds bid for 14 days. Gargle with antiseptic mouth wash at least twice daily.  - sulfamethoxazole-trimethoprim (BACTRIM DS,SEPTRA DS) 800-160 MG tablet; Take 1 tablet by mouth 2 (two) times daily.  Dispense: 20 tablet; Refill: 0  2. Vaginal candidiasis - fluconazole (DIFLUCAN) 150 MG tablet; Take 1 tablet po once. May repeat dose in 3 days as needed for persistent symptoms.  Dispense: 3 tablet; Refill: 0  3. Attention and concentration deficit Refilled byvanse 70mg  daily for next three months. Three 30 day prescriptions went to her pharmacy. Dates are 04/24/2018, 05/23/2018, and 06/20/2018.  - lisdexamfetamine (VYVANSE) 70 MG capsule; Take 1 capsule (70 mg total) by mouth daily.  Dispense: 30 capsule; Refill: 0  4. Rheumatoid arthritis with positive rheumatoid factor, involving unspecified site [facility]) Upcoming new patient visit with rheumatology at [facility].   General Counseling: syrita dovel understanding of the findings of todays visit and agrees with plan of treatment. I have  discussed any further diagnostic evaluation that may be needed or ordered today. We also reviewed her medications today. she has been encouraged to call the office with any questions or concerns that should arise related to todays visit.  Refilled Controlled medications today. Reviewed risks and possible side effects associated with taking Stimulants. Combination of these drugs with other psychotropic medications could cause dizziness and drowsiness. Pt needs to Monitor symptoms and exercise caution in driving and operating heavy machinery to avoid damages to oneself, to others and to the surroundings. Patient verbalized understanding in this matter. Dependence and abuse for these drugs will be monitored closely. A Controlled substance policy and procedure is on file which allows [facility] to order a urine drug screen test at any visit. Patient understands and agrees with the plan..  This patient was seen by Leretha Pol FNP Collaboration with Dr Lavera Guise as a part of collaborative care agreement  Meds ordered this encounter  Medications  . sulfamethoxazole-trimethoprim (BACTRIM DS,SEPTRA DS) 800-160 MG tablet    Sig: Take 1 tablet by mouth 2 (two) times daily.    Dispense:  20 tablet    Refill:  0    Order Specific Question:   Supervising Provider    Answer:   Lavera Guise [[id number]]  . fluconazole (DIFLUCAN) 150 MG tablet    Sig: Take 1 tablet po once. May repeat dose in 3 days as needed for persistent symptoms.    Dispense:  3 tablet    Refill:  0    Order Specific Question:   Supervising Provider    Answer:   Lavera Guise [[id number]]  . DISCONTD: lisdexamfetamine (VYVANSE) 70 MG capsule    Sig: Take 1 capsule (70 mg total) by mouth daily.    Dispense:  30 capsule    Refill:  0    Order Specific Question:   Supervising Provider    Answer:   Lavera Guise [[id number]]  . DISCONTD: lisdexamfetamine (VYVANSE) 70 MG capsule    Sig: Take 1 capsule (70 mg total) by mouth daily.     Dispense:  30 capsule    Refill:  0    Fill after 05/23/2018    Order Specific Question:   Supervising Provider    Answer:   Lavera Guise [[id number]]  .  lisdexamfetamine (VYVANSE) 70 MG capsule    Sig: Take 1 capsule (70 mg total) by mouth daily.    Dispense:  30 capsule    Refill:  0    Fill after 06/20/2018    Order Specific Question:   Supervising Provider    Answer:   Lavera Guise [[id number]]    Time spent: 25 Minutes      Dr Lavera Guise Internal medicine

## 2018-04-26 NOTE — Addendum Note (Signed)
Addended by: Leretha Pol on: 04/26/2018 05:40 PM   Modules accepted: Level of Service

## 2018-05-22 DIAGNOSIS — R140 Abdominal distension (gaseous): Secondary | ICD-10-CM | POA: Insufficient documentation

## 2018-05-22 DIAGNOSIS — R1011 Right upper quadrant pain: Secondary | ICD-10-CM | POA: Diagnosis not present

## 2018-05-22 DIAGNOSIS — D892 Hypergammaglobulinemia, unspecified: Secondary | ICD-10-CM | POA: Diagnosis not present

## 2018-05-22 DIAGNOSIS — R109 Unspecified abdominal pain: Secondary | ICD-10-CM | POA: Diagnosis not present

## 2018-05-22 DIAGNOSIS — Z8739 Personal history of other diseases of the musculoskeletal system and connective tissue: Secondary | ICD-10-CM | POA: Diagnosis not present

## 2018-05-22 DIAGNOSIS — M359 Systemic involvement of connective tissue, unspecified: Secondary | ICD-10-CM | POA: Insufficient documentation

## 2018-06-02 DIAGNOSIS — M359 Systemic involvement of connective tissue, unspecified: Secondary | ICD-10-CM | POA: Diagnosis not present

## 2018-06-08 DIAGNOSIS — Z789 Other specified health status: Secondary | ICD-10-CM | POA: Diagnosis not present

## 2018-06-08 DIAGNOSIS — R109 Unspecified abdominal pain: Secondary | ICD-10-CM | POA: Diagnosis not present

## 2018-06-08 DIAGNOSIS — R140 Abdominal distension (gaseous): Secondary | ICD-10-CM | POA: Diagnosis not present

## 2018-07-17 IMAGING — US US ABDOMEN LIMITED
1 series · 14 of 25 positions shown · non-contrast
Comparison: May 13, 2009 abdominal ultrasound

CLINICAL DATA: Right upper quadrant pain for 4 months, progressing

EXAM:
US ABDOMEN LIMITED - RIGHT UPPER QUADRANT

[Series 1: us abdomen limited · 0.10mm/px · 93 acquisitions, 14 frames shown]
[im 1/93]
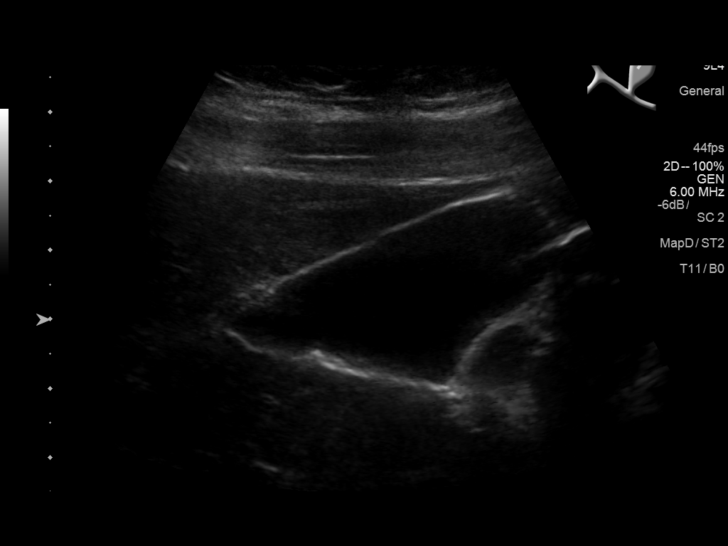
[im 8/93]
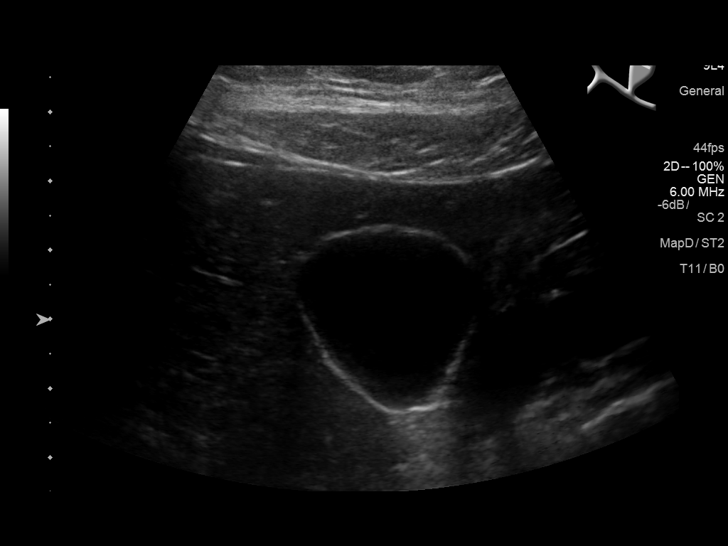
[im 16/93]
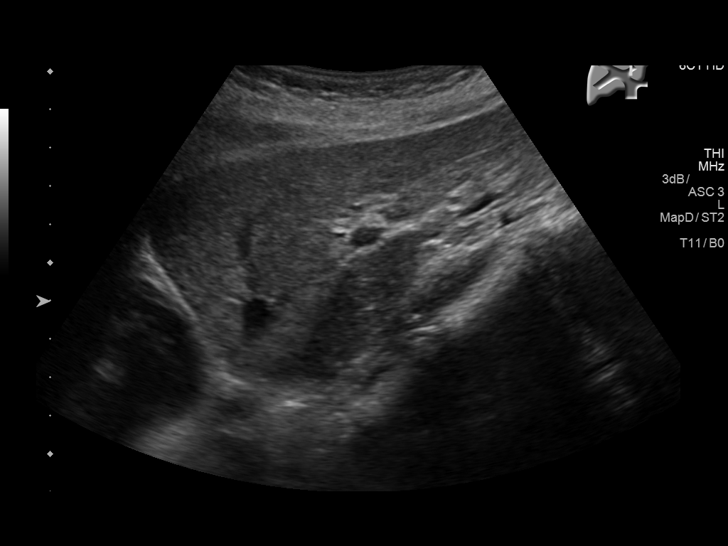
[im 24/93]
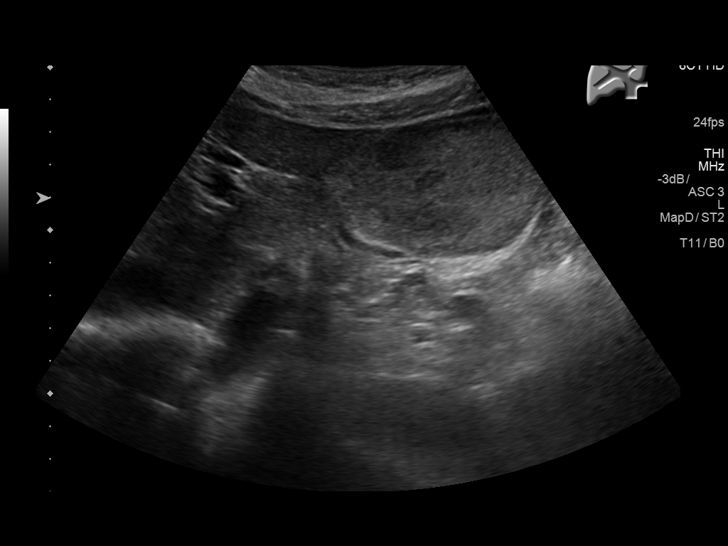
[im 31/93]
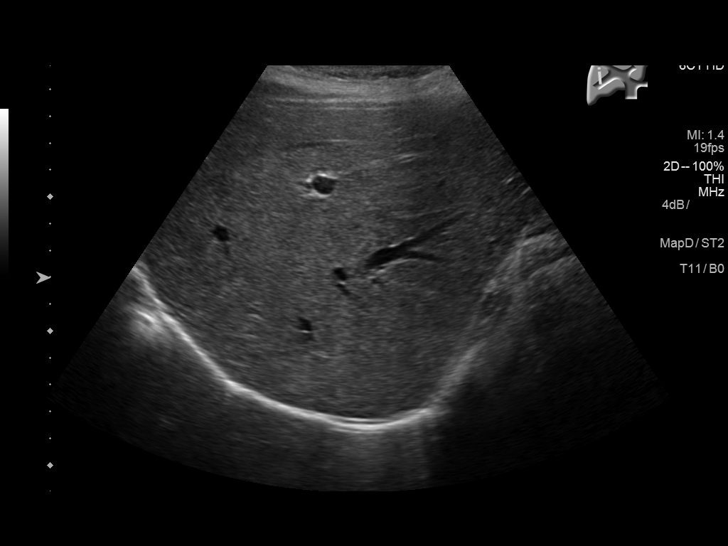
[im 35/93]
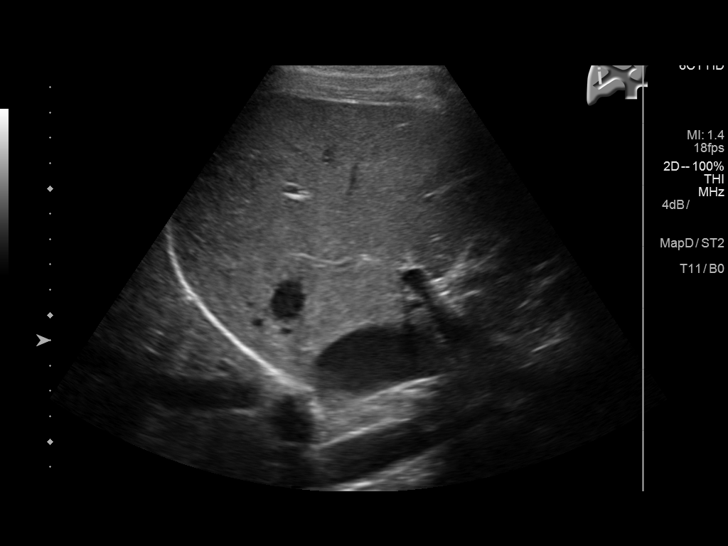
[im 43/93]
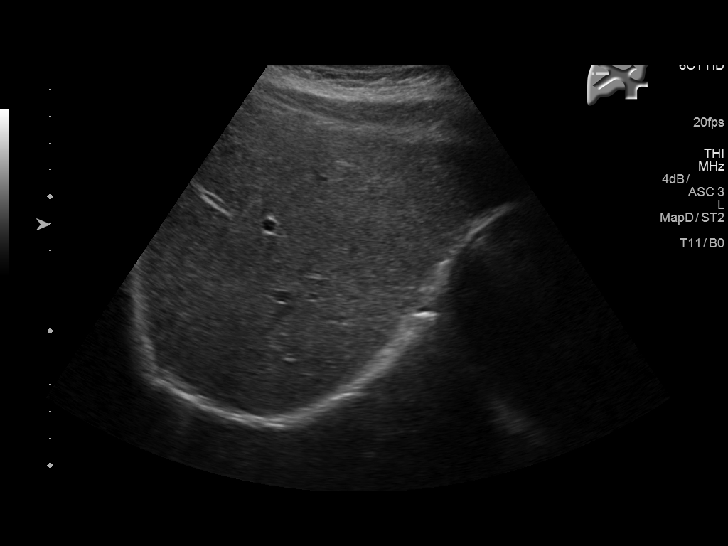
[im 50/93]
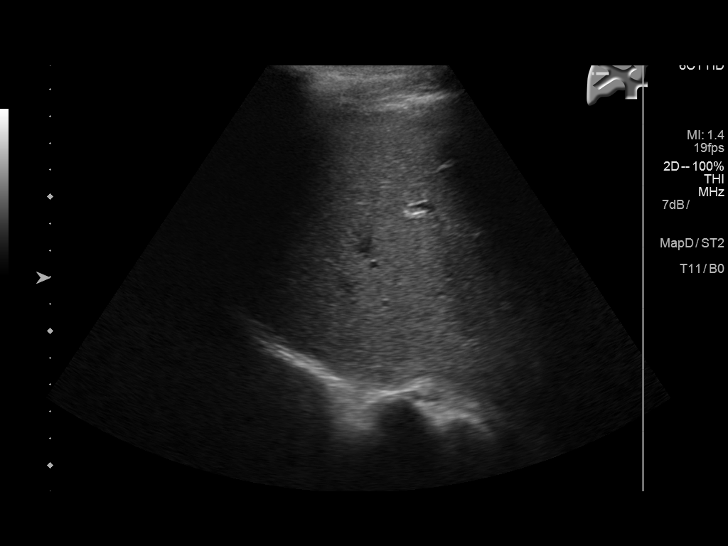
[im 58/93]
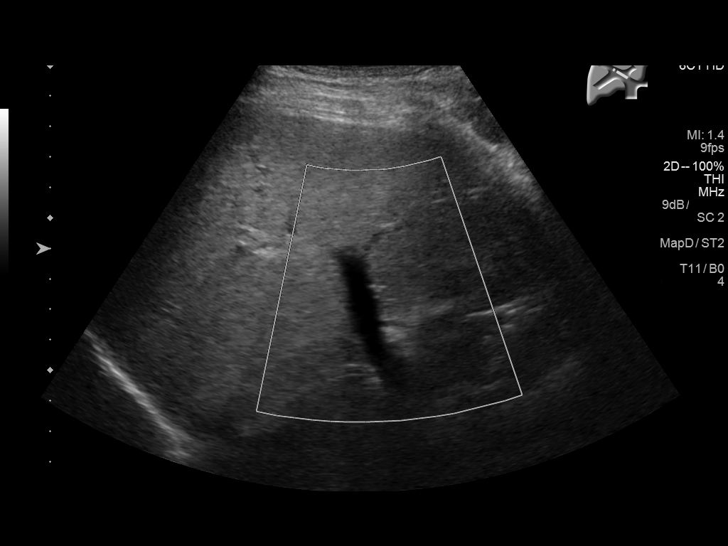
[im 62/93]
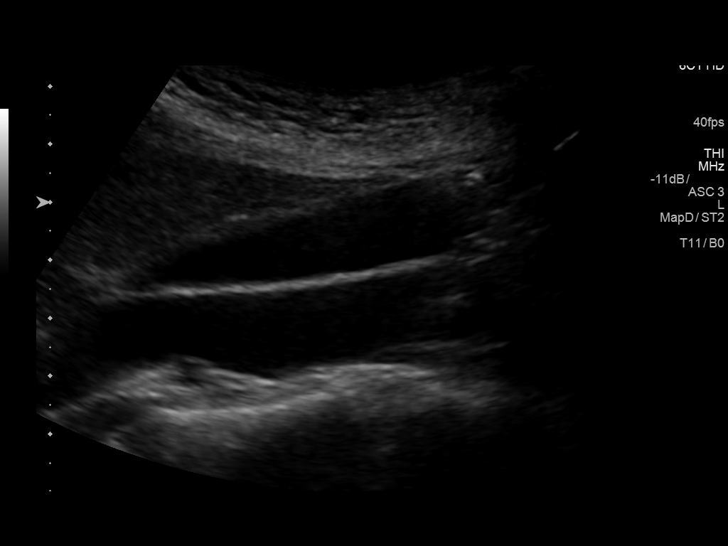
[im 70/93]
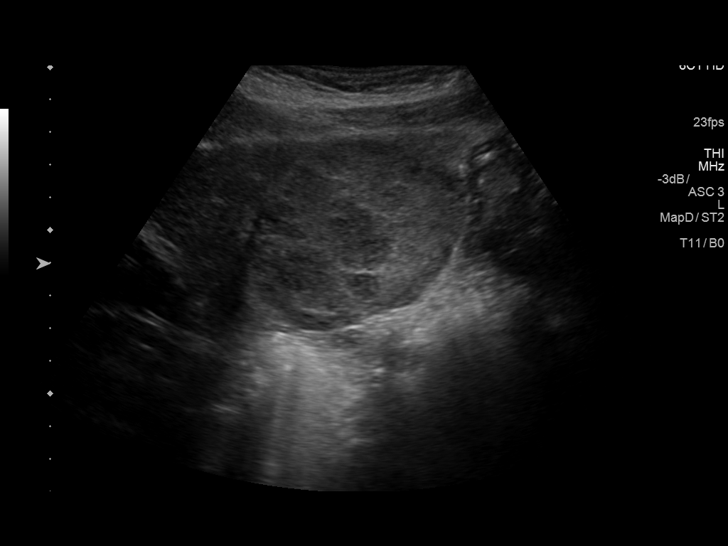
[im 77/93]
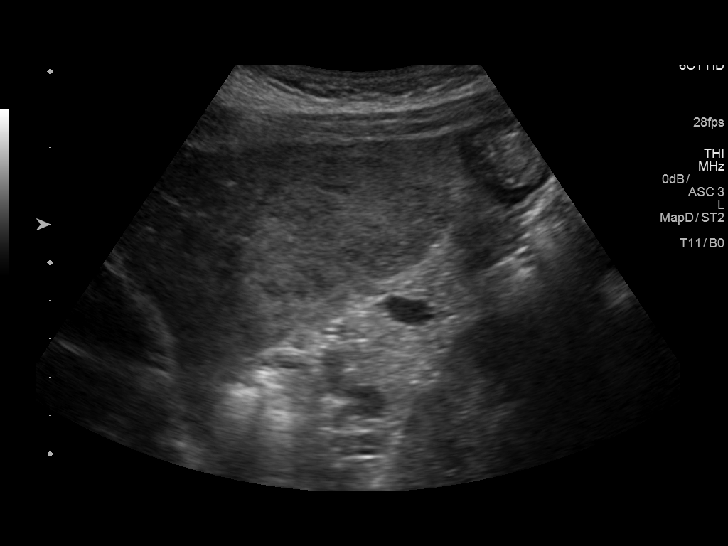
[im 85/93]
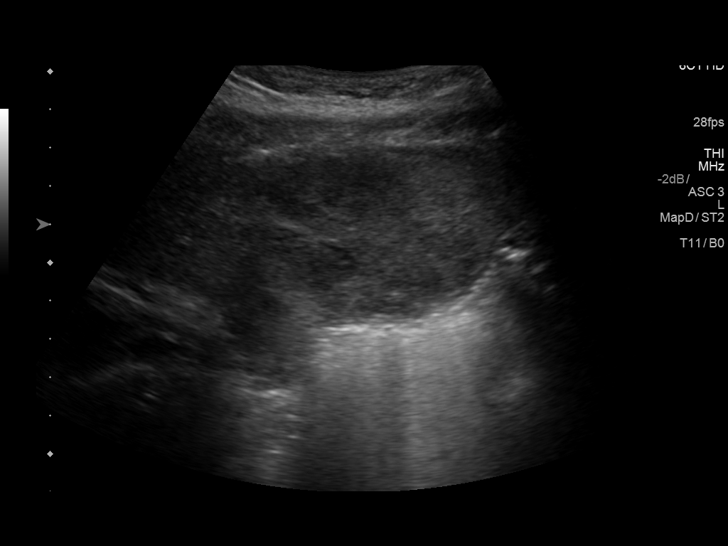
[im 93/93]
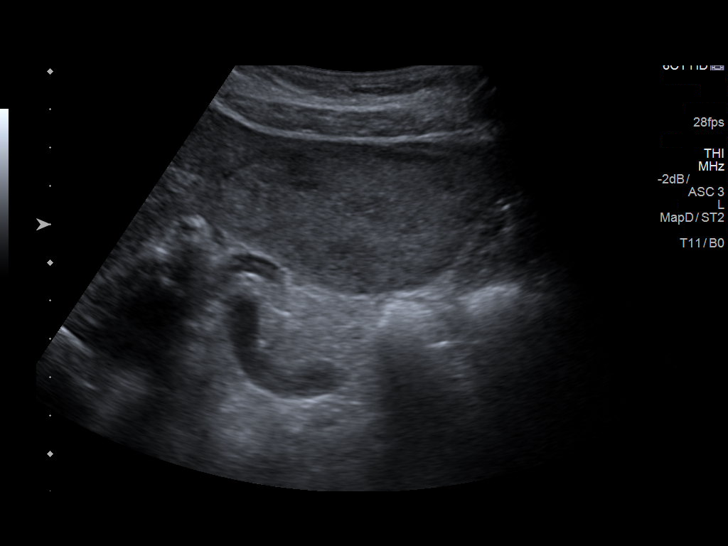

[14 of 25 positions shown; findings below may reference images not displayed]

FINDINGS: Gallbladder:

No gallstones or wall thickening visualized. There is no
pericholecystic fluid. No sonographic Murphy sign noted by
sonographer.

Common bile duct:

Diameter: 3 mm. There is no intrahepatic or extrahepatic biliary
duct dilatation.

Liver:

There is a mass occupying much of the left lobe of the liver
measuring 5.5 x 7.2 x 6.5 cm. This mass has mixed echogenicity.
Within normal limits in parenchymal echogenicity.
IMPRESSION: **An incidental finding of potential clinical significance has been
found. Dominant mass arising from the left lobe of the liver.
Neoplastic etiology must be of concern given this appearance. Advise
pre and post contrast CT or MR of the liver to further evaluate.**

## 2018-07-25 ENCOUNTER — Other Ambulatory Visit: Payer: Self-pay

## 2018-07-25 ENCOUNTER — Ambulatory Visit: Payer: 59 | Admitting: Nurse Practitioner

## 2018-07-25 ENCOUNTER — Encounter: Payer: Self-pay | Admitting: Nurse Practitioner

## 2018-07-25 VITALS — BP 107/60 | HR 90 | Temp 99.0°F | Resp 16 | Ht 69.0 in | Wt 160.0 lb

## 2018-07-25 DIAGNOSIS — M059 Rheumatoid arthritis with rheumatoid factor, unspecified: Secondary | ICD-10-CM | POA: Diagnosis not present

## 2018-07-25 DIAGNOSIS — Z79899 Other long term (current) drug therapy: Secondary | ICD-10-CM | POA: Diagnosis not present

## 2018-07-25 DIAGNOSIS — R41840 Attention and concentration deficit: Secondary | ICD-10-CM

## 2018-07-25 LAB — POCT URINE DRUG SCREEN
POC Amphetamine UR: POSITIVE — AB
POC BENZODIAZEPINES UR: NOT DETECTED
POC Barbiturate UR: NOT DETECTED
POC Cocaine UR: NOT DETECTED
POC Ecstasy UR: NOT DETECTED
POC Marijuana UR: NOT DETECTED
POC Methadone UR: NOT DETECTED
POC Methamphetamine UR: NOT DETECTED
POC Opiate Ur: NOT DETECTED
POC Oxycodone UR: NOT DETECTED
POC PHENCYCLIDINE UR: NOT DETECTED
POC TRICYCLICS UR: NOT DETECTED

## 2018-07-25 MED ORDER — LISDEXAMFETAMINE DIMESYLATE 70 MG PO CAPS: 70.0000 mg | ORAL_CAPSULE | Freq: Every day | ORAL | 0 refills | Status: DC

## 2018-07-25 NOTE — Progress Notes (Signed)
[facility] [address] [place], [place] [address]  Internal MEDICINE  Office Visit Note  Patient Name: Haley Wilkins  [id number]  [id number]  Date of Service: 07/29/2018  Chief Complaint  Patient presents with  . Medical Management of Chronic Issues    3 month routine follow up, medication refills    She is having increased frequency of RA. Increased joint pain and swelling. Biggest complaint is severe fatigue. Somedays, she feels like she can barely put one foot in front of the other. She has had initial appointment with new rheumatologist at [facility]. Had a lot of blood work done. Feels as though she has sjogren's disease and early onset of Lupus, rather thant RA. she is now on plaquenil. Taking 400mg  every evening. Has noted some improvement. Improved fatigue. She was told that she is also at risk for developing She takes takes Vyvanse 70mg  daily as needed. Helps to keep her focused ano on track and focused while at work. No negative side effects . She is due to have refills today.       Current Medication: Outpatient Encounter Medications as of 07/25/2018  Medication Sig  . ALPRAZolam (XANAX) 0.25 MG tablet Take 1 tablet (0.25 mg total) by mouth daily as needed for anxiety.  . cyanocobalamin (,VITAMIN B-12,) 1000 MCG/ML injection Inject 1 mL (1,000 mcg total) into the muscle once a week.  . fluconazole (DIFLUCAN) 150 MG tablet Take 1 tablet po once. May repeat dose in 3 days as needed for persistent symptoms.  Marland Kitchen ibuprofen (ADVIL,MOTRIN) 800 MG tablet Take 1 tablet (800 mg total) by mouth every 8 (eight) hours as needed.  Marland Kitchen levonorgestrel (MIRENA) 20 MCG/24HR IUD 1 each by Intrauterine route once.  . linaclotide (LINZESS) 145 MCG CAPS capsule Take 1 capsule (145 mcg total) by mouth daily before breakfast.  . lisdexamfetamine (VYVANSE) 70 MG capsule Take 1 capsule (70 mg total) by mouth daily.  Marland Kitchen omeprazole (PRILOSEC) 40 MG capsule Take 1 capsule (40 mg total) by mouth 2  (two) times daily.  . ondansetron (ZOFRAN) 8 MG tablet Take by mouth every 8 (eight) hours as needed for nausea or vomiting.  . sulfamethoxazole-trimethoprim (BACTRIM DS,SEPTRA DS) 800-160 MG tablet Take 1 tablet by mouth 2 (two) times daily.  . Vitamin D, Ergocalciferol, (DRISDOL) 50000 units CAPS capsule Take 1 capsule (50,000 Units total) by mouth every 7 (seven) days.  . [DISCONTINUED] lisdexamfetamine (VYVANSE) 70 MG capsule Take 1 capsule (70 mg total) by mouth daily.  . [DISCONTINUED] lisdexamfetamine (VYVANSE) 70 MG capsule Take 1 capsule (70 mg total) by mouth daily.  . [DISCONTINUED] lisdexamfetamine (VYVANSE) 70 MG capsule Take 1 capsule (70 mg total) by mouth daily.  . fluticasone (FLONASE) 50 MCG/ACT nasal spray Place 2 sprays into both nostrils daily for 10 days.  . [DISCONTINUED] ciprofloxacin (CIPRO) 500 MG tablet Take 1 tablet (500 mg total) by mouth 2 (two) times daily. (Patient not taking: Reported on 04/24/2018)   No facility-administered encounter medications on file as of 07/25/2018.     Surgical History: Past Surgical History:  Procedure Laterality Date  . CESAREAN SECTION    . LEEP  2006  . LITHOTRIPSY  2003    Medical History: Past Medical History:  Diagnosis Date  . Pelvic pain in female   . Raynaud's disease   . Rheumatoid arthritis ([place])   . Vaginal Pap smear, abnormal   . Vitamin B 12 deficiency     Family History: Family History  Problem Relation Age  of Onset  . Diabetes Mother   . Diabetes Father   . Diabetes Maternal Grandfather     Social History   Socioeconomic History  . Marital status: Married    Spouse name: Not on file  . Number of children: Not on file  . Years of education: Not on file  . Highest education level: Not on file  Occupational History  . Not on file  Social Needs  . Financial resource strain: Not on file  . Food insecurity    Worry: Not on file    Inability: Not on file  . Transportation needs    Medical: Not on  file    Non-medical: Not on file  Tobacco Use  . Smoking status: Never Smoker  . Smokeless tobacco: Never Used  Substance and Sexual Activity  . Alcohol use: Not Currently  . Drug use: No  . Sexual activity: Yes    Birth control/protection: I.U.D.    Comment: mirena  Lifestyle  . Physical activity    Days per week: Not on file    Minutes per session: Not on file  . Stress: Not on file  Relationships  . Social Herbalist on phone: Not on file    Gets together: Not on file    Attends religious service: Not on file    Active member of club or organization: Not on file    Attends meetings of clubs or organizations: Not on file    Relationship status: Not on file  . Intimate partner violence    Fear of current or ex partner: Not on file    Emotionally abused: Not on file    Physically abused: Not on file    Forced sexual activity: Not on file  Other Topics Concern  . Not on file  Social History Narrative  . Not on file      Review of Systems  Constitutional: Positive for fatigue. Negative for activity change, chills and unexpected weight change.       Fatigue improved since she saw rheumatologist and was started on plaquenil.   HENT: Negative for congestion, dental problem, postnasal drip, rhinorrhea, sneezing, sore throat and voice change.   Respiratory: Negative for cough, chest tightness, shortness of breath and wheezing.   Cardiovascular: Negative for chest pain and palpitations.  Gastrointestinal: Negative for abdominal pain, constipation, diarrhea, nausea and vomiting.  Endocrine: Negative for heat intolerance, polydipsia and polyuria.  Musculoskeletal: Positive for arthralgias, joint swelling and myalgias. Negative for back pain and neck pain.       Joint tenderness in many areas. Particularly effecting hips, knees, left ankle, elbows, wrists, and hands.   Skin: Negative for rash.  Allergic/Immunologic: Positive for environmental allergies.  Neurological:  Negative for tremors, numbness and headaches.  Hematological: Negative for adenopathy. Does not bruise/bleed easily.  Psychiatric/Behavioral: Positive for decreased concentration. Negative for behavioral problems (Depression), sleep disturbance and suicidal ideas. The patient is nervous/anxious.        Improved focus and anxiety on Vyvanse 70mg  daily.     Today's Vitals   07/25/18 1600  BP: 107/60  Pulse: 90  Resp: 16  Temp: 99 F (37.2 C)  SpO2: 100%  Weight: 160 lb (72.6 kg)  Height: 5\' 9"  (1.753 m)   Body mass index is 23.63 kg/m.  Physical Exam Vitals signs and nursing note reviewed.  Constitutional:      General: She is not in acute distress.    Appearance: Normal appearance. She is  well-developed. She is not diaphoretic.  HENT:     Head: Normocephalic and atraumatic.     Nose: Nose normal.     Mouth/Throat:     Pharynx: No oropharyngeal exudate.  Eyes:     Conjunctiva/sclera: Conjunctivae normal.     Pupils: Pupils are equal, round, and reactive to light.  Neck:     Musculoskeletal: Normal range of motion and neck supple.     Thyroid: No thyromegaly.     Vascular: No JVD.     Trachea: No tracheal deviation.  Cardiovascular:     Rate and Rhythm: Normal rate and regular rhythm.     Heart sounds: Normal heart sounds. No murmur. No friction rub. No gallop.   Pulmonary:     Effort: Pulmonary effort is normal. No respiratory distress.     Breath sounds: Normal breath sounds. No wheezing or rales.  Chest:     Chest wall: No tenderness.  Abdominal:     General: Bowel sounds are normal.     Palpations: Abdomen is soft.     Tenderness: There is no abdominal tenderness.  Musculoskeletal: Normal range of motion.     Comments: Generalized joint pain with point tenderness present.   Lymphadenopathy:     Cervical: No cervical adenopathy.  Skin:    General: Skin is warm and dry.  Neurological:     General: No focal deficit present.     Mental Status: She is alert and  oriented to person, place, and time.     Cranial Nerves: No cranial nerve deficit.  Psychiatric:        Behavior: Behavior normal.        Thought Content: Thought content normal.        Judgment: Judgment normal.    Assessment/Plan: 1. Rheumatoid arthritis with positive rheumatoid factor, involving unspecified site [facility]) Patient now seeing rheumatology and started on plaquenil. Doing better. Regular visits as scheduled.   2. Attention and concentration deficit May continue vyvanse 70mg  daily. Three 30 day prescriptions were sent to her pharmacy. Dates are 07/25/2018, 08/22/2018, and 09/20/2018.  - lisdexamfetamine (VYVANSE) 70 MG capsule; Take 1 capsule (70 mg total) by mouth daily.  Dispense: 30 capsule; Refill: 0  3. Encounter for long-term (current) use of medications - POCT Urine Drug Screen appropriately positive for AMP only.   General Counseling: ameliana brashear understanding of the findings of todays visit and agrees with plan of treatment. I have discussed any further diagnostic evaluation that may be needed or ordered today. We also reviewed her medications today. she has been encouraged to call the office with any questions or concerns that should arise related to todays visit.  Refilled Controlled medications today. Reviewed risks and possible side effects associated with taking Stimulants. Combination of these drugs with other psychotropic medications could cause dizziness and drowsiness. Pt needs to Monitor symptoms and exercise caution in driving and operating heavy machinery to avoid damages to oneself, to others and to the surroundings. Patient verbalized understanding in this matter. Dependence and abuse for these drugs will be monitored closely. A Controlled substance policy and procedure is on file which allows [facility] to order a urine drug screen test at any visit. Patient understands and agrees with the plan..  This patient was seen by Leretha Pol FNP  Collaboration with Dr Lavera Guise as a part of collaborative care agreement  Orders Placed This Encounter  Procedures  . POCT Urine Drug Screen    Meds ordered this  encounter  Medications  . DISCONTD: lisdexamfetamine (VYVANSE) 70 MG capsule    Sig: Take 1 capsule (70 mg total) by mouth daily.    Dispense:  30 capsule    Refill:  0    Order Specific Question:   Supervising Provider    Answer:   Lavera Guise [[id number]]  . DISCONTD: lisdexamfetamine (VYVANSE) 70 MG capsule    Sig: Take 1 capsule (70 mg total) by mouth daily.    Dispense:  30 capsule    Refill:  0    Fill after 08/22/2018    Order Specific Question:   Supervising Provider    Answer:   Lavera Guise [[id number]]  . lisdexamfetamine (VYVANSE) 70 MG capsule    Sig: Take 1 capsule (70 mg total) by mouth daily.    Dispense:  30 capsule    Refill:  0    Fill after 09/20/2018    Order Specific Question:   Supervising Provider    Answer:   Lavera Guise [[id number]]    Time spent: 25 Minutes      Dr Lavera Guise Internal medicine

## 2018-09-06 DIAGNOSIS — M359 Systemic involvement of connective tissue, unspecified: Secondary | ICD-10-CM | POA: Diagnosis not present

## 2018-09-06 DIAGNOSIS — Z79899 Other long term (current) drug therapy: Secondary | ICD-10-CM | POA: Diagnosis not present

## 2018-10-26 ENCOUNTER — Other Ambulatory Visit: Payer: Self-pay | Admitting: Nurse Practitioner

## 2018-10-26 DIAGNOSIS — R41840 Attention and concentration deficit: Secondary | ICD-10-CM

## 2018-10-26 MED ORDER — LISDEXAMFETAMINE DIMESYLATE 70 MG PO CAPS: 70.0000 mg | ORAL_CAPSULE | Freq: Every day | ORAL | 0 refills | Status: DC

## 2018-10-26 NOTE — Progress Notes (Signed)
Renewed prescription for Vyvanse 70mg  daily and sent to [place] per pharmacy request.

## 2018-10-30 ENCOUNTER — Other Ambulatory Visit: Payer: Self-pay

## 2018-10-30 ENCOUNTER — Ambulatory Visit: Payer: 59 | Admitting: Nurse Practitioner

## 2018-10-30 ENCOUNTER — Encounter: Payer: Self-pay | Admitting: Nurse Practitioner

## 2018-10-30 VITALS — BP 96/74 | HR 88 | Temp 97.8°F | Resp 16 | Ht 69.0 in | Wt 162.0 lb

## 2018-10-30 DIAGNOSIS — N390 Urinary tract infection, site not specified: Secondary | ICD-10-CM

## 2018-10-30 DIAGNOSIS — R41840 Attention and concentration deficit: Secondary | ICD-10-CM

## 2018-10-30 DIAGNOSIS — H60503 Unspecified acute noninfective otitis externa, bilateral: Secondary | ICD-10-CM | POA: Diagnosis not present

## 2018-10-30 DIAGNOSIS — R300 Dysuria: Secondary | ICD-10-CM | POA: Diagnosis not present

## 2018-10-30 LAB — POCT URINALYSIS DIPSTICK
Bilirubin, UA: NEGATIVE
Blood, UA: NEGATIVE
Glucose, UA: NEGATIVE
Ketones, UA: NEGATIVE
Nitrite, UA: NEGATIVE
Protein, UA: NEGATIVE
Spec Grav, UA: 1.025 (ref 1.010–1.025)
Urobilinogen, UA: 0.2 E.U./dL
pH, UA: 6.5 (ref 5.0–8.0)

## 2018-10-30 MED ORDER — LISDEXAMFETAMINE DIMESYLATE 70 MG PO CAPS: 70.0000 mg | ORAL_CAPSULE | Freq: Every day | ORAL | 0 refills | Status: DC

## 2018-10-30 MED ORDER — CIPROFLOXACIN-DEXAMETHASONE 0.3-0.1 % OT SUSP: 4.0000 [drp] | Freq: Two times a day (BID) | OTIC | 0 refills | Status: DC

## 2018-10-30 MED ORDER — NITROFURANTOIN MONOHYD MACRO 100 MG PO CAPS: 100.0000 mg | ORAL_CAPSULE | Freq: Two times a day (BID) | ORAL | 0 refills | Status: DC

## 2018-10-30 NOTE — Progress Notes (Signed)
[facility] [place], [place] [address]  Internal MEDICINE  Office Visit Note  Patient Name: Haley Wilkins  [id number]  [id number]  Date of Service: 11/08/2018  Chief Complaint  Patient presents with  . Arthritis  . Dysuria    urinary frequency    The patient is here for follow up visit. She is having urinary frequency, especially at night. Has had to get up several times at night to use the bathroom.  Last night, she had some right lower quadrant pain along with this.  She also states that she has been having trouble with her ears. They feel full of fluid and like air is going in and out of the ears.       Current Medication: Outpatient Encounter Medications as of 10/30/2018  Medication Sig  . ALPRAZolam (XANAX) 0.25 MG tablet Take 1 tablet (0.25 mg total) by mouth daily as needed for anxiety. (Patient not taking: Reported on 11/01/2018)  . cyanocobalamin (,VITAMIN B-12,) 1000 MCG/ML injection Inject 1 mL (1,000 mcg total) into the muscle once a week.  . fluconazole (DIFLUCAN) 150 MG tablet Take 1 tablet po once. May repeat dose in 3 days as needed for persistent symptoms.  Marland Kitchen levonorgestrel (MIRENA) 20 MCG/24HR IUD 1 each by Intrauterine route once.  . linaclotide (LINZESS) 145 MCG CAPS capsule Take 1 capsule (145 mcg total) by mouth daily before breakfast.  . lisdexamfetamine (VYVANSE) 70 MG capsule Take 1 capsule (70 mg total) by mouth daily.  Marland Kitchen omeprazole (PRILOSEC) 40 MG capsule Take 1 capsule (40 mg total) by mouth 2 (two) times daily.  . Vitamin D, Ergocalciferol, (DRISDOL) 50000 units CAPS capsule Take 1 capsule (50,000 Units total) by mouth every 7 (seven) days.  . [DISCONTINUED] ibuprofen (ADVIL,MOTRIN) 800 MG tablet Take 1 tablet (800 mg total) by mouth every 8 (eight) hours as needed. (Patient taking differently: Take 600 mg by mouth every 8 (eight) hours as needed. )  . [DISCONTINUED] lisdexamfetamine (VYVANSE) 70 MG capsule Take 1 capsule (70 mg  total) by mouth daily.  . [DISCONTINUED] lisdexamfetamine (VYVANSE) 70 MG capsule Take 1 capsule (70 mg total) by mouth daily.  . [DISCONTINUED] lisdexamfetamine (VYVANSE) 70 MG capsule Take 1 capsule (70 mg total) by mouth daily.  . [DISCONTINUED] sulfamethoxazole-trimethoprim (BACTRIM DS,SEPTRA DS) 800-160 MG tablet Take 1 tablet by mouth 2 (two) times daily.  . ciprofloxacin-dexamethasone (CIPRODEX) OTIC suspension Place 4 drops into both ears 2 (two) times daily.  . nitrofurantoin, macrocrystal-monohydrate, (MACROBID) 100 MG capsule Take 1 capsule (100 mg total) by mouth 2 (two) times daily.  . [DISCONTINUED] fluticasone (FLONASE) 50 MCG/ACT nasal spray Place 2 sprays into both nostrils daily for 10 days.  . [DISCONTINUED] ondansetron (ZOFRAN) 8 MG tablet Take by mouth every 8 (eight) hours as needed for nausea or vomiting.   No facility-administered encounter medications on file as of 10/30/2018.     Surgical History: Past Surgical History:  Procedure Laterality Date  . CESAREAN SECTION    . LEEP  2006  . LITHOTRIPSY  2003    Medical History: Past Medical History:  Diagnosis Date  . Pelvic pain in female   . Raynaud's disease   . Rheumatoid arthritis ([place])   . Vaginal Pap smear, abnormal   . Vitamin B 12 deficiency     Family History: Family History  Problem Relation Age of Onset  . Diabetes Mother   . Diabetes Father   . Diabetes Maternal Grandfather     Social History  Socioeconomic History  . Marital status: Married    Spouse name: Not on file  . Number of children: Not on file  . Years of education: Not on file  . Highest education level: Not on file  Occupational History  . Not on file  Social Needs  . Financial resource strain: Not on file  . Food insecurity    Worry: Not on file    Inability: Not on file  . Transportation needs    Medical: Not on file    Non-medical: Not on file  Tobacco Use  . Smoking status: Never Smoker  . Smokeless tobacco:  Never Used  Substance and Sexual Activity  . Alcohol use: Not Currently  . Drug use: No  . Sexual activity: Yes    Birth control/protection: I.U.D.    Comment: mirena  Lifestyle  . Physical activity    Days per week: Not on file    Minutes per session: Not on file  . Stress: Not on file  Relationships  . Social Herbalist on phone: Not on file    Gets together: Not on file    Attends religious service: Not on file    Active member of club or organization: Not on file    Attends meetings of clubs or organizations: Not on file    Relationship status: Not on file  . Intimate partner violence    Fear of current or ex partner: Not on file    Emotionally abused: Not on file    Physically abused: Not on file    Forced sexual activity: Not on file  Other Topics Concern  . Not on file  Social History Narrative  . Not on file      Review of Systems  Constitutional: Positive for activity change and fatigue. Negative for chills, fever and unexpected weight change.  HENT: Positive for congestion, ear pain and postnasal drip. Negative for rhinorrhea, sneezing and sore throat.   Respiratory: Negative for cough, chest tightness, shortness of breath and wheezing.   Cardiovascular: Negative for chest pain and palpitations.  Gastrointestinal: Negative for abdominal pain, constipation, diarrhea, nausea and vomiting.  Endocrine: Negative for cold intolerance, heat intolerance and polydipsia.  Genitourinary: Positive for enuresis, flank pain, frequency and urgency. Negative for dysuria.  Musculoskeletal: Positive for arthralgias, back pain and myalgias. Negative for joint swelling and neck pain.  Skin: Negative for rash.  Allergic/Immunologic: Negative for environmental allergies.  Neurological: Negative for dizziness, tremors, numbness and headaches.  Hematological: Negative for adenopathy. Does not bruise/bleed easily.  Psychiatric/Behavioral: Positive for decreased concentration.  Negative for behavioral problems (Depression), sleep disturbance and suicidal ideas. The patient is not nervous/anxious.     Today's Vitals   10/30/18 1535  BP: 96/74  Pulse: 88  Resp: 16  Temp: 97.8 F (36.6 C)  SpO2: 99%  Weight: 162 lb (73.5 kg)  Height: 5\' 9"  (1.753 m)   Body mass index is 23.92 kg/m.   Physical Exam Vitals signs and nursing note reviewed.  Constitutional:      Appearance: Normal appearance.  HENT:     Head: Normocephalic.     Right Ear: There is mastoid tenderness. Tympanic membrane is erythematous and bulging.     Left Ear: There is mastoid tenderness. Tympanic membrane is erythematous and bulging.  Eyes:     Extraocular Movements: Extraocular movements intact.     Pupils: Pupils are equal, round, and reactive to light.  Neck:     Musculoskeletal: Normal  range of motion and neck supple.  Cardiovascular:     Rate and Rhythm: Normal rate and regular rhythm.     Heart sounds: Normal heart sounds.  Abdominal:     General: Bowel sounds are normal.     Palpations: Abdomen is soft.     Tenderness: There is no abdominal tenderness.  Genitourinary:    Comments: Urine sample positive for trace WBC Lymphadenopathy:     Cervical: No cervical adenopathy.  Neurological:     Mental Status: She is alert and oriented to person, place, and time.  Psychiatric:        Mood and Affect: Mood normal.        Behavior: Behavior normal.        Thought Content: Thought content normal.        Judgment: Judgment normal.    Assessment/Plan:  1. Urinary tract infection without hematuria, site unspecified Start macrobid 100mg  bid for 7 days. Send urine for culture and sensitivity and adjust antibiotic therapy as indicated.  - nitrofurantoin, macrocrystal-monohydrate, (MACROBID) 100 MG capsule; Take 1 capsule (100 mg total) by mouth 2 (two) times daily.  Dispense: 14 capsule; Refill: 0  2. Acute otitis externa of both ears, unspecified type ciprodex ear drops. Use 4  drops in both ears twice daily for 7 days.  - ciprofloxacin-dexamethasone (CIPRODEX) OTIC suspension; Place 4 drops into both ears 2 (two) times daily.  Dispense: 7.5 mL; Refill: 0  3. Attention and concentration deficit Continue vyvanse 70mg  daily. Three 30 day prescriptions sent to her pharmacy. Dates are 10/30/2018, 11/28/2018, and 12/26/2018 - lisdexamfetamine (VYVANSE) 70 MG capsule; Take 1 capsule (70 mg total) by mouth daily.  Dispense: 30 capsule; Refill: 0  4. Dysuria - POCT Urinalysis Dipstick - CULTURE, URINE COMPREHENSIVE General Counseling: vaibhavi czaplewski understanding of the findings of todays visit and agrees with plan of treatment. I have discussed any further diagnostic evaluation that may be needed or ordered today. We also reviewed her medications today. she has been encouraged to call the office with any questions or concerns that should arise related to todays visit.  Refilled Controlled medications today. Reviewed risks and possible side effects associated with taking Stimulants. Combination of these drugs with other psychotropic medications could cause dizziness and drowsiness. Pt needs to Monitor symptoms and exercise caution in driving and operating heavy machinery to avoid damages to oneself, to others and to the surroundings. Patient verbalized understanding in this matter. Dependence and abuse for these drugs will be monitored closely. A Controlled substance policy and procedure is on file which allows [facility] to order a urine drug screen test at any visit. Patient understands and agrees with the plan..  This patient was seen by Leretha Pol FNP Collaboration with Dr Lavera Guise as a part of collaborative care agreement  Orders Placed This Encounter  Procedures  . CULTURE, URINE COMPREHENSIVE  . POCT Urinalysis Dipstick    Meds ordered this encounter  Medications  . nitrofurantoin, macrocrystal-monohydrate, (MACROBID) 100 MG capsule    Sig: Take  1 capsule (100 mg total) by mouth 2 (two) times daily.    Dispense:  14 capsule    Refill:  0    Order Specific Question:   Supervising Provider    Answer:   Lavera Guise [id number]  . ciprofloxacin-dexamethasone (CIPRODEX) OTIC suspension    Sig: Place 4 drops into both ears 2 (two) times daily.    Dispense:  7.5 mL    Refill:  0  Order Specific Question:   Supervising Provider    Answer:   Lavera Guise [id number]  . DISCONTD: lisdexamfetamine (VYVANSE) 70 MG capsule    Sig: Take 1 capsule (70 mg total) by mouth daily.    Dispense:  30 capsule    Refill:  0    Fill after 11/24/2018    Order Specific Question:   Supervising Provider    Answer:   Lavera Guise [id number]  . DISCONTD: lisdexamfetamine (VYVANSE) 70 MG capsule    Sig: Take 1 capsule (70 mg total) by mouth daily.    Dispense:  30 capsule    Refill:  0    Fill after 12/23/2018    Order Specific Question:   Supervising Provider    Answer:   Lavera Guise [id number]  . lisdexamfetamine (VYVANSE) 70 MG capsule    Sig: Take 1 capsule (70 mg total) by mouth daily.    Dispense:  30 capsule    Refill:  0    Fill after 01/20/2019    Order Specific Question:   Supervising Provider    Answer:   Lavera Guise [id number]    Time spent: 25 Minutes      Dr Lavera Guise Internal medicine

## 2018-11-01 ENCOUNTER — Encounter: Payer: Self-pay | Admitting: Obstetrics and Gynecology

## 2018-11-01 ENCOUNTER — Ambulatory Visit (INDEPENDENT_AMBULATORY_CARE_PROVIDER_SITE_OTHER): Payer: 59 | Admitting: Obstetrics and Gynecology

## 2018-11-01 ENCOUNTER — Other Ambulatory Visit: Payer: Self-pay

## 2018-11-01 ENCOUNTER — Other Ambulatory Visit (HOSPITAL_COMMUNITY)
Admission: RE | Admit: 2018-11-01 | Discharge: 2018-11-01 | Disposition: A | Discharge status: Home / Self Care | Payer: 59 | Source: Ambulatory Visit | Attending: Obstetrics and Gynecology | Admitting: Obstetrics and Gynecology

## 2018-11-01 VITALS — BP 104/68 | HR 82 | Ht 69.0 in | Wt 161.4 lb

## 2018-11-01 DIAGNOSIS — R87610 Atypical squamous cells of undetermined significance on cytologic smear of cervix (ASC-US): Secondary | ICD-10-CM

## 2018-11-01 DIAGNOSIS — R87810 Cervical high risk human papillomavirus (HPV) DNA test positive: Secondary | ICD-10-CM | POA: Insufficient documentation

## 2018-11-01 NOTE — Progress Notes (Signed)
  Subjective:     Patient ID: CYNDLE BASTEDO, female   DOB: 08/17/1980, 38 y.o.   MRN: [id number]  HPI Here for repeat pap as it is past due. Denies any concerns.  Review of Systems     Objective:   Physical Exam A&Ox4 Well groomed female in no distress Vitals with BMI 11/01/2018 10/30/2018 07/25/2018  Height 5\' 9"  5\' 9"  5\' 9"   Weight 161 lbs 6 oz 162 lbs 160 lbs  BMI 23.82 123456 [id number]  Systolic 123456 96 [id number]  Diastolic 68 74 60  Pulse 82 88 90  Pelvic exam: normal external genitalia, vulva, vagina, cervix, uterus and adnexa, PAP: Pap smear done today, IUD string noted..    Assessment:     Repeat pap only    Plan:     Pap obtained RTC 1 year or as needed.  Mirabelle Cyphers,CNM

## 2018-11-01 NOTE — Progress Notes (Signed)
Waiting on culture results.

## 2018-11-02 LAB — CULTURE, URINE COMPREHENSIVE

## 2018-11-08 DIAGNOSIS — H60503 Unspecified acute noninfective otitis externa, bilateral: Secondary | ICD-10-CM | POA: Insufficient documentation

## 2018-11-13 LAB — CYTOLOGY - PAP
Comment: NEGATIVE
Comment: NEGATIVE
HPV 16: NEGATIVE
HPV 18 / 45: NEGATIVE
High risk HPV: POSITIVE — AB

## 2018-12-19 ENCOUNTER — Encounter: Payer: 59 | Admitting: Obstetrics and Gynecology

## 2019-01-29 DIAGNOSIS — M359 Systemic involvement of connective tissue, unspecified: Secondary | ICD-10-CM | POA: Diagnosis not present

## 2019-01-29 DIAGNOSIS — Z79899 Other long term (current) drug therapy: Secondary | ICD-10-CM | POA: Diagnosis not present

## 2019-01-30 ENCOUNTER — Telehealth: Payer: Self-pay

## 2019-01-30 NOTE — Telephone Encounter (Signed)
MOM FOR PATIENT TO CONFIRM AND SCREEN FOR 02-01-19 OV.

## 2019-02-01 ENCOUNTER — Ambulatory Visit: Payer: 59 | Admitting: Nurse Practitioner

## 2019-02-01 ENCOUNTER — Other Ambulatory Visit: Payer: Self-pay

## 2019-02-01 ENCOUNTER — Encounter: Payer: Self-pay | Admitting: Nurse Practitioner

## 2019-02-01 DIAGNOSIS — E559 Vitamin D deficiency, unspecified: Secondary | ICD-10-CM | POA: Diagnosis not present

## 2019-02-01 DIAGNOSIS — E538 Deficiency of other specified B group vitamins: Secondary | ICD-10-CM

## 2019-02-01 DIAGNOSIS — R41840 Attention and concentration deficit: Secondary | ICD-10-CM | POA: Diagnosis not present

## 2019-02-01 MED ORDER — LISDEXAMFETAMINE DIMESYLATE 70 MG PO CAPS: 70.0000 mg | ORAL_CAPSULE | Freq: Every day | ORAL | 0 refills | Status: DC

## 2019-02-01 MED ORDER — CYANOCOBALAMIN 1000 MCG/ML IJ SOLN: 1000.0000 ug | INTRAMUSCULAR | 5 refills | Status: DC

## 2019-02-01 MED ORDER — VITAMIN D (ERGOCALCIFEROL) 1.25 MG (50000 UNIT) PO CAPS: 50000.0000 [IU] | ORAL_CAPSULE | ORAL | 5 refills | Status: DC

## 2019-02-01 MED ORDER — "INSULIN SYRINGE-NEEDLE U-100 32G X 5/16"" 1 ML MISC": 1.0000 [IU] | 5 refills | Status: DC

## 2019-02-01 NOTE — Progress Notes (Signed)
[facility] [place], [place] [address]  Internal MEDICINE  Office Visit Note  Patient Name: Haley Wilkins  [id number]  [id number]  Date of Service: 02/04/2019  Chief Complaint  Patient presents with  . Arthritis  . Medication Refill  . Ear Pain    left ear pain     The patient is here for routine follow up. The patient continues to see rheumatologist. She is currently on prednisone taper. Was told she was in RA flare, however, labs were normal. The patient continues to take plaquenil to keep pain from RA decreased.  She takes takes Vyvanse 70mg  daily as needed. Helps to keep her focused ano on track, especially while at work. No negative side effects She is due to have refills today.        Current Medication: Outpatient Encounter Medications as of 02/01/2019  Medication Sig  . ALPRAZolam (XANAX) 0.25 MG tablet Take 1 tablet (0.25 mg total) by mouth daily as needed for anxiety.  . ciprofloxacin-dexamethasone (CIPRODEX) OTIC suspension Place 4 drops into both ears 2 (two) times daily.  . cyanocobalamin (,VITAMIN B-12,) 1000 MCG/ML injection Inject 1 mL (1,000 mcg total) into the muscle once a week.  . fluconazole (DIFLUCAN) 150 MG tablet Take 1 tablet po once. May repeat dose in 3 days as needed for persistent symptoms.  . hydroxychloroquine (PLAQUENIL) 200 MG tablet Take by mouth.  Marland Kitchen ibuprofen (ADVIL) 600 MG tablet   . levonorgestrel (MIRENA) 20 MCG/24HR IUD 1 each by Intrauterine route once.  . linaclotide (LINZESS) 145 MCG CAPS capsule Take 1 capsule (145 mcg total) by mouth daily before breakfast.  . lisdexamfetamine (VYVANSE) 70 MG capsule Take 1 capsule (70 mg total) by mouth daily.  Marland Kitchen omeprazole (PRILOSEC) 40 MG capsule Take 1 capsule (40 mg total) by mouth 2 (two) times daily.  . predniSONE (DELTASONE) 20 MG tablet Take 20 mg by mouth. Take 20 mg the first week and take 10 mg the second week.  . Vitamin D, Ergocalciferol, (DRISDOL) 1.25 MG (50000  UT) CAPS capsule Take 1 capsule (50,000 Units total) by mouth every 7 (seven) days.  . [DISCONTINUED] cyanocobalamin (,VITAMIN B-12,) 1000 MCG/ML injection Inject 1 mL (1,000 mcg total) into the muscle once a week.  . [DISCONTINUED] lisdexamfetamine (VYVANSE) 70 MG capsule Take 1 capsule (70 mg total) by mouth daily.  . [DISCONTINUED] Vitamin D, Ergocalciferol, (DRISDOL) 50000 units CAPS capsule Take 1 capsule (50,000 Units total) by mouth every 7 (seven) days.  . Insulin Syringe-Needle U-100 32G X 5/16" 1 ML MISC 1 Units by Does not apply route every 30 (thirty) days.  . [DISCONTINUED] nitrofurantoin, macrocrystal-monohydrate, (MACROBID) 100 MG capsule Take 1 capsule (100 mg total) by mouth 2 (two) times daily. (Patient not taking: Reported on 02/01/2019)   No facility-administered encounter medications on file as of 02/01/2019.    Surgical History: Past Surgical History:  Procedure Laterality Date  . CESAREAN SECTION    . LEEP  2006  . LITHOTRIPSY  2003    Medical History: Past Medical History:  Diagnosis Date  . Pelvic pain in female   . Raynaud's disease   . Rheumatoid arthritis ([place])   . Vaginal Pap smear, abnormal   . Vitamin B 12 deficiency     Family History: Family History  Problem Relation Age of Onset  . Diabetes Mother   . Diabetes Father   . Diabetes Maternal Grandfather     Social History   Socioeconomic History  . Marital  status: Married    Spouse name: Not on file  . Number of children: Not on file  . Years of education: Not on file  . Highest education level: Not on file  Occupational History  . Not on file  Tobacco Use  . Smoking status: Never Smoker  . Smokeless tobacco: Never Used  Substance and Sexual Activity  . Alcohol use: Not Currently  . Drug use: No  . Sexual activity: Yes    Birth control/protection: I.U.D.    Comment: mirena  Other Topics Concern  . Not on file  Social History Narrative  . Not on file   Social Determinants of Health    Financial Resource Strain:   . Difficulty of Paying Living Expenses: Not on file  Food Insecurity:   . Worried About Charity fundraiser in the Last Year: Not on file  . Ran Out of Food in the Last Year: Not on file  Transportation Needs:   . Lack of Transportation (Medical): Not on file  . Lack of Transportation (Non-Medical): Not on file  Physical Activity:   . Days of Exercise per Week: Not on file  . Minutes of Exercise per Session: Not on file  Stress:   . Feeling of Stress : Not on file  Social Connections:   . Frequency of Communication with Friends and Family: Not on file  . Frequency of Social Gatherings with Friends and Family: Not on file  . Attends Religious Services: Not on file  . Active Member of Clubs or Organizations: Not on file  . Attends Archivist Meetings: Not on file  . Marital Status: Not on file  Intimate Partner Violence:   . Fear of Current or Ex-Partner: Not on file  . Emotionally Abused: Not on file  . Physically Abused: Not on file  . Sexually Abused: Not on file      Review of Systems  Constitutional: Positive for fatigue. Negative for activity change, chills and unexpected weight change.       Fatigue improved since she saw rheumatologist and was started on plaquenil.   HENT: Negative for congestion, dental problem, postnasal drip, rhinorrhea, sneezing, sore throat and voice change.   Respiratory: Negative for cough, chest tightness, shortness of breath and wheezing.   Cardiovascular: Negative for chest pain and palpitations.  Gastrointestinal: Negative for abdominal pain, constipation, diarrhea, nausea and vomiting.  Endocrine: Negative for cold intolerance, heat intolerance, polydipsia and polyuria.  Musculoskeletal: Positive for arthralgias, joint swelling and myalgias. Negative for back pain and neck pain.       Joint tenderness in many areas. Particularly effecting hips, knees, left ankle, elbows, wrists, and hands.   Skin:  Negative for rash.  Allergic/Immunologic: Positive for environmental allergies.  Neurological: Negative for dizziness, tremors, numbness and headaches.  Hematological: Negative for adenopathy. Does not bruise/bleed easily.  Psychiatric/Behavioral: Positive for decreased concentration. Negative for behavioral problems (Depression), sleep disturbance and suicidal ideas. The patient is nervous/anxious.        Improved focus and anxiety on Vyvanse 70mg  daily.     Today's Vitals   02/01/19 1606  BP: (!) 111/49  Pulse: 86  Resp: 16  SpO2: 100%  Weight: 164 lb 3.2 oz (74.5 kg)  Height: 5\' 9"  (1.753 m)   Body mass index is 24.25 kg/m.  Physical Exam Vitals and nursing note reviewed.  Constitutional:      General: She is not in acute distress.    Appearance: Normal appearance. She is well-developed.  She is not diaphoretic.  HENT:     Head: Normocephalic and atraumatic.     Nose: Nose normal.     Mouth/Throat:     Pharynx: No oropharyngeal exudate.  Eyes:     Conjunctiva/sclera: Conjunctivae normal.     Pupils: Pupils are equal, round, and reactive to light.  Neck:     Thyroid: No thyromegaly.     Vascular: No JVD.     Trachea: No tracheal deviation.  Cardiovascular:     Rate and Rhythm: Normal rate and regular rhythm.     Heart sounds: Normal heart sounds. No murmur. No friction rub. No gallop.   Pulmonary:     Effort: Pulmonary effort is normal. No respiratory distress.     Breath sounds: Normal breath sounds. No wheezing or rales.  Chest:     Chest wall: No tenderness.  Abdominal:     General: Bowel sounds are normal.     Palpations: Abdomen is soft.     Tenderness: There is no abdominal tenderness.  Musculoskeletal:        General: Normal range of motion.     Cervical back: Normal range of motion and neck supple.     Comments: Generalized joint pain with point tenderness present.   Lymphadenopathy:     Cervical: No cervical adenopathy.  Skin:    General: Skin is warm  and dry.  Neurological:     General: No focal deficit present.     Mental Status: She is alert and oriented to person, place, and time.     Cranial Nerves: No cranial nerve deficit.  Psychiatric:        Behavior: Behavior normal.        Thought Content: Thought content normal.        Judgment: Judgment normal.    Assessment/Plan: 1. Vitamin D deficiency Drisdol should be taken weekly. - Vitamin D, Ergocalciferol, (DRISDOL) 1.25 MG (50000 UT) CAPS capsule; Take 1 capsule (50,000 Units total) by mouth every 7 (seven) days.  Dispense: 4 capsule; Refill: 5  2. Vitamin B12 deficiency She should be administering B12 injections monthly. May administer .  - cyanocobalamin (,VITAMIN B-12,) 1000 MCG/ML injection; Inject 1 mL (1,000 mcg total) into the muscle once a week.  Dispense: 4 mL; Refill: 5 - Insulin Syringe-Needle U-100 32G X 5/16" 1 ML MISC; 1 Units by Does not apply route every 30 (thirty) days.  Dispense: 1 each; Refill: 5  3. Attention and concentration deficit May continue Vyvanse 70mg  daily. Three 30 day prescriptions were sent to the pharmacy. Dates are 02/01/2019, 03/02/2019, and 03/30/2019.  - lisdexamfetamine (VYVANSE) 70 MG capsule; Take 1 capsule (70 mg total) by mouth daily.  Dispense: 30 capsule; Refill: 0  General Counseling: roya kaschak understanding of the findings of todays visit and agrees with plan of treatment. I have discussed any further diagnostic evaluation that may be needed or ordered today. We also reviewed her medications today. she has been encouraged to call the office with any questions or concerns that should arise related to todays visit.   Refilled Controlled medications today. Reviewed risks and possible side effects associated with taking Stimulants. Combination of these drugs with other psychotropic medications could cause dizziness and drowsiness. Pt needs to Monitor symptoms and exercise caution in driving and operating heavy machinery to avoid  damages to oneself, to others and to the surroundings. Patient verbalized understanding in this matter. Dependence and abuse for these drugs will be monitored closely. A Controlled substance  policy and procedure is on file which allows [facility] to order a urine drug screen test at any visit. Patient understands and agrees with the plan..  This patient was seen by Leretha Pol FNP Collaboration with Dr Lavera Guise as a part of collaborative care agreement  Meds ordered this encounter  Medications  . Vitamin D, Ergocalciferol, (DRISDOL) 1.25 MG (50000 UT) CAPS capsule    Sig: Take 1 capsule (50,000 Units total) by mouth every 7 (seven) days.    Dispense:  4 capsule    Refill:  5    Order Specific Question:   Supervising Provider    Answer:   Lavera Guise [id number]  . lisdexamfetamine (VYVANSE) 70 MG capsule    Sig: Take 1 capsule (70 mg total) by mouth daily.    Dispense:  30 capsule    Refill:  0    Order Specific Question:   Supervising Provider    Answer:   Lavera Guise [id number]  . cyanocobalamin (,VITAMIN B-12,) 1000 MCG/ML injection    Sig: Inject 1 mL (1,000 mcg total) into the muscle once a week.    Dispense:  4 mL    Refill:  5    Order Specific Question:   Supervising Provider    Answer:   Lavera Guise [id number]  . Insulin Syringe-Needle U-100 32G X 5/16" 1 ML MISC    Sig: 1 Units by Does not apply route every 30 (thirty) days.    Dispense:  1 each    Refill:  5    To use for monthly [place] b12 injections    Order Specific Question:   Supervising Provider    Answer:   Lavera Guise [id number]    Total Time spent: 51 Minutes      Dr Lavera Guise Internal medicine

## 2019-02-15 DIAGNOSIS — H524 Presbyopia: Secondary | ICD-10-CM | POA: Diagnosis not present

## 2019-02-15 DIAGNOSIS — Z79899 Other long term (current) drug therapy: Secondary | ICD-10-CM | POA: Diagnosis not present

## 2019-02-15 DIAGNOSIS — H16223 Keratoconjunctivitis sicca, not specified as Sjogren's, bilateral: Secondary | ICD-10-CM | POA: Diagnosis not present

## 2019-02-15 DIAGNOSIS — H5203 Hypermetropia, bilateral: Secondary | ICD-10-CM | POA: Diagnosis not present

## 2019-02-15 DIAGNOSIS — H52223 Regular astigmatism, bilateral: Secondary | ICD-10-CM | POA: Diagnosis not present

## 2019-02-15 DIAGNOSIS — H40011 Open angle with borderline findings, low risk, right eye: Secondary | ICD-10-CM | POA: Diagnosis not present

## 2019-02-27 ENCOUNTER — Encounter: Payer: Self-pay | Admitting: Nurse Practitioner

## 2019-02-28 ENCOUNTER — Other Ambulatory Visit: Payer: Self-pay | Admitting: Nurse Practitioner

## 2019-02-28 DIAGNOSIS — R41840 Attention and concentration deficit: Secondary | ICD-10-CM

## 2019-02-28 MED ORDER — LISDEXAMFETAMINE DIMESYLATE 70 MG PO CAPS: 70.0000 mg | ORAL_CAPSULE | Freq: Every day | ORAL | 0 refills | Status: DC

## 2019-02-28 NOTE — Progress Notes (Signed)
Sent two post dated prescriptions for vyvanse 70mg  to [facility]. Dates are 03/26/2019 and 04/23/2009

## 2019-03-22 DIAGNOSIS — H52223 Regular astigmatism, bilateral: Secondary | ICD-10-CM | POA: Diagnosis not present

## 2019-03-22 DIAGNOSIS — H40011 Open angle with borderline findings, low risk, right eye: Secondary | ICD-10-CM | POA: Diagnosis not present

## 2019-03-22 DIAGNOSIS — Z79899 Other long term (current) drug therapy: Secondary | ICD-10-CM | POA: Diagnosis not present

## 2019-03-22 DIAGNOSIS — H5203 Hypermetropia, bilateral: Secondary | ICD-10-CM | POA: Diagnosis not present

## 2019-03-22 DIAGNOSIS — H16223 Keratoconjunctivitis sicca, not specified as Sjogren's, bilateral: Secondary | ICD-10-CM | POA: Diagnosis not present

## 2019-03-22 DIAGNOSIS — H524 Presbyopia: Secondary | ICD-10-CM | POA: Diagnosis not present

## 2019-04-08 ENCOUNTER — Other Ambulatory Visit: Payer: Self-pay | Admitting: Internal Medicine

## 2019-04-08 ENCOUNTER — Encounter: Payer: Self-pay | Admitting: Nurse Practitioner

## 2019-04-08 DIAGNOSIS — K219 Gastro-esophageal reflux disease without esophagitis: Secondary | ICD-10-CM

## 2019-04-08 MED ORDER — OMEPRAZOLE 40 MG PO CPDR: 40.0000 mg | DELAYED_RELEASE_CAPSULE | Freq: Two times a day (BID) | ORAL | 1 refills | Status: DC

## 2019-04-25 DIAGNOSIS — Z79899 Other long term (current) drug therapy: Secondary | ICD-10-CM | POA: Diagnosis not present

## 2019-04-25 DIAGNOSIS — M359 Systemic involvement of connective tissue, unspecified: Secondary | ICD-10-CM | POA: Diagnosis not present

## 2019-05-17 ENCOUNTER — Telehealth: Payer: Self-pay

## 2019-05-17 NOTE — Telephone Encounter (Signed)
[facility]mom to confirm and screen for 05-21-19 ov.

## 2019-05-21 ENCOUNTER — Encounter: Payer: Self-pay | Admitting: Nurse Practitioner

## 2019-05-21 ENCOUNTER — Other Ambulatory Visit: Payer: Self-pay

## 2019-05-21 ENCOUNTER — Other Ambulatory Visit: Payer: Self-pay | Admitting: Nurse Practitioner

## 2019-05-21 ENCOUNTER — Ambulatory Visit: Payer: 59 | Admitting: Nurse Practitioner

## 2019-05-21 VITALS — BP 111/67 | HR 79 | Temp 98.0°F | Resp 16 | Ht 69.0 in | Wt 160.6 lb

## 2019-05-21 DIAGNOSIS — M0579 Rheumatoid arthritis with rheumatoid factor of multiple sites without organ or systems involvement: Secondary | ICD-10-CM

## 2019-05-21 DIAGNOSIS — K219 Gastro-esophageal reflux disease without esophagitis: Secondary | ICD-10-CM | POA: Diagnosis not present

## 2019-05-21 DIAGNOSIS — R41840 Attention and concentration deficit: Secondary | ICD-10-CM | POA: Diagnosis not present

## 2019-05-21 DIAGNOSIS — Z79899 Other long term (current) drug therapy: Secondary | ICD-10-CM

## 2019-05-21 LAB — POCT URINE DRUG SCREEN
POC Amphetamine UR: POSITIVE — AB
POC BENZODIAZEPINES UR: NOT DETECTED
POC Barbiturate UR: NOT DETECTED
POC Cocaine UR: NOT DETECTED
POC Ecstasy UR: NOT DETECTED
POC Marijuana UR: NOT DETECTED
POC Methadone UR: NOT DETECTED
POC Methamphetamine UR: NOT DETECTED
POC Opiate Ur: NOT DETECTED
POC Oxycodone UR: NOT DETECTED
POC PHENCYCLIDINE UR: NOT DETECTED
POC TRICYCLICS UR: NOT DETECTED

## 2019-05-21 MED ORDER — LISDEXAMFETAMINE DIMESYLATE 70 MG PO CAPS: 70.0000 mg | ORAL_CAPSULE | Freq: Every day | ORAL | 0 refills | Status: DC

## 2019-05-21 MED ORDER — OMEPRAZOLE 40 MG PO CPDR: 40.0000 mg | DELAYED_RELEASE_CAPSULE | Freq: Two times a day (BID) | ORAL | 3 refills | Status: DC

## 2019-05-21 NOTE — Progress Notes (Signed)
[facility] [place], [place] [address]  Internal MEDICINE  Office Visit Note  Patient Name: Haley Wilkins  [id number]  [id number]  Date of Service: 06/05/2019  Chief Complaint  Patient presents with  . Gastroesophageal Reflux  . Arthritis    The patient is here for routine follow up. The patient continues to see rheumatologist. She is currently on prednisone taper. Was told she was in RA flare, however, labs were normal. The patient continues to take plaquenil to keep pain from RA decreased.  She takes takes Vyvanse 70mg  daily as needed. Helps to keep her focused ano on track, especially while at work. No negative side effects She is due to have refills today.        Current Medication: Outpatient Encounter Medications as of 05/21/2019  Medication Sig  . ALPRAZolam (XANAX) 0.25 MG tablet Take 1 tablet (0.25 mg total) by mouth daily as needed for anxiety.  . cyanocobalamin (,VITAMIN B-12,) 1000 MCG/ML injection Inject 1 mL (1,000 mcg total) into the muscle once a week.  Marland Kitchen ibuprofen (ADVIL) 600 MG tablet   . Insulin Syringe-Needle U-100 32G X 5/16" 1 ML MISC 1 Units by Does not apply route every 30 (thirty) days.  Marland Kitchen levonorgestrel (MIRENA) 20 MCG/24HR IUD 1 each by Intrauterine route once.  . linaclotide (LINZESS) 145 MCG CAPS capsule Take 1 capsule (145 mcg total) by mouth daily before breakfast.  . lisdexamfetamine (VYVANSE) 70 MG capsule Take 1 capsule (70 mg total) by mouth daily.  Marland Kitchen omeprazole (PRILOSEC) 40 MG capsule Take 1 capsule (40 mg total) by mouth 2 (two) times daily.  . Vitamin D, Ergocalciferol, (DRISDOL) 1.25 MG (50000 UT) CAPS capsule Take 1 capsule (50,000 Units total) by mouth every 7 (seven) days.  . [DISCONTINUED] lisdexamfetamine (VYVANSE) 70 MG capsule Take 1 capsule (70 mg total) by mouth daily.  . [DISCONTINUED] lisdexamfetamine (VYVANSE) 70 MG capsule Take 1 capsule (70 mg total) by mouth daily.  . [DISCONTINUED] lisdexamfetamine  (VYVANSE) 70 MG capsule Take 1 capsule (70 mg total) by mouth daily.  . [DISCONTINUED] omeprazole (PRILOSEC) 40 MG capsule Take 1 capsule (40 mg total) by mouth 2 (two) times daily.  . [DISCONTINUED] ciprofloxacin-dexamethasone (CIPRODEX) OTIC suspension Place 4 drops into both ears 2 (two) times daily. (Patient not taking: Reported on 05/21/2019)  . [DISCONTINUED] fluconazole (DIFLUCAN) 150 MG tablet Take 1 tablet po once. May repeat dose in 3 days as needed for persistent symptoms. (Patient not taking: Reported on 05/21/2019)  . [DISCONTINUED] predniSONE (DELTASONE) 20 MG tablet Take 20 mg by mouth. Take 20 mg the first week and take 10 mg the second week.   No facility-administered encounter medications on file as of 05/21/2019.    Surgical History: Past Surgical History:  Procedure Laterality Date  . CESAREAN SECTION    . LEEP  2006  . LITHOTRIPSY  2003    Medical History: Past Medical History:  Diagnosis Date  . Pelvic pain in female   . Raynaud's disease   . Rheumatoid arthritis ([place])   . Vaginal Pap smear, abnormal   . Vitamin B 12 deficiency     Family History: Family History  Problem Relation Age of Onset  . Diabetes Mother   . Diabetes Father   . Diabetes Maternal Grandfather     Social History   Socioeconomic History  . Marital status: Married    Spouse name: Not on file  . Number of children: Not on file  . Years of education:  Not on file  . Highest education level: Not on file  Occupational History  . Not on file  Tobacco Use  . Smoking status: Never Smoker  . Smokeless tobacco: Never Used  Substance and Sexual Activity  . Alcohol use: Not Currently  . Drug use: No  . Sexual activity: Yes    Birth control/protection: I.U.D.    Comment: mirena  Other Topics Concern  . Not on file  Social History Narrative  . Not on file   Social Determinants of Health   Financial Resource Strain:   . Difficulty of Paying Living Expenses:   Food Insecurity:   .  Worried About Charity fundraiser in the Last Year:   . Arboriculturist in the Last Year:   Transportation Needs:   . Film/video editor (Medical):   Marland Kitchen Lack of Transportation (Non-Medical):   Physical Activity:   . Days of Exercise per Week:   . Minutes of Exercise per Session:   Stress:   . Feeling of Stress :   Social Connections:   . Frequency of Communication with Friends and Family:   . Frequency of Social Gatherings with Friends and Family:   . Attends Religious Services:   . Active Member of Clubs or Organizations:   . Attends Archivist Meetings:   Marland Kitchen Marital Status:   Intimate Partner Violence:   . Fear of Current or Ex-Partner:   . Emotionally Abused:   Marland Kitchen Physically Abused:   . Sexually Abused:       Review of Systems  Constitutional: Positive for fatigue. Negative for activity change, chills and unexpected weight change.       Fatigue improved since she saw rheumatologist and was started on plaquenil.   HENT: Negative for congestion, dental problem, postnasal drip, rhinorrhea, sneezing, sore throat and voice change.   Respiratory: Negative for cough, chest tightness, shortness of breath and wheezing.   Cardiovascular: Negative for chest pain and palpitations.  Gastrointestinal: Negative for abdominal pain, constipation, diarrhea, nausea and vomiting.  Endocrine: Negative for cold intolerance, heat intolerance, polydipsia and polyuria.  Musculoskeletal: Positive for arthralgias, joint swelling and myalgias. Negative for back pain and neck pain.       Joint tenderness in many areas. Particularly effecting hips, knees, left ankle, elbows, wrists, and hands.   Skin: Negative for rash.  Allergic/Immunologic: Positive for environmental allergies.  Neurological: Negative for dizziness, tremors, numbness and headaches.  Hematological: Negative for adenopathy. Does not bruise/bleed easily.  Psychiatric/Behavioral: Positive for decreased concentration. Negative  for behavioral problems (Depression), sleep disturbance and suicidal ideas. The patient is nervous/anxious.        Improved focus and anxiety on Vyvanse 70mg  daily.     Today's Vitals   05/21/19 1549  BP: 111/67  Pulse: 79  Resp: 16  Temp: 98 F (36.7 C)  SpO2: 98%  Weight: 160 lb 9.6 oz (72.8 kg)  Height: 5\' 9"  (1.753 m)   Body mass index is 23.72 kg/m.  Physical Exam Vitals and nursing note reviewed.  Constitutional:      General: She is not in acute distress.    Appearance: Normal appearance. She is well-developed. She is not diaphoretic.  HENT:     Head: Normocephalic and atraumatic.     Nose: Nose normal.     Mouth/Throat:     Pharynx: No oropharyngeal exudate.  Eyes:     Conjunctiva/sclera: Conjunctivae normal.     Pupils: Pupils are equal, round, and reactive  to light.  Neck:     Thyroid: No thyromegaly.     Vascular: No JVD.     Trachea: No tracheal deviation.  Cardiovascular:     Rate and Rhythm: Normal rate and regular rhythm.     Heart sounds: Normal heart sounds. No murmur. No friction rub. No gallop.   Pulmonary:     Effort: Pulmonary effort is normal. No respiratory distress.     Breath sounds: Normal breath sounds. No wheezing or rales.  Chest:     Chest wall: No tenderness.  Abdominal:     General: Bowel sounds are normal.     Palpations: Abdomen is soft.     Tenderness: There is no abdominal tenderness.  Musculoskeletal:        General: Normal range of motion.     Cervical back: Normal range of motion and neck supple.     Comments: Generalized joint pain with point tenderness present.   Lymphadenopathy:     Cervical: No cervical adenopathy.  Skin:    General: Skin is warm and dry.  Neurological:     General: No focal deficit present.     Mental Status: She is alert and oriented to person, place, and time.     Cranial Nerves: No cranial nerve deficit.  Psychiatric:        Behavior: Behavior normal.        Thought Content: Thought content  normal.        Judgment: Judgment normal.   Assessment/Plan: 1. Gastroesophageal reflux disease without esophagitis May continue omeprazole 40mg  twice daily due to GERD. - omeprazole (PRILOSEC) 40 MG capsule; Take 1 capsule (40 mg total) by mouth 2 (two) times daily.  Dispense: 60 capsule; Refill: 3  2. Attention and concentration deficit OK to conitnue vyvanse 70mg  daily. Three 30 day prescriptions were sent to her pharmacy. Dates are 05/21/2019, 06/18/2019, and 07/17/2019.  - lisdexamfetamine (VYVANSE) 70 MG capsule; Take 1 capsule (70 mg total) by mouth daily.  Dispense: 30 capsule; Refill: 0  3. Rheumatoid arthritis involving multiple sites with positive rheumatoid factor ([place]) Continue regular visits with rheumatologist as scheduled.   4. Encounter for long-term (current) use of medications - POCT Urine Drug Screen appropriately positive for AMP only   General Counseling: kailina kalafut understanding of the findings of todays visit and agrees with plan of treatment. I have discussed any further diagnostic evaluation that may be needed or ordered today. We also reviewed her medications today. she has been encouraged to call the office with any questions or concerns that should arise related to todays visit.  Refilled Controlled medications today. Reviewed risks and possible side effects associated with taking Stimulants. Combination of these drugs with other psychotropic medications could cause dizziness and drowsiness. Pt needs to Monitor symptoms and exercise caution in driving and operating heavy machinery to avoid damages to oneself, to others and to the surroundings. Patient verbalized understanding in this matter. Dependence and abuse for these drugs will be monitored closely. A Controlled substance policy and procedure is on file which allows [facility] to order a urine drug screen test at any visit. Patient understands and agrees with the plan..  This patient was seen by  Leretha Pol FNP Collaboration with Dr Lavera Guise as a part of collaborative care agreement  Orders Placed This Encounter  Procedures  . POCT Urine Drug Screen    Meds ordered this encounter  Medications  . omeprazole (PRILOSEC) 40 MG capsule  Sig: Take 1 capsule (40 mg total) by mouth 2 (two) times daily.    Dispense:  60 capsule    Refill:  3    Order Specific Question:   Supervising Provider    Answer:   Lavera Guise [id number]  . DISCONTD: lisdexamfetamine (VYVANSE) 70 MG capsule    Sig: Take 1 capsule (70 mg total) by mouth daily.    Dispense:  30 capsule    Refill:  0    Order Specific Question:   Supervising Provider    Answer:   Lavera Guise [id number]  . DISCONTD: lisdexamfetamine (VYVANSE) 70 MG capsule    Sig: Take 1 capsule (70 mg total) by mouth daily.    Dispense:  30 capsule    Refill:  0    Fill after 06/18/2019    Order Specific Question:   Supervising Provider    Answer:   Lavera Guise [id number]  . lisdexamfetamine (VYVANSE) 70 MG capsule    Sig: Take 1 capsule (70 mg total) by mouth daily.    Dispense:  30 capsule    Refill:  0    Fill after 07/17/2019    Order Specific Question:   Supervising Provider    Answer:   Lavera Guise [id number]    Total time spent: 30 Minutes   Time spent includes review of chart, medications, test results, and follow up plan with the patient.      Dr Lavera Guise Internal medicine

## 2019-06-05 DIAGNOSIS — K219 Gastro-esophageal reflux disease without esophagitis: Secondary | ICD-10-CM | POA: Insufficient documentation

## 2019-06-29 ENCOUNTER — Encounter: Payer: Self-pay | Admitting: Nurse Practitioner

## 2019-06-29 ENCOUNTER — Other Ambulatory Visit: Payer: Self-pay | Admitting: Nurse Practitioner

## 2019-06-29 DIAGNOSIS — N390 Urinary tract infection, site not specified: Secondary | ICD-10-CM

## 2019-06-29 DIAGNOSIS — R300 Dysuria: Secondary | ICD-10-CM

## 2019-06-29 MED ORDER — NITROFURANTOIN MONOHYD MACRO 100 MG PO CAPS: 100.0000 mg | ORAL_CAPSULE | Freq: Two times a day (BID) | ORAL | 0 refills | Status: DC

## 2019-06-29 MED ORDER — PHENAZOPYRIDINE HCL 200 MG PO TABS: 200.0000 mg | ORAL_TABLET | Freq: Three times a day (TID) | ORAL | 0 refills | Status: DC | PRN

## 2019-06-29 NOTE — Progress Notes (Signed)
Sent prescriptions for macribid 100mg  twice dail yfor next 7 days. Added pyridium 200ng. This may be taken up to three times daily as needed for bladder pain and spasms. Both sent to [facility] employee pharmacy.

## 2019-08-07 ENCOUNTER — Other Ambulatory Visit: Payer: Self-pay | Admitting: Nurse Practitioner

## 2019-08-07 ENCOUNTER — Encounter: Payer: Self-pay | Admitting: Nurse Practitioner

## 2019-08-07 DIAGNOSIS — M0579 Rheumatoid arthritis with rheumatoid factor of multiple sites without organ or systems involvement: Secondary | ICD-10-CM

## 2019-08-07 MED ORDER — IBUPROFEN 600 MG PO TABS: 600.0000 mg | ORAL_TABLET | Freq: Three times a day (TID) | ORAL | 2 refills | Status: DC | PRN

## 2019-08-16 ENCOUNTER — Telehealth: Payer: Self-pay

## 2019-08-16 NOTE — Telephone Encounter (Signed)
Confirmed and screened for 08-20-19 ov.

## 2019-08-20 ENCOUNTER — Other Ambulatory Visit: Payer: Self-pay

## 2019-08-20 ENCOUNTER — Encounter: Payer: Self-pay | Admitting: Nurse Practitioner

## 2019-08-20 ENCOUNTER — Ambulatory Visit: Payer: 59 | Admitting: Nurse Practitioner

## 2019-08-20 VITALS — BP 109/59 | HR 71 | Temp 98.2°F | Resp 16 | Ht 69.0 in | Wt 165.0 lb

## 2019-08-20 DIAGNOSIS — R41840 Attention and concentration deficit: Secondary | ICD-10-CM | POA: Diagnosis not present

## 2019-08-20 DIAGNOSIS — M0579 Rheumatoid arthritis with rheumatoid factor of multiple sites without organ or systems involvement: Secondary | ICD-10-CM | POA: Diagnosis not present

## 2019-08-20 DIAGNOSIS — E538 Deficiency of other specified B group vitamins: Secondary | ICD-10-CM | POA: Diagnosis not present

## 2019-08-20 MED ORDER — LISDEXAMFETAMINE DIMESYLATE 70 MG PO CAPS: 70.0000 mg | ORAL_CAPSULE | Freq: Every day | ORAL | 0 refills | Status: DC

## 2019-08-20 NOTE — Progress Notes (Signed)
[facility],  [address]  Internal MEDICINE  Office Visit Note  Patient Name: Haley Wilkins  [id number]  [id number]  Date of Service: 09/15/2019  Chief Complaint  Patient presents with  . Follow-up  . Quality Metric Gaps    TDAP    The patient is here for routine follow up.  The patient continues to see rheumatologist. The patient continues to take plaquenil to keep pain from RA decreased. The patient states that she is very concerned about taking COVID 19 vaccine. She currently works for [employer]. They are going to start requiring all employees to get COVID vaccines or they will lose their jobs. Due to frequent RA flares and immunosuppressed, she does not wish to take the vaccine.  She takes takes Vyvanse 70mg  daily as needed. Helps to keep her focused ano on track, especially while at work. No negative side effects She is due to have refills today.         Current Medication: Outpatient Encounter Medications as of 08/20/2019  Medication Sig  . ALPRAZolam (XANAX) 0.25 MG tablet Take 1 tablet (0.25 mg total) by mouth daily as needed for anxiety.  . cyanocobalamin (,VITAMIN B-12,) 1000 MCG/ML injection Inject 1 mL (1,000 mcg total) into the muscle once a week.  Marland Kitchen ibuprofen (ADVIL) 600 MG tablet Take 1 tablet (600 mg total) by mouth every 8 (eight) hours as needed.  . Insulin Syringe-Needle U-100 32G X 5/16" 1 ML MISC 1 Units by Does not apply route every 30 (thirty) days.  Marland Kitchen levonorgestrel (MIRENA) 20 MCG/24HR IUD 1 each by Intrauterine route once.  . linaclotide (LINZESS) 145 MCG CAPS capsule Take 1 capsule (145 mcg total) by mouth daily before breakfast.  . lisdexamfetamine (VYVANSE) 70 MG capsule Take 1 capsule (70 mg total) by mouth daily.  . nitrofurantoin, macrocrystal-monohydrate, (MACROBID) 100 MG capsule Take 1 capsule (100 mg total) by mouth 2 (two) times daily.  Marland Kitchen omeprazole (PRILOSEC) 40 MG capsule Take 1 capsule (40 mg  total) by mouth 2 (two) times daily.  . phenazopyridine (PYRIDIUM) 200 MG tablet Take 1 tablet (200 mg total) by mouth 3 (three) times daily as needed for pain.  . Vitamin D, Ergocalciferol, (DRISDOL) 1.25 MG (50000 UT) CAPS capsule Take 1 capsule (50,000 Units total) by mouth every 7 (seven) days.  . [DISCONTINUED] lisdexamfetamine (VYVANSE) 70 MG capsule Take 1 capsule (70 mg total) by mouth daily.  . [DISCONTINUED] lisdexamfetamine (VYVANSE) 70 MG capsule Take 1 capsule (70 mg total) by mouth daily.  . [DISCONTINUED] lisdexamfetamine (VYVANSE) 70 MG capsule Take 1 capsule (70 mg total) by mouth daily.   No facility-administered encounter medications on file as of 08/20/2019.    Surgical History: Past Surgical History:  Procedure Laterality Date  . CESAREAN SECTION    . LEEP  2006  . LITHOTRIPSY  2003    Medical History: Past Medical History:  Diagnosis Date  . Pelvic pain in female   . Raynaud's disease   . Rheumatoid arthritis ([place])   . Vaginal Pap smear, abnormal   . Vitamin B 12 deficiency     Family History: Family History  Problem Relation Age of Onset  . Diabetes Mother   . Diabetes Father   . Diabetes Maternal Grandfather     Social History   Socioeconomic History  . Marital status: Married    Spouse name: Not on file  . Number of children: Not on file  . Years of education:  Not on file  . Highest education level: Not on file  Occupational History  . Not on file  Tobacco Use  . Smoking status: Never Smoker  . Smokeless tobacco: Never Used  Vaping Use  . Vaping Use: Never used  Substance and Sexual Activity  . Alcohol use: Not Currently  . Drug use: No  . Sexual activity: Yes    Birth control/protection: I.U.D.    Comment: mirena  Other Topics Concern  . Not on file  Social History Narrative  . Not on file   Social Determinants of Health   Financial Resource Strain:   . Difficulty of Paying Living Expenses: Not on file  Food Insecurity:   .  Worried About Charity fundraiser in the Last Year: Not on file  . Ran Out of Food in the Last Year: Not on file  Transportation Needs:   . Lack of Transportation (Medical): Not on file  . Lack of Transportation (Non-Medical): Not on file  Physical Activity:   . Days of Exercise per Week: Not on file  . Minutes of Exercise per Session: Not on file  Stress:   . Feeling of Stress : Not on file  Social Connections:   . Frequency of Communication with Friends and Family: Not on file  . Frequency of Social Gatherings with Friends and Family: Not on file  . Attends Religious Services: Not on file  . Active Member of Clubs or Organizations: Not on file  . Attends Archivist Meetings: Not on file  . Marital Status: Not on file  Intimate Partner Violence:   . Fear of Current or Ex-Partner: Not on file  . Emotionally Abused: Not on file  . Physically Abused: Not on file  . Sexually Abused: Not on file      Review of Systems  Constitutional: Positive for fatigue. Negative for activity change, chills and unexpected weight change.  HENT: Negative for congestion, dental problem, postnasal drip, rhinorrhea, sneezing, sore throat and voice change.   Respiratory: Negative for cough, chest tightness, shortness of breath and wheezing.   Cardiovascular: Negative for chest pain and palpitations.  Gastrointestinal: Negative for abdominal pain, constipation, diarrhea, nausea and vomiting.  Endocrine: Negative for cold intolerance, heat intolerance, polydipsia and polyuria.  Musculoskeletal: Positive for arthralgias, joint swelling and myalgias. Negative for back pain and neck pain.       Joint tenderness in many areas. Particularly effecting hips, knees, left ankle, elbows, wrists, and hands.   Skin: Negative for rash.  Allergic/Immunologic: Positive for environmental allergies.  Neurological: Negative for dizziness, tremors, numbness and headaches.  Hematological: Negative for adenopathy.  Does not bruise/bleed easily.  Psychiatric/Behavioral: Positive for decreased concentration. Negative for behavioral problems (Depression), sleep disturbance and suicidal ideas. The patient is nervous/anxious.        Stable with current medication    Today's Vitals   08/20/19 1608  BP: (!) 109/59  Pulse: 71  Resp: 16  Temp: 98.2 F (36.8 C)  SpO2: 100%  Weight: 165 lb (74.8 kg)  Height: 5\' 9"  (1.753 m)   Body mass index is 24.37 kg/m.  Physical Exam Vitals and nursing note reviewed.  Constitutional:      General: She is not in acute distress.    Appearance: Normal appearance. She is well-developed. She is not diaphoretic.  HENT:     Head: Normocephalic and atraumatic.     Nose: Nose normal.     Mouth/Throat:     Pharynx: No oropharyngeal  exudate.  Eyes:     Conjunctiva/sclera: Conjunctivae normal.     Pupils: Pupils are equal, round, and reactive to light.  Neck:     Thyroid: No thyromegaly.     Vascular: No JVD.     Trachea: No tracheal deviation.  Cardiovascular:     Rate and Rhythm: Normal rate and regular rhythm.     Heart sounds: Normal heart sounds. No murmur heard.  No friction rub. No gallop.   Pulmonary:     Effort: Pulmonary effort is normal. No respiratory distress.     Breath sounds: Normal breath sounds. No wheezing or rales.  Chest:     Chest wall: No tenderness.  Abdominal:     Palpations: Abdomen is soft.  Musculoskeletal:        General: Normal range of motion.     Cervical back: Normal range of motion and neck supple.     Comments: Generalized joint pain with point tenderness present.   Lymphadenopathy:     Cervical: No cervical adenopathy.  Skin:    General: Skin is warm and dry.  Neurological:     General: No focal deficit present.     Mental Status: She is alert and oriented to person, place, and time.     Cranial Nerves: No cranial nerve deficit.  Psychiatric:        Behavior: Behavior normal.        Thought Content: Thought content  normal.        Judgment: Judgment normal.    Assessment/Plan: 1. Rheumatoid arthritis involving multiple sites with positive rheumatoid factor ([place]) The patient should continue to see rheumatology as scheduled. Will fill out necessary forms for her to avoid taking COVID 19 vaccine at this time and return them to her when complete.   2. Attention and concentration deficit Continue to take vyvanse 70mg  daily when needed. Three 30 day prescriptions were sent to her pharmacy. Dates are 08/20/2019, 09/18/2019, and 10/17/2019 - lisdexamfetamine (VYVANSE) 70 MG capsule; Take 1 capsule (70 mg total) by mouth daily.  Dispense: 30 capsule; Refill: 0  3. Vitamin B12 deficiency Continue vitamin B12 supplementation at home.   General Counseling: verta riedlinger understanding of the findings of todays visit and agrees with plan of treatment. I have discussed any further diagnostic evaluation that may be needed or ordered today. We also reviewed her medications today. she has been encouraged to call the office with any questions or concerns that should arise related to todays visit.  This patient was seen by [place] with Dr Lavera Guise as a part of collaborative care agreement  Meds ordered this encounter  Medications  . DISCONTD: lisdexamfetamine (VYVANSE) 70 MG capsule    Sig: Take 1 capsule (70 mg total) by mouth daily.    Dispense:  30 capsule    Refill:  0    Order Specific Question:   Supervising Provider    Answer:   Lavera Guise [[id number]]  . DISCONTD: lisdexamfetamine (VYVANSE) 70 MG capsule    Sig: Take 1 capsule (70 mg total) by mouth daily.    Dispense:  30 capsule    Refill:  0    Fill after 09/18/2019    Order Specific Question:   Supervising Provider    Answer:   Lavera Guise [[id number]]  . lisdexamfetamine (VYVANSE) 70 MG capsule    Sig: Take 1 capsule (70 mg total) by mouth daily.    Dispense:  30 capsule  Refill:  0    Fill after 10/17/2019    Order Specific  Question:   Supervising Provider    Answer:   Lavera Guise [[id number]]    Total time spent: 30 Minutes   Time spent includes review of chart, medications, test results, and follow up plan with the patient.      Dr Lavera Guise Internal medicine

## 2019-08-22 ENCOUNTER — Encounter: Payer: Self-pay | Admitting: Nurse Practitioner

## 2019-09-11 NOTE — Telephone Encounter (Signed)
Can you call the patient to find out what we can do with the form I finished for her.

## 2019-09-12 ENCOUNTER — Telehealth: Payer: Self-pay

## 2019-09-12 NOTE — Telephone Encounter (Signed)
Health at work paperwork completed and faxed to [phone] and placed in drawer at front desk for patient to pick up.

## 2019-09-26 IMAGING — US US EXTREM LOW VENOUS*L*
1 series · 14 of 24 positions shown · non-contrast
Comparison: None

CLINICAL DATA: Pain and swelling x1 month

EXAM:
LEFT LOWER EXTREMITY VENOUS DOPPLER ULTRASOUND
TECHNIQUE: Gray-scale sonography with compression, as well as color and duplex
ultrasound, were performed to evaluate the deep venous system from
the level of the common femoral vein through the popliteal and
proximal calf veins.

[Series 1: us extrem low venous*left* · 0.05mm/px · 14 of 34 slices shown]
[im 1/34]
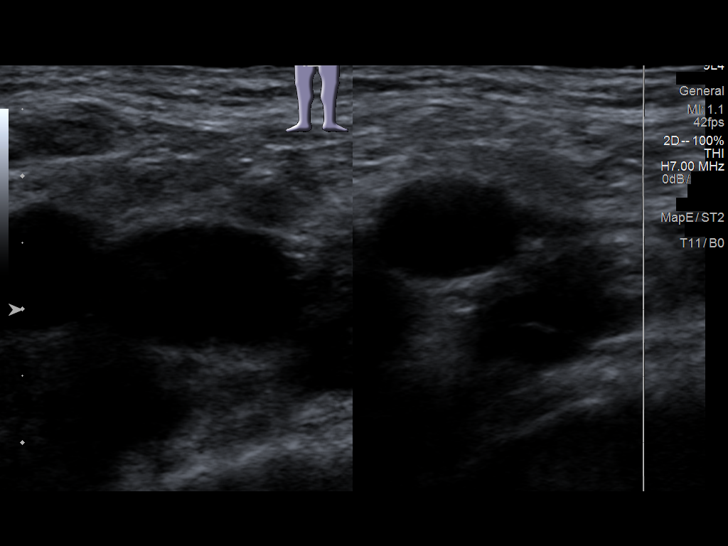
[im 3/34]
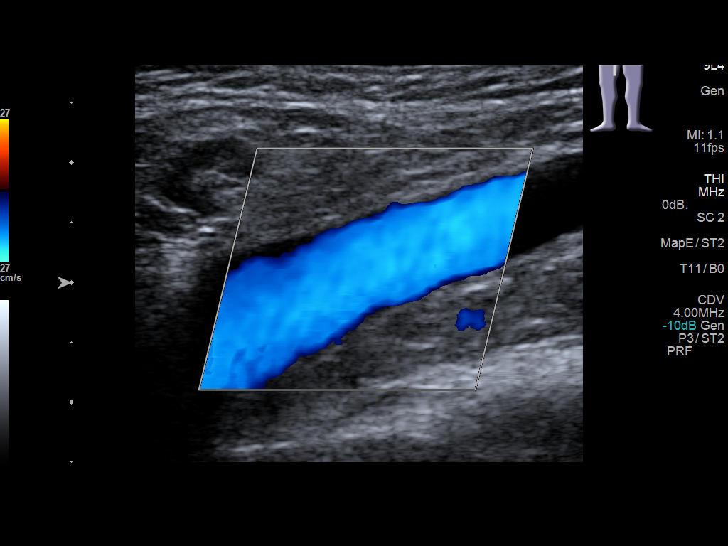
[im 6/34]
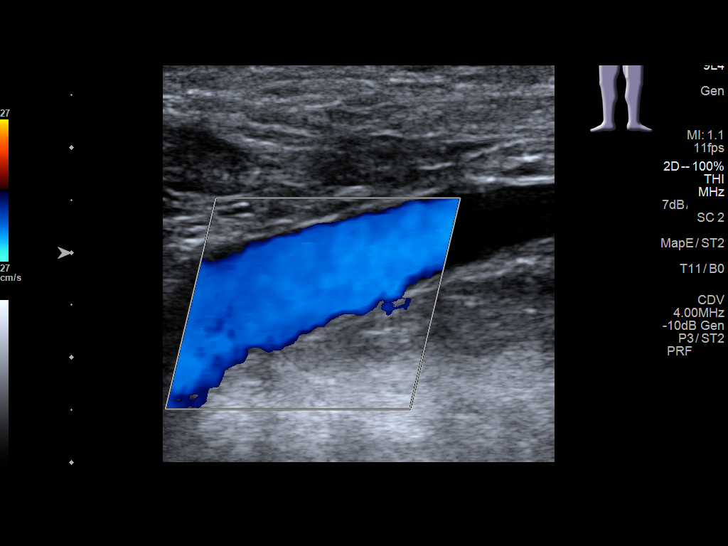
[im 9/34]
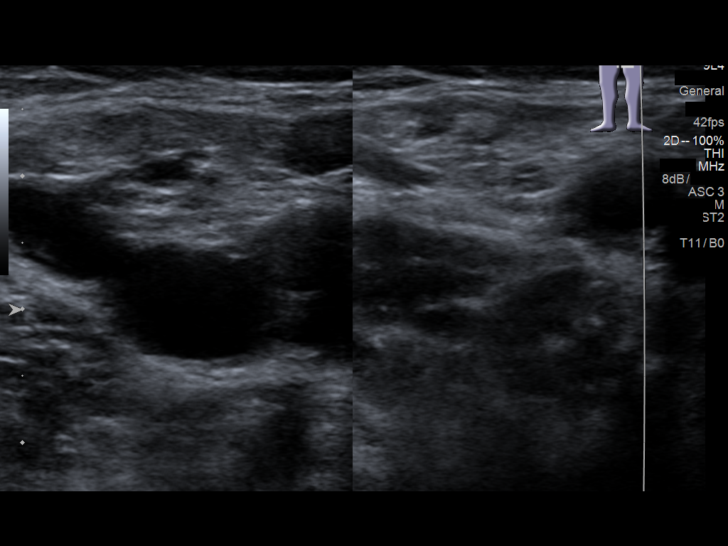
[im 11/34]
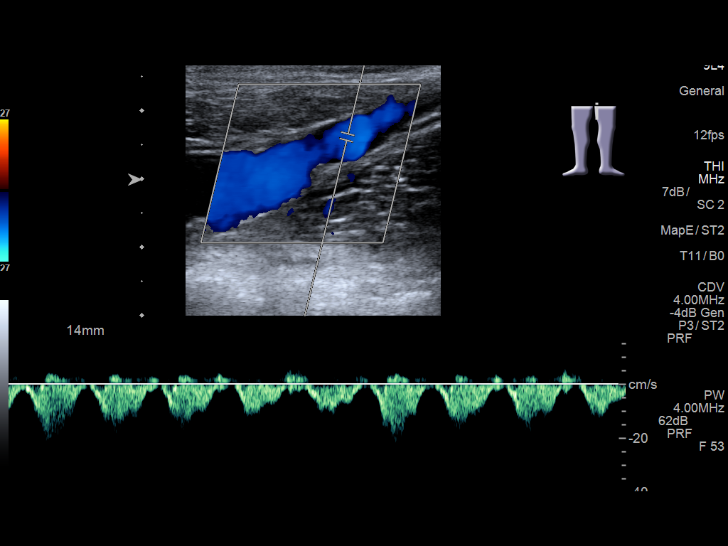
[im 13/34]
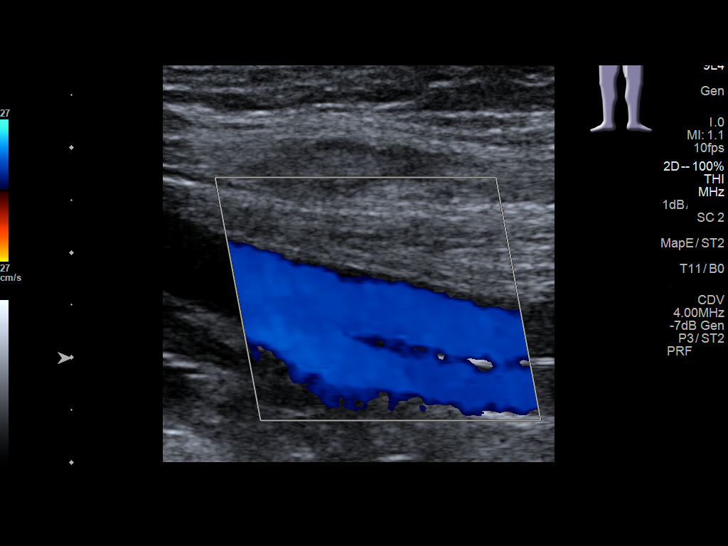
[im 16/34]
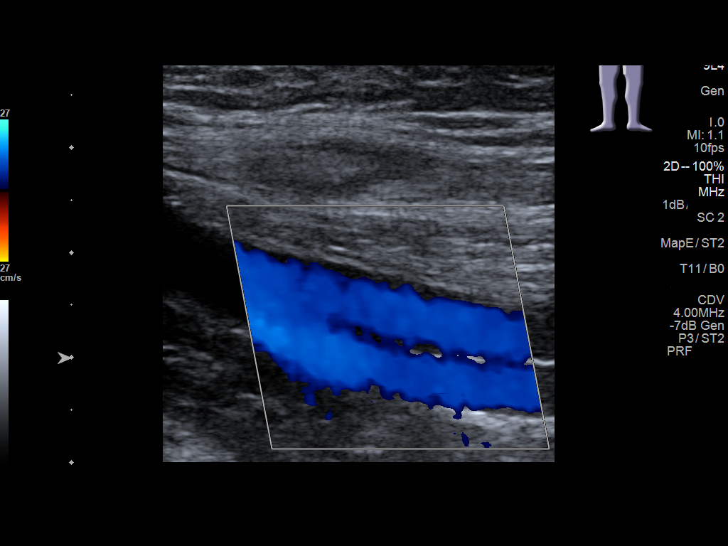
[im 18/34]
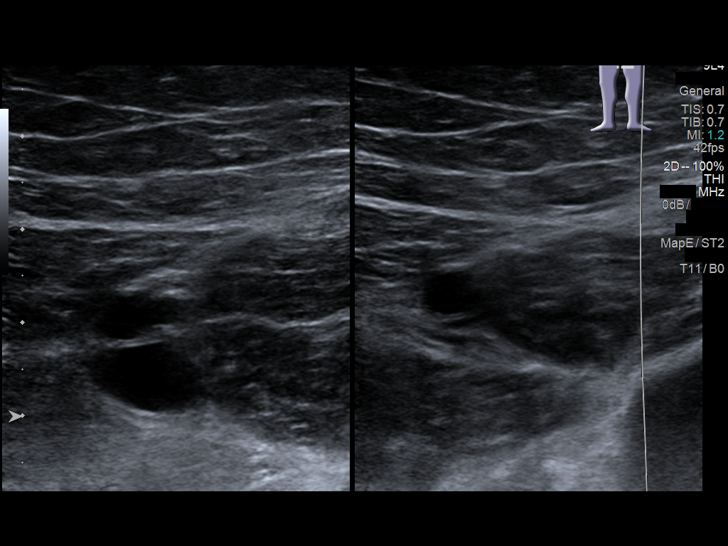
[im 21/34]
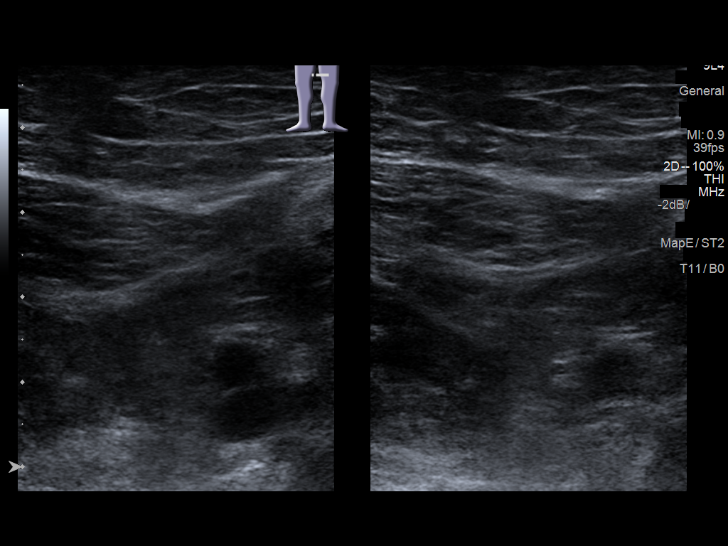
[im 23/34]
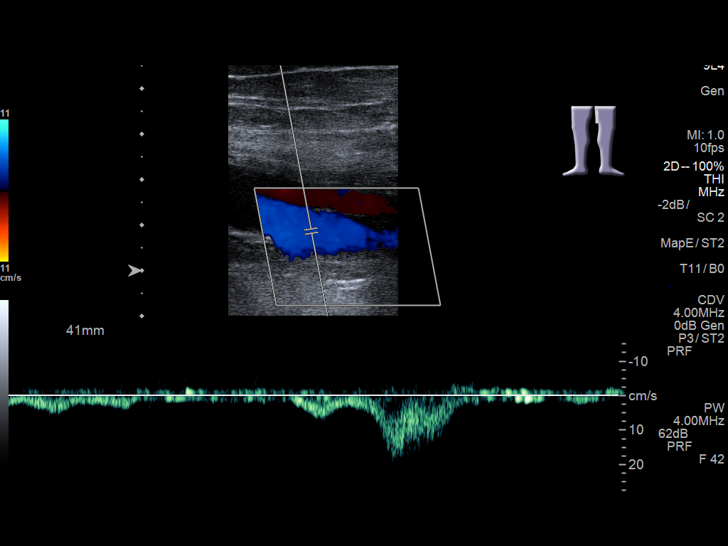
[im 26/34]
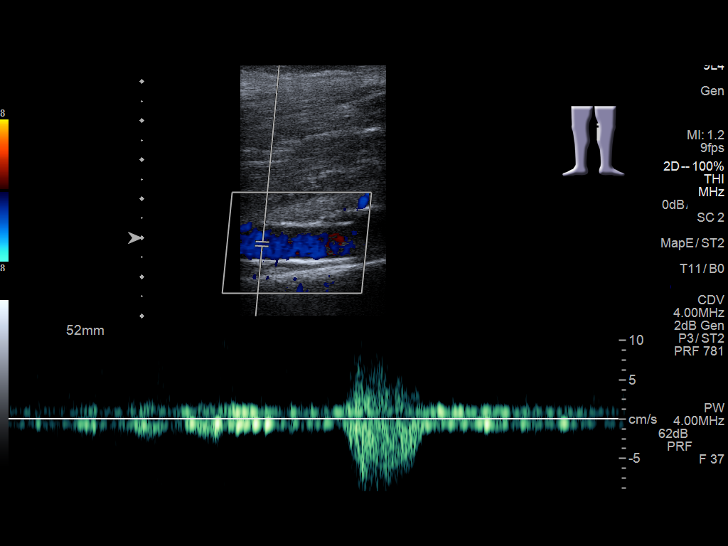
[im 28/34]
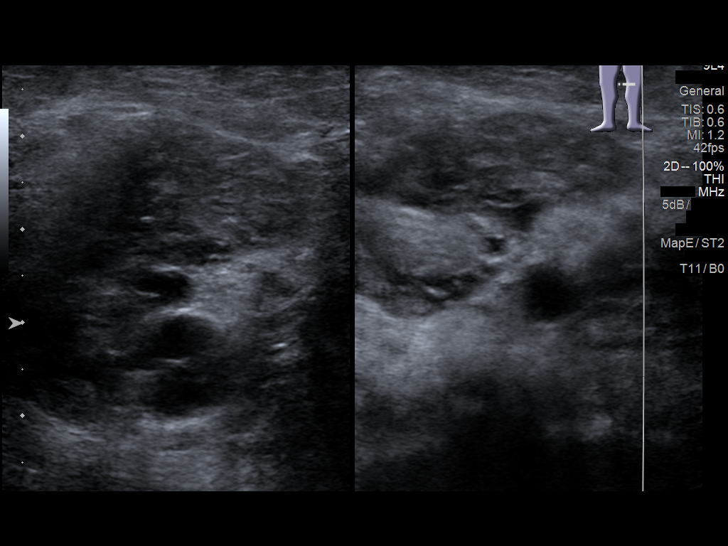
[im 31/34]
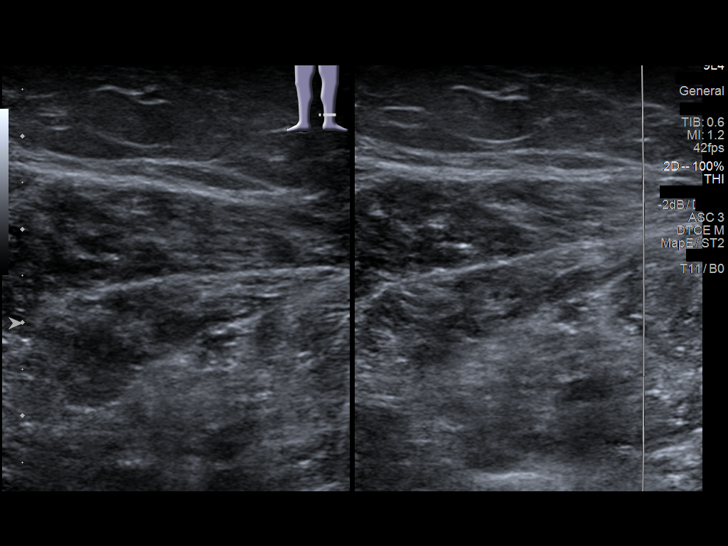
[im 34/34]
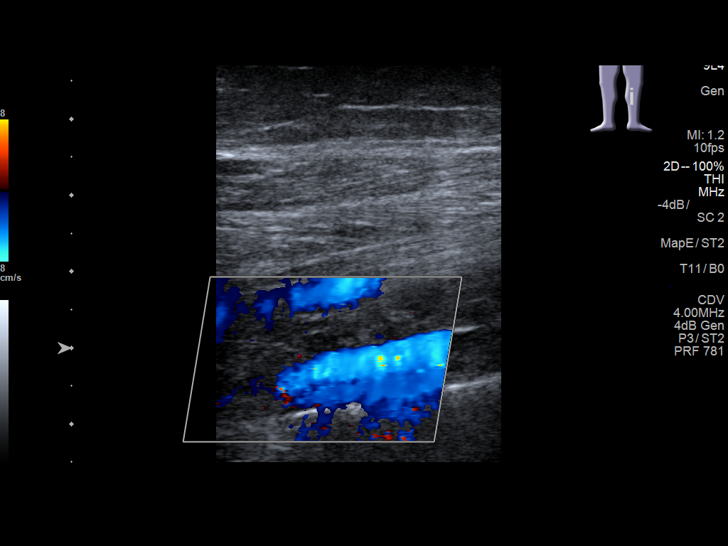

[14 of 24 positions shown; findings below may reference images not displayed]

FINDINGS: Normal compressibility of the common femoral, superficial femoral,
and popliteal veins, as well as the proximal calf veins. No filling
defects to suggest DVT on grayscale or color Doppler imaging.
Doppler waveforms show normal direction of venous flow, normal
respiratory phasicity and response to augmentation. Survey views of
the contralateral common femoral vein are unremarkable.
IMPRESSION: No evidence of  lower extremity deep vein thrombosis, left.

## 2019-11-15 ENCOUNTER — Other Ambulatory Visit: Payer: Self-pay | Admitting: Nurse Practitioner

## 2019-11-15 ENCOUNTER — Encounter: Payer: Self-pay | Admitting: Nurse Practitioner

## 2019-11-15 ENCOUNTER — Ambulatory Visit: Payer: 59 | Admitting: Nurse Practitioner

## 2019-11-15 ENCOUNTER — Other Ambulatory Visit: Payer: Self-pay

## 2019-11-15 VITALS — BP 129/82 | HR 114 | Temp 97.6°F | Resp 16 | Ht 69.0 in | Wt 163.0 lb

## 2019-11-15 DIAGNOSIS — N390 Urinary tract infection, site not specified: Secondary | ICD-10-CM

## 2019-11-15 DIAGNOSIS — R319 Hematuria, unspecified: Secondary | ICD-10-CM | POA: Diagnosis not present

## 2019-11-15 DIAGNOSIS — R1011 Right upper quadrant pain: Secondary | ICD-10-CM

## 2019-11-15 LAB — POCT URINALYSIS DIPSTICK
Bilirubin, UA: NEGATIVE
Glucose, UA: NEGATIVE
Leukocytes, UA: NEGATIVE
Nitrite, UA: NEGATIVE
Protein, UA: POSITIVE — AB
Spec Grav, UA: 1.015 (ref 1.010–1.025)
Urobilinogen, UA: 0.2 U/dL
pH, UA: 6 (ref 5.0–8.0)

## 2019-11-15 MED ORDER — SULFAMETHOXAZOLE-TRIMETHOPRIM 400-80 MG PO TABS: 1.0000 | ORAL_TABLET | Freq: Two times a day (BID) | ORAL | 0 refills | Status: DC

## 2019-11-15 NOTE — Progress Notes (Signed)
[facility] [place], [place] [address]  Internal MEDICINE  Office Visit Note  Patient Name: Haley Wilkins  [id number]  [id number]  Date of Service: 11/15/2019   Pt is here for a sick visit.  Chief Complaint  Patient presents with  . Acute Visit    RUQ pain  . controlled substance form    reviewed with PT     The patient presents for sick visit. She states that on Saturday, she started feeling very "full." felt similar to having significant constipation. Did take a dose of her Linzess. Did have relatively normal response to the linzess. Was able to have bowel movements and felt some better. Did OK all day Sunday. Monday morning, pain started again. Felt like stabbing in the right upper quadrant in the belly. Got so bad, that even walking hurt the abdomen. She did feel like she had a great deal of gas. She did take gas relief tablets and omeprazole. Got little relief. States that today, the pain is some better. Starts in the front of the abdomen and can radiate to right flank. She denies nausea, vomiting, or diarrhea. She denies fever or headache. States that she has not had a bowel movement since Sunday morning.       Current Medication:  Outpatient Encounter Medications as of 11/15/2019  Medication Sig  . ALPRAZolam (XANAX) 0.25 MG tablet Take 1 tablet (0.25 mg total) by mouth daily as needed for anxiety.  . cyanocobalamin (,VITAMIN B-12,) 1000 MCG/ML injection Inject 1 mL (1,000 mcg total) into the muscle once a week.  Marland Kitchen ibuprofen (ADVIL) 600 MG tablet Take 1 tablet (600 mg total) by mouth every 8 (eight) hours as needed.  . Insulin Syringe-Needle U-100 32G X 5/16" 1 ML MISC 1 Units by Does not apply route every 30 (thirty) days.  Marland Kitchen levonorgestrel (MIRENA) 20 MCG/24HR IUD 1 each by Intrauterine route once.  . linaclotide (LINZESS) 145 MCG CAPS capsule Take 1 capsule (145 mcg total) by mouth daily before breakfast.  . lisdexamfetamine (VYVANSE) 70 MG  capsule Take 1 capsule (70 mg total) by mouth daily.  . nitrofurantoin, macrocrystal-monohydrate, (MACROBID) 100 MG capsule Take 1 capsule (100 mg total) by mouth 2 (two) times daily.  Marland Kitchen omeprazole (PRILOSEC) 40 MG capsule Take 1 capsule (40 mg total) by mouth 2 (two) times daily.  . phenazopyridine (PYRIDIUM) 200 MG tablet Take 1 tablet (200 mg total) by mouth 3 (three) times daily as needed for pain.  . Vitamin D, Ergocalciferol, (DRISDOL) 1.25 MG (50000 UT) CAPS capsule Take 1 capsule (50,000 Units total) by mouth every 7 (seven) days.  Marland Kitchen sulfamethoxazole-trimethoprim (BACTRIM) 400-80 MG tablet Take 1 tablet by mouth 2 (two) times daily.   No facility-administered encounter medications on file as of 11/15/2019.      Medical History: Past Medical History:  Diagnosis Date  . Pelvic pain in female   . Raynaud's disease   . Rheumatoid arthritis ([place])   . Vaginal Pap smear, abnormal   . Vitamin B 12 deficiency     Today's Vitals   11/15/19 0838  BP: 129/82  Pulse: (!) 114  Resp: 16  Temp: 97.6 F (36.4 C)  SpO2: 98%  Weight: 163 lb (73.9 kg)  Height: 5\' 9"  (1.753 m)   Body mass index is 24.07 kg/m.  Review of Systems  Constitutional: Positive for appetite change. Negative for chills, fatigue and unexpected weight change.       Patient eating mostly bland food.  HENT: Negative for congestion, postnasal drip, rhinorrhea, sneezing and sore throat.   Respiratory: Negative for cough, chest tightness, shortness of breath and wheezing.   Cardiovascular: Negative for chest pain and palpitations.  Gastrointestinal: Positive for abdominal pain and constipation. Negative for diarrhea, nausea and vomiting.  Genitourinary: Positive for flank pain. Negative for dysuria and frequency.  Musculoskeletal: Positive for back pain. Negative for arthralgias, joint swelling and neck pain.  Skin: Negative for rash.  Allergic/Immunologic: Negative for environmental allergies.  Neurological:  Negative for dizziness, tremors, numbness and headaches.  Hematological: Negative for adenopathy. Does not bruise/bleed easily.  Psychiatric/Behavioral: Negative for behavioral problems (Depression), sleep disturbance and suicidal ideas. The patient is not nervous/anxious.     Physical Exam Vitals and nursing note reviewed.  Constitutional:      General: She is not in acute distress.    Appearance: Normal appearance. She is well-developed. She is ill-appearing. She is not diaphoretic.  HENT:     Head: Normocephalic and atraumatic.     Mouth/Throat:     Pharynx: No oropharyngeal exudate.  Eyes:     Pupils: Pupils are equal, round, and reactive to light.  Neck:     Thyroid: No thyromegaly.     Vascular: No JVD.     Trachea: No tracheal deviation.  Cardiovascular:     Rate and Rhythm: Regular rhythm. Tachycardia present.     Heart sounds: Normal heart sounds. No murmur heard.  No friction rub. No gallop.   Pulmonary:     Effort: Pulmonary effort is normal. No respiratory distress.     Breath sounds: Normal breath sounds. No wheezing or rales.  Chest:     Chest wall: No tenderness.  Abdominal:     General: Bowel sounds are normal.     Palpations: Abdomen is soft.     Tenderness: There is abdominal tenderness.     Comments: Mild right upper quadrant and epigastric tenderness with palpation. No masses or organomegaly are noted   Genitourinary:    Comments: Urine sample positive for small blood and protein Musculoskeletal:        General: Normal range of motion.     Cervical back: Normal range of motion and neck supple.  Lymphadenopathy:     Cervical: No cervical adenopathy.  Skin:    General: Skin is warm and dry.  Neurological:     Mental Status: She is alert and oriented to person, place, and time.     Cranial Nerves: No cranial nerve deficit.  Psychiatric:        Mood and Affect: Mood normal.        Behavior: Behavior normal.        Thought Content: Thought content  normal.        Judgment: Judgment normal.     Assessment/Plan: 1. Right upper quadrant abdominal pain Unclear etiology. Differentials include cholecystitis, renal calculi, constipation, and excess bowel gas. Will get abdominal ultrasound for further evaluation. Ofnote, patient does have hepatic hemangioma - US Abdomen Complete; Future  2. Urinary tract infection with hematuria, site unspecified Start bactrim twice daily for 7 days. Send urine for culture and sensitivity and adjust antibiotics as indicated.  - sulfamethoxazole-trimethoprim (BACTRIM) 400-80 MG tablet; Take 1 tablet by mouth 2 (two) times daily.  Dispense: 14 tablet; Refill: 0 - POCT Urinalysis Dipstick - CULTURE, URINE COMPREHENSIVE  General Counseling: celenia hruska understanding of the findings of todays visit and agrees with plan of treatment. I have discussed any further diagnostic evaluation that  may be needed or ordered today. We also reviewed her medications today. she has been encouraged to call the office with any questions or concerns that should arise related to todays visit.    Counseling:  This patient was seen by Leretha Pol FNP Collaboration with Dr Lavera Guise as a part of collaborative care agreement  Orders Placed This Encounter  Procedures  . CULTURE, URINE COMPREHENSIVE  . US Abdomen Complete  . POCT Urinalysis Dipstick    Meds ordered this encounter  Medications  . sulfamethoxazole-trimethoprim (BACTRIM) 400-80 MG tablet    Sig: Take 1 tablet by mouth 2 (two) times daily.    Dispense:  14 tablet    Refill:  0    Order Specific Question:   Supervising Provider    Answer:   Lavera Guise [[id number]]    Time spent: 30 Minutes

## 2019-11-16 ENCOUNTER — Ambulatory Visit: Payer: 59

## 2019-11-16 DIAGNOSIS — R1011 Right upper quadrant pain: Secondary | ICD-10-CM

## 2019-11-18 NOTE — Progress Notes (Signed)
Waiting on culture ansd sensitivity results.

## 2019-11-19 LAB — CULTURE, URINE COMPREHENSIVE

## 2019-11-20 ENCOUNTER — Other Ambulatory Visit: Payer: Self-pay

## 2019-11-20 ENCOUNTER — Ambulatory Visit (INDEPENDENT_AMBULATORY_CARE_PROVIDER_SITE_OTHER): Payer: 59 | Admitting: Nurse Practitioner

## 2019-11-20 ENCOUNTER — Encounter: Payer: Self-pay | Admitting: Nurse Practitioner

## 2019-11-20 ENCOUNTER — Other Ambulatory Visit: Payer: Self-pay | Admitting: Nurse Practitioner

## 2019-11-20 VITALS — BP 108/62 | HR 60 | Temp 97.8°F | Resp 16 | Ht 69.0 in | Wt 163.0 lb

## 2019-11-20 DIAGNOSIS — D1803 Hemangioma of intra-abdominal structures: Secondary | ICD-10-CM

## 2019-11-20 DIAGNOSIS — R41840 Attention and concentration deficit: Secondary | ICD-10-CM | POA: Diagnosis not present

## 2019-11-20 DIAGNOSIS — M0579 Rheumatoid arthritis with rheumatoid factor of multiple sites without organ or systems involvement: Secondary | ICD-10-CM | POA: Diagnosis not present

## 2019-11-20 DIAGNOSIS — K769 Liver disease, unspecified: Secondary | ICD-10-CM

## 2019-11-20 MED ORDER — LISDEXAMFETAMINE DIMESYLATE 70 MG PO CAPS: 70.0000 mg | ORAL_CAPSULE | Freq: Every day | ORAL | 0 refills | Status: DC

## 2019-11-20 NOTE — Progress Notes (Signed)
Unremarkable us/ discuss at visit 10/26

## 2019-11-20 NOTE — Progress Notes (Signed)
[facility] [place], [place] [address]  Internal MEDICINE  Office Visit Note  Patient Name: Haley Wilkins  [id number]  [id number]  Date of Service: 12/09/2019  Chief Complaint  Patient presents with  . Follow-up  . Quality Metric Gaps    flu,tetnaus  . Gastroesophageal Reflux    The patient is here for follow up of abdominal ultrasound. Large hepatic hemangioma on ultrasound. Present on CT scan in 2017 and 2008. Recommended CT scan with contrast for better evaluation. She also needs to have a new prescription for her vyvanse. She was given a conner's self-assessment for adult ADD and scored 4/6 indicating she does have adult onset ADD. She has tried several different medications to help her stay focused and concentrate at work. She has also tried several different doses of vyvanse. We have discussed taking medication holidays on weekends and days off.       Current Medication: Outpatient Encounter Medications as of 11/20/2019  Medication Sig  . cyanocobalamin (,VITAMIN B-12,) 1000 MCG/ML injection Inject 1 mL (1,000 mcg total) into the muscle once a week.  Marland Kitchen ibuprofen (ADVIL) 600 MG tablet Take 1 tablet (600 mg total) by mouth every 8 (eight) hours as needed.  . Insulin Syringe-Needle U-100 32G X 5/16" 1 ML MISC 1 Units by Does not apply route every 30 (thirty) days.  Marland Kitchen levonorgestrel (MIRENA) 20 MCG/24HR IUD 1 each by Intrauterine route once.  . linaclotide (LINZESS) 145 MCG CAPS capsule Take 1 capsule (145 mcg total) by mouth daily before breakfast.  . lisdexamfetamine (VYVANSE) 70 MG capsule Take 1 capsule (70 mg total) by mouth daily.  Marland Kitchen omeprazole (PRILOSEC) 40 MG capsule Take 1 capsule (40 mg total) by mouth 2 (two) times daily.  . phenazopyridine (PYRIDIUM) 200 MG tablet Take 1 tablet (200 mg total) by mouth 3 (three) times daily as needed for pain.  Marland Kitchen sulfamethoxazole-trimethoprim (BACTRIM) 400-80 MG tablet Take 1 tablet by mouth 2 (two) times  daily.  . Vitamin D, Ergocalciferol, (DRISDOL) 1.25 MG (50000 UT) CAPS capsule Take 1 capsule (50,000 Units total) by mouth every 7 (seven) days.  . [DISCONTINUED] ALPRAZolam (XANAX) 0.25 MG tablet Take 1 tablet (0.25 mg total) by mouth daily as needed for anxiety.  . [DISCONTINUED] lisdexamfetamine (VYVANSE) 70 MG capsule Take 1 capsule (70 mg total) by mouth daily.  . [DISCONTINUED] nitrofurantoin, macrocrystal-monohydrate, (MACROBID) 100 MG capsule Take 1 capsule (100 mg total) by mouth 2 (two) times daily.   No facility-administered encounter medications on file as of 11/20/2019.    Surgical History: Past Surgical History:  Procedure Laterality Date  . CESAREAN SECTION    . LEEP  2006  . LITHOTRIPSY  2003    Medical History: Past Medical History:  Diagnosis Date  . Pelvic pain in female   . Raynaud's disease   . Rheumatoid arthritis ([place])   . Vaginal Pap smear, abnormal   . Vitamin B 12 deficiency     Family History: Family History  Problem Relation Age of Onset  . Diabetes Mother   . Diabetes Father   . Diabetes Maternal Grandfather     Social History   Socioeconomic History  . Marital status: Married    Spouse name: Not on file  . Number of children: Not on file  . Years of education: Not on file  . Highest education level: Not on file  Occupational History  . Not on file  Tobacco Use  . Smoking status: Never Smoker  .  Smokeless tobacco: Never Used  Vaping Use  . Vaping Use: Never used  Substance and Sexual Activity  . Alcohol use: Not Currently  . Drug use: No  . Sexual activity: Yes    Birth control/protection: I.U.D.    Comment: mirena  Other Topics Concern  . Not on file  Social History Narrative  . Not on file   Social Determinants of Health   Financial Resource Strain:   . Difficulty of Paying Living Expenses: Not on file  Food Insecurity:   . Worried About Charity fundraiser in the Last Year: Not on file  . Ran Out of Food in the Last  Year: Not on file  Transportation Needs:   . Lack of Transportation (Medical): Not on file  . Lack of Transportation (Non-Medical): Not on file  Physical Activity:   . Days of Exercise per Week: Not on file  . Minutes of Exercise per Session: Not on file  Stress:   . Feeling of Stress : Not on file  Social Connections:   . Frequency of Communication with Friends and Family: Not on file  . Frequency of Social Gatherings with Friends and Family: Not on file  . Attends Religious Services: Not on file  . Active Member of Clubs or Organizations: Not on file  . Attends Archivist Meetings: Not on file  . Marital Status: Not on file  Intimate Partner Violence:   . Fear of Current or Ex-Partner: Not on file  . Emotionally Abused: Not on file  . Physically Abused: Not on file  . Sexually Abused: Not on file      Review of Systems  Constitutional: Positive for appetite change. Negative for chills, fatigue and unexpected weight change.       Appetite improving since her last visit.   HENT: Negative for congestion, postnasal drip, rhinorrhea, sneezing and sore throat.   Respiratory: Negative for cough, chest tightness, shortness of breath and wheezing.   Cardiovascular: Negative for chest pain and palpitations.  Gastrointestinal: Positive for abdominal pain and constipation. Negative for diarrhea, nausea and vomiting.       Abdominal pain has improved.   Endocrine: Negative for cold intolerance, heat intolerance, polydipsia and polyuria.  Genitourinary: Negative for dysuria, flank pain, frequency and urgency.  Musculoskeletal: Positive for back pain. Negative for arthralgias, joint swelling and neck pain.  Skin: Negative for rash.  Allergic/Immunologic: Negative for environmental allergies.  Neurological: Negative for dizziness, tremors, numbness and headaches.  Hematological: Negative for adenopathy. Does not bruise/bleed easily.  Psychiatric/Behavioral: Positive for decreased  concentration. Negative for behavioral problems (Depression), sleep disturbance and suicidal ideas. The patient is not nervous/anxious.     Today's Vitals   11/20/19 1603  BP: 108/62  Pulse: 60  Resp: 16  Temp: 97.8 F (36.6 C)  SpO2: 99%  Weight: 163 lb (73.9 kg)  Height: 5\' 9"  (1.753 m)   Body mass index is 24.07 kg/m.  Physical Exam Vitals and nursing note reviewed.  Constitutional:      General: She is not in acute distress.    Appearance: Normal appearance. She is well-developed. She is not ill-appearing or diaphoretic.  HENT:     Head: Normocephalic and atraumatic.     Mouth/Throat:     Pharynx: No oropharyngeal exudate.  Eyes:     Pupils: Pupils are equal, round, and reactive to light.  Neck:     Thyroid: No thyromegaly.     Vascular: No JVD.  Trachea: No tracheal deviation.  Cardiovascular:     Rate and Rhythm: Regular rhythm. Tachycardia present.     Heart sounds: Normal heart sounds. No murmur heard.  No friction rub. No gallop.   Pulmonary:     Effort: Pulmonary effort is normal. No respiratory distress.     Breath sounds: Normal breath sounds. No wheezing or rales.  Chest:     Chest wall: No tenderness.  Abdominal:     Palpations: Abdomen is soft.  Genitourinary:    Comments: Urine sample positive for small blood and protein Musculoskeletal:        General: Normal range of motion.     Cervical back: Normal range of motion and neck supple.  Lymphadenopathy:     Cervical: No cervical adenopathy.  Skin:    General: Skin is warm and dry.  Neurological:     General: No focal deficit present.     Mental Status: She is alert and oriented to person, place, and time.     Cranial Nerves: No cranial nerve deficit.  Psychiatric:        Mood and Affect: Mood normal.        Behavior: Behavior normal.        Thought Content: Thought content normal.        Judgment: Judgment normal.    Assessment/Plan: 1. Hemangioma of liver Reviewed abdominal  ultrasound with patient which showed large hepatic hemangioma on ultrasound which was present on prior exams. A CT scan has been recommended for further evaluation. CT abdomen ordered today.  2. Attention and concentration deficit Patient scored 4/6 on Conner's self-assessment for adult ADD. willl continue vyvanse 70mg  daily as needed with medication holidays on weekends and days off from work. Single thirty day prescrition sent to her pharmacy today.  - lisdexamfetamine (VYVANSE) 70 MG capsule; Take 1 capsule (70 mg total) by mouth daily.  Dispense: 30 capsule; Refill: 0  3. Rheumatoid arthritis involving multiple sites with positive rheumatoid factor ([place]) Continue regular visits with rheumatology as scheduled.   General Counseling: honour schwieger understanding of the findings of todays visit and agrees with plan of treatment. I have discussed any further diagnostic evaluation that may be needed or ordered today. We also reviewed her medications today. she has been encouraged to call the office with any questions or concerns that should arise related to todays visit.   Refilled Controlled medications today. Reviewed risks and possible side effects associated with taking Stimulants. Combination of these drugs with other psychotropic medications could cause dizziness and drowsiness. Pt needs to Monitor symptoms and exercise caution in driving and operating heavy machinery to avoid damages to oneself, to others and to the surroundings. Patient verbalized understanding in this matter. Dependence and abuse for these drugs will be monitored closely. A Controlled substance policy and procedure is on file which allows [facility] to order a urine drug screen test at any visit. Patient understands and agrees with the plan..  This patient was seen by Leretha Pol FNP Collaboration with Dr Lavera Guise as a part of collaborative care agreement  Meds ordered this encounter  Medications  .  lisdexamfetamine (VYVANSE) 70 MG capsule    Sig: Take 1 capsule (70 mg total) by mouth daily.    Dispense:  30 capsule    Refill:  0    Order Specific Question:   Supervising Provider    Answer:   Lavera Guise [[id number]]    Total time spent: 30 Minutes  Time spent includes review of chart, medications, test results, and follow up plan with the patient.      Dr Lavera Guise Internal medicine

## 2019-11-20 NOTE — Progress Notes (Signed)
Patient started on Macrobid at time of visit.

## 2019-11-20 NOTE — Progress Notes (Signed)
Error above. Shew as started on bactrim at last visit.

## 2019-11-29 ENCOUNTER — Ambulatory Visit
Admission: RE | Admit: 2019-11-29 | Discharge: 2019-11-29 | Disposition: A | Discharge status: Home / Self Care | Payer: 59 | Source: Ambulatory Visit | Attending: Nurse Practitioner | Admitting: Nurse Practitioner

## 2019-11-29 ENCOUNTER — Other Ambulatory Visit: Payer: Self-pay | Admitting: Nurse Practitioner

## 2019-11-29 ENCOUNTER — Other Ambulatory Visit: Payer: Self-pay

## 2019-11-29 DIAGNOSIS — K769 Liver disease, unspecified: Secondary | ICD-10-CM

## 2019-11-29 DIAGNOSIS — N200 Calculus of kidney: Secondary | ICD-10-CM | POA: Diagnosis not present

## 2019-11-29 DIAGNOSIS — K7689 Other specified diseases of liver: Secondary | ICD-10-CM | POA: Diagnosis not present

## 2019-11-29 DIAGNOSIS — D1803 Hemangioma of intra-abdominal structures: Secondary | ICD-10-CM | POA: Diagnosis not present

## 2019-11-29 LAB — POCT I-STAT CREATININE: Creatinine, Ser: 0.9 mg/dL (ref 0.44–1.00)

## 2019-11-29 MED ORDER — IOHEXOL 300 MG/ML  SOLN
100.0000 mL | Freq: Once | INTRAMUSCULAR | Status: AC | PRN
  Administered 2019-11-29: 100 mL via INTRAVENOUS

## 2019-12-04 ENCOUNTER — Ambulatory Visit: Payer: 59 | Admitting: Nurse Practitioner

## 2019-12-09 DIAGNOSIS — D1803 Hemangioma of intra-abdominal structures: Secondary | ICD-10-CM | POA: Insufficient documentation

## 2019-12-09 DIAGNOSIS — M0579 Rheumatoid arthritis with rheumatoid factor of multiple sites without organ or systems involvement: Secondary | ICD-10-CM | POA: Insufficient documentation

## 2019-12-25 ENCOUNTER — Encounter: Payer: Self-pay | Admitting: Nurse Practitioner

## 2019-12-26 ENCOUNTER — Other Ambulatory Visit: Payer: Self-pay

## 2019-12-26 ENCOUNTER — Other Ambulatory Visit: Payer: Self-pay | Admitting: Nurse Practitioner

## 2019-12-26 DIAGNOSIS — R41840 Attention and concentration deficit: Secondary | ICD-10-CM

## 2019-12-26 MED ORDER — LISDEXAMFETAMINE DIMESYLATE 70 MG PO CAPS: 70.0000 mg | ORAL_CAPSULE | Freq: Every day | ORAL | 0 refills | Status: DC

## 2019-12-27 ENCOUNTER — Encounter: Payer: Self-pay | Admitting: Nurse Practitioner

## 2020-01-10 ENCOUNTER — Ambulatory Visit: Payer: 59 | Admitting: Nurse Practitioner

## 2020-01-10 ENCOUNTER — Other Ambulatory Visit: Payer: Self-pay

## 2020-01-10 ENCOUNTER — Other Ambulatory Visit: Payer: Self-pay | Admitting: Nurse Practitioner

## 2020-01-10 ENCOUNTER — Encounter: Payer: Self-pay | Admitting: Nurse Practitioner

## 2020-01-10 VITALS — BP 111/57 | HR 81 | Temp 97.8°F | Resp 16 | Ht 69.0 in | Wt 162.4 lb

## 2020-01-10 DIAGNOSIS — M7918 Myalgia, other site: Secondary | ICD-10-CM | POA: Diagnosis not present

## 2020-01-10 DIAGNOSIS — R41840 Attention and concentration deficit: Secondary | ICD-10-CM

## 2020-01-10 DIAGNOSIS — D1803 Hemangioma of intra-abdominal structures: Secondary | ICD-10-CM | POA: Diagnosis not present

## 2020-01-10 DIAGNOSIS — M0579 Rheumatoid arthritis with rheumatoid factor of multiple sites without organ or systems involvement: Secondary | ICD-10-CM | POA: Diagnosis not present

## 2020-01-10 MED ORDER — METHYLPREDNISOLONE 4 MG PO TBPK: ORAL_TABLET | ORAL | 0 refills | Status: DC

## 2020-01-10 MED ORDER — LISDEXAMFETAMINE DIMESYLATE 70 MG PO CAPS: 70.0000 mg | ORAL_CAPSULE | Freq: Every day | ORAL | 0 refills | Status: DC

## 2020-01-10 MED ORDER — CYCLOBENZAPRINE HCL 5 MG PO TABS: 5.0000 mg | ORAL_TABLET | Freq: Every evening | ORAL | 2 refills | Status: DC | PRN

## 2020-01-10 NOTE — Progress Notes (Signed)
[facility], [place] [address]  Internal MEDICINE  Office Visit Note  Patient Name: Haley Wilkins  [id number]  [id number]  Date of Service: 02/11/2020  Chief Complaint  Patient presents with  . Follow-up    Refill request    The patient is here for follow up visit.  -starting RA flare. Usually treated per rheumatology. Treated with low dose steroid taper to relieve pain/inflammation -continues to take vyvanse 70mg  daily. She scored 4/6 indicating she does have adult onset ADD. She continues to do well with this medication. She takes medication holidays on weekends and on days off.  -reviewed results of liver CT with the patient. This showed liver hemangioma which is increasing slightly in size. Now shows a second, small lesion, possibly a "flash fill" hemangioma. A follow up CT is recommended in 6 months. The patient does have a GI specialist due to the hemangioma.       Current Medication: Outpatient Encounter Medications as of 01/10/2020  Medication Sig  . cyanocobalamin (,VITAMIN B-12,) 1000 MCG/ML injection Inject 1 mL (1,000 mcg total) into the muscle once a week.  Marland Kitchen ibuprofen (ADVIL) 600 MG tablet Take 1 tablet (600 mg total) by mouth every 8 (eight) hours as needed.  . Insulin Syringe-Needle U-100 32G X 5/16" 1 ML MISC 1 Units by Does not apply route every 30 (thirty) days.  Marland Kitchen levonorgestrel (MIRENA) 20 MCG/24HR IUD 1 each by Intrauterine route once.  . linaclotide (LINZESS) 145 MCG CAPS capsule Take 1 capsule (145 mcg total) by mouth daily before breakfast.  . omeprazole (PRILOSEC) 40 MG capsule Take 1 capsule (40 mg total) by mouth 2 (two) times daily.  . phenazopyridine (PYRIDIUM) 200 MG tablet Take 1 tablet (200 mg total) by mouth 3 (three) times daily as needed for pain.  Marland Kitchen sulfamethoxazole-trimethoprim (BACTRIM) 400-80 MG tablet Take 1 tablet by mouth 2 (two) times daily.  . Vitamin D, Ergocalciferol, (DRISDOL) 1.25 MG (50000 UT) CAPS  capsule Take 1 capsule (50,000 Units total) by mouth every 7 (seven) days.  . [DISCONTINUED] lisdexamfetamine (VYVANSE) 70 MG capsule Take 1 capsule (70 mg total) by mouth daily.  . cyclobenzaprine (FLEXERIL) 5 MG tablet Take 1 tablet (5 mg total) by mouth at bedtime as needed for muscle spasms.  Marland Kitchen lisdexamfetamine (VYVANSE) 70 MG capsule Take 1 capsule (70 mg total) by mouth daily.  . methylPREDNISolone (MEDROL) 4 MG TBPK tablet Take by mouth as directed for 6 days  . [DISCONTINUED] lisdexamfetamine (VYVANSE) 70 MG capsule Take 1 capsule (70 mg total) by mouth daily.  . [DISCONTINUED] lisdexamfetamine (VYVANSE) 70 MG capsule Take 1 capsule (70 mg total) by mouth daily.   No facility-administered encounter medications on file as of 01/10/2020.    Surgical History: Past Surgical History:  Procedure Laterality Date  . CESAREAN SECTION    . LEEP  2006  . LITHOTRIPSY  2003    Medical History: Past Medical History:  Diagnosis Date  . Pelvic pain in female   . Raynaud's disease   . Rheumatoid arthritis ([place])   . Vaginal Pap smear, abnormal   . Vitamin B 12 deficiency     Family History: Family History  Problem Relation Age of Onset  . Diabetes Mother   . Diabetes Father   . Diabetes Maternal Grandfather     Social History   Socioeconomic History  . Marital status: Married    Spouse name: Not on file  . Number of children: Not on file  .  Years of education: Not on file  . Highest education level: Not on file  Occupational History  . Not on file  Tobacco Use  . Smoking status: Never Smoker  . Smokeless tobacco: Never Used  Vaping Use  . Vaping Use: Never used  Substance and Sexual Activity  . Alcohol use: Not Currently  . Drug use: No  . Sexual activity: Yes    Birth control/protection: I.U.D.    Comment: mirena  Other Topics Concern  . Not on file  Social History Narrative  . Not on file   Social Determinants of Health   Financial Resource Strain: Not on file   Food Insecurity: Not on file  Transportation Needs: Not on file  Physical Activity: Not on file  Stress: Not on file  Social Connections: Not on file  Intimate Partner Violence: Not on file      Review of Systems  Constitutional: Positive for fatigue. Negative for activity change, chills and unexpected weight change.  HENT: Negative for congestion, postnasal drip, rhinorrhea, sneezing and sore throat.   Respiratory: Negative for cough, chest tightness, shortness of breath and wheezing.   Cardiovascular: Negative for chest pain and palpitations.  Gastrointestinal: Negative for abdominal pain, constipation, diarrhea, nausea and vomiting.  Endocrine: Negative for cold intolerance, heat intolerance, polydipsia and polyuria.  Musculoskeletal: Negative for arthralgias, back pain, joint swelling and neck pain.       Generalized joint pain/tenderness.   Skin: Negative for rash.  Neurological: Negative.  Negative for tremors and numbness.  Hematological: Negative for adenopathy. Does not bruise/bleed easily.  Psychiatric/Behavioral: Positive for dysphoric mood. Negative for behavioral problems (Depression), sleep disturbance and suicidal ideas. The patient is nervous/anxious.    Today's Vitals   01/10/20 1615  BP: (!) 111/57  Pulse: 81  Resp: 16  Temp: 97.8 F (36.6 C)  SpO2: 99%  Weight: 162 lb 6.4 oz (73.7 kg)  Height: 5\' 9"  (1.753 m)   Body mass index is 23.98 kg/m.  Physical Exam Vitals and nursing note reviewed.  Constitutional:      General: She is not in acute distress.    Appearance: Normal appearance. She is well-developed and well-nourished. She is not diaphoretic.  HENT:     Head: Normocephalic and atraumatic.     Mouth/Throat:     Mouth: Oropharynx is clear and moist.     Pharynx: No oropharyngeal exudate.  Eyes:     Extraocular Movements: EOM normal.     Pupils: Pupils are equal, round, and reactive to light.  Neck:     Thyroid: No thyromegaly.     Vascular:  No JVD.     Trachea: No tracheal deviation.  Cardiovascular:     Rate and Rhythm: Normal rate and regular rhythm.     Heart sounds: Normal heart sounds. No murmur heard. No friction rub. No gallop.   Pulmonary:     Effort: Pulmonary effort is normal. No respiratory distress.     Breath sounds: Normal breath sounds. No wheezing or rales.  Chest:     Chest wall: No tenderness.  Abdominal:     Palpations: Abdomen is soft.  Musculoskeletal:        General: Normal range of motion.     Cervical back: Normal range of motion and neck supple.     Comments: Generalized joint pain with point tenderness present.   Lymphadenopathy:     Cervical: No cervical adenopathy.  Skin:    General: Skin is warm and dry.  Neurological:  General: No focal deficit present.     Mental Status: She is alert and oriented to person, place, and time.     Cranial Nerves: No cranial nerve deficit.  Psychiatric:        Mood and Affect: Mood and affect and mood normal.        Behavior: Behavior normal.        Thought Content: Thought content normal.        Judgment: Judgment normal.    Assessment/Plan: 1. Rheumatoid arthritis involving multiple sites with positive rheumatoid factor ([place]) Will start medrol dose pack. Take as directed for 6 days. Patient should follow up with rheumatology for persistent and worsening rheumatoid arthritis.  - methylPREDNISolone (MEDROL) 4 MG TBPK tablet; Take by mouth as directed for 6 days  Dispense: 21 tablet; Refill: 0  2. Muscle pain, lumbar May take flexeril 5mg  at bedtime as needed for muscle pain/spasms.  - cyclobenzaprine (FLEXERIL) 5 MG tablet; Take 1 tablet (5 mg total) by mouth at bedtime as needed for muscle spasms.  Dispense: 30 tablet; Refill: 2  3. Attention and concentration deficit Patient has been doing well on vyvanse 70mg  daily when needed for focus and concentration. She takes medication holidays on weekends and days off. Three 30 day prescriptions were  sent to her pharmacy. Dates are 01/10/2020, 02/08/2020, and 03/08/2020.  - lisdexamfetamine (VYVANSE) 70 MG capsule; Take 1 capsule (70 mg total) by mouth daily.  Dispense: 30 capsule; Refill: 0   4. Hemangioma of liver reviewed results of liver CT with the patient. This showed liver hemangioma which is increasing slightly in size. Now shows a second, small lesion, possibly a "flash fill" hemangioma. A follow up CT is recommended in 6 months. The patient does have a GI specialist due to the hemangioma.    General Counseling: Haley Wilkins understanding of the findings of todays visit and agrees with plan of treatment. I have discussed any further diagnostic evaluation that may be needed or ordered today. We also reviewed her medications today. she has been encouraged to call the office with any questions or concerns that should arise related to todays visit.  Refilled Controlled medications today. Reviewed risks and possible side effects associated with taking Stimulants. Combination of these drugs with other psychotropic medications could cause dizziness and drowsiness. Pt needs to Monitor symptoms and exercise caution in driving and operating heavy machinery to avoid damages to oneself, to others and to the surroundings. Patient verbalized understanding in this matter. Dependence and abuse for these drugs will be monitored closely. A Controlled substance policy and procedure is on file which allows [facility] to order a urine drug screen test at any visit. Patient understands and agrees with the plan.   This patient was seen by [place] with Dr Lavera Guise as a part of collaborative care agreement  Meds ordered this encounter  Medications  . DISCONTD: lisdexamfetamine (VYVANSE) 70 MG capsule    Sig: Take 1 capsule (70 mg total) by mouth daily.    Dispense:  30 capsule    Refill:  0    Order Specific Question:   Supervising Provider    Answer:   Lavera Guise [[id number]]  . methylPREDNISolone (MEDROL) 4 MG TBPK tablet    Sig: Take by mouth as directed for 6 days    Dispense:  21 tablet    Refill:  0    Order Specific Question:   Supervising Provider    Answer:  KHAN, FOZIA M [[id number]]  . cyclobenzaprine (FLEXERIL) 5 MG tablet    Sig: Take 1 tablet (5 mg total) by mouth at bedtime as needed for muscle spasms.    Dispense:  30 tablet    Refill:  2    Order Specific Question:   Supervising Provider    Answer:   Lavera Guise [[id number]]  . DISCONTD: lisdexamfetamine (VYVANSE) 70 MG capsule    Sig: Take 1 capsule (70 mg total) by mouth daily.    Dispense:  30 capsule    Refill:  0    Fill after 02/08/2020    Order Specific Question:   Supervising Provider    Answer:   Lavera Guise [[id number]]  . lisdexamfetamine (VYVANSE) 70 MG capsule    Sig: Take 1 capsule (70 mg total) by mouth daily.    Dispense:  30 capsule    Refill:  0    Fill after 03/08/2020    Order Specific Question:   Supervising Provider    Answer:   Lavera Guise [[id number]]    Total time spent: 30 Minutes   Time spent includes review of chart, medications, test results, and follow up plan with the patient.      Dr Lavera Guise Internal medicine

## 2020-02-11 DIAGNOSIS — M7918 Myalgia, other site: Secondary | ICD-10-CM | POA: Insufficient documentation

## 2020-02-18 ENCOUNTER — Other Ambulatory Visit: Payer: Self-pay | Admitting: Registered Nurse

## 2020-02-18 DIAGNOSIS — Z91018 Allergy to other foods: Secondary | ICD-10-CM | POA: Diagnosis not present

## 2020-02-18 DIAGNOSIS — M359 Systemic involvement of connective tissue, unspecified: Secondary | ICD-10-CM | POA: Diagnosis not present

## 2020-02-18 DIAGNOSIS — Z79899 Other long term (current) drug therapy: Secondary | ICD-10-CM | POA: Diagnosis not present

## 2020-04-08 ENCOUNTER — Ambulatory Visit: Payer: 59 | Admitting: Hospice and Palliative Medicine

## 2020-04-15 ENCOUNTER — Other Ambulatory Visit: Payer: Self-pay | Admitting: Nurse Practitioner

## 2020-04-15 ENCOUNTER — Ambulatory Visit (INDEPENDENT_AMBULATORY_CARE_PROVIDER_SITE_OTHER): Payer: 59 | Admitting: Nurse Practitioner

## 2020-04-15 ENCOUNTER — Encounter: Payer: Self-pay | Admitting: Nurse Practitioner

## 2020-04-15 ENCOUNTER — Other Ambulatory Visit: Payer: Self-pay

## 2020-04-15 VITALS — BP 106/67 | HR 86 | Temp 99.4°F | Ht 69.0 in | Wt 163.9 lb

## 2020-04-15 DIAGNOSIS — Z7689 Persons encountering health services in other specified circumstances: Secondary | ICD-10-CM

## 2020-04-15 DIAGNOSIS — M0579 Rheumatoid arthritis with rheumatoid factor of multiple sites without organ or systems involvement: Secondary | ICD-10-CM

## 2020-04-15 DIAGNOSIS — D1803 Hemangioma of intra-abdominal structures: Secondary | ICD-10-CM | POA: Diagnosis not present

## 2020-04-15 DIAGNOSIS — J3089 Other allergic rhinitis: Secondary | ICD-10-CM

## 2020-04-15 DIAGNOSIS — E559 Vitamin D deficiency, unspecified: Secondary | ICD-10-CM | POA: Diagnosis not present

## 2020-04-15 DIAGNOSIS — R41840 Attention and concentration deficit: Secondary | ICD-10-CM | POA: Diagnosis not present

## 2020-04-15 DIAGNOSIS — E538 Deficiency of other specified B group vitamins: Secondary | ICD-10-CM

## 2020-04-15 DIAGNOSIS — R5383 Other fatigue: Secondary | ICD-10-CM

## 2020-04-15 MED ORDER — CYANOCOBALAMIN 1000 MCG/ML IJ SOLN: 1000.0000 ug | INTRAMUSCULAR | 5 refills | Status: DC

## 2020-04-15 MED ORDER — LISDEXAMFETAMINE DIMESYLATE 70 MG PO CAPS: 70.0000 mg | ORAL_CAPSULE | Freq: Every day | ORAL | 0 refills | Status: DC

## 2020-04-15 MED ORDER — "INSULIN SYRINGE-NEEDLE U-100 32G X 5/16"" 1 ML MISC": 1.0000 [IU] | 5 refills | Status: DC

## 2020-04-15 MED ORDER — IBUPROFEN 600 MG PO TABS: 600.0000 mg | ORAL_TABLET | Freq: Three times a day (TID) | ORAL | 2 refills | Status: DC | PRN

## 2020-04-15 NOTE — Progress Notes (Unsigned)
New Patient Office Visit  Subjective:  Patient ID: Haley Wilkins, female    DOB: 21-Sep-1980  Age: 40 y.o. MRN: [id number]  CC:  Chief Complaint  Patient presents with  . New Patient (Initial Visit)    HPI Haley Wilkins presents to establish new primary care office. Haley Wilkins is changing primary care offices to promote continuity of care. Her current provider changed practices and the patient has chosen to stay with provider. The patient states that Haley Wilkins feels like something is going on, something more than rheumatoid arthritis or autoimmune in nature. Haley Wilkins states that Haley Wilkins has chronic symptoms of allergic rhinitis.  Haley Wilkins get red and itchy eyes, along with sneezing and nasal congestion. Haley Wilkins states that her head and her hands get very red and hot. Symptoms are associated with eating. Does not happen every time Haley Wilkins eats, but Haley Wilkins is unable to pinpoint it to anything in particular that Haley Wilkins is eating  States that what may seem to be causing symptoms one day, will not cause any symptoms the next. Haley Wilkins was referred to allergies per her rheumatologist. Both providers in [facility]. Haley Wilkins states that Haley Wilkins was contacted by the allergist stating that Haley Wilkins did not meet requirements to be seen by the allergy and immunology providers at that office. Patient states this was very discouraging. States that Haley Wilkins would still like to investigate foods and environmental factors which may be causing or contributing to these intermittent symptoms.  The patient states that Haley Wilkins and her entire family did contract COVID 19 at the end of January 2022. All had mild symptoms with moderate to severe fatigue. Haley Wilkins mentions that Haley Wilkins has started having palpitations again, seldomly, after having COVID 19. They do not cause dizziness, headache, or other bothersome symptoms. Haley Wilkins states that it is just something Haley Wilkins has noted since having COVID 19. The patient continues to see Rheumatologist in the [facility]. Haley Wilkins is currently on Plaquenil. Dosing  has not changed. Haley Wilkins states that, initially, symptoms associated with her RA did improve a great deal on this medication. Haley Wilkins states that as time goes on, symptoms are started to worsen.  The patient does have adult onset ADD. Haley Wilkins was given a conner's self-assessment for adult ADD on November 20, 2019. Haley Wilkins scored 4/6 indicating Haley Wilkins does have adult onset ADD. Haley Wilkins has tried several different medications to help her stay focused and concentrate at work. Haley Wilkins has also tried several different doses of vyvanse. We have discussed taking medication holidays on weekends and days off. her PDMP profile was reviewed today. Her Overdose Risk Score is 000. Her fill history for Vyvanse is appropriate.   Past Medical History:  Diagnosis Date  . Pelvic pain in female   . Raynaud's disease   . Rheumatoid arthritis ([place])   . Vaginal Pap smear, abnormal   . Vitamin B 12 deficiency     Past Surgical History:  Procedure Laterality Date  . CESAREAN SECTION    . LEEP  2006  . LITHOTRIPSY  2003    Family History  Problem Relation Age of Onset  . Diabetes Mother   . Diabetes Father   . Diabetes Maternal Grandfather     Social History   Socioeconomic History  . Marital status: Married    Spouse name: Not on file  . Number of children: Not on file  . Years of education: Not on file  . Highest education level: Not on file  Occupational History  . Occupation: team lead billing  Tobacco Use  . Smoking status: Never Smoker  . Smokeless tobacco: Never Used  Vaping Use  . Vaping Use: Never used  Substance and Sexual Activity  . Alcohol use: Not Currently  . Drug use: No  . Sexual activity: Yes    Birth control/protection: I.U.D.    Comment: mirena  Other Topics Concern  . Not on file  Social History Narrative  . Not on file   Social Determinants of Health   Financial Resource Strain: Not on file  Food Insecurity: Not on file  Transportation Needs: Not on file  Physical Activity: Not on file   Stress: Not on file  Social Connections: Not on file  Intimate Partner Violence: Not on file    ROS Review of Systems  Constitutional: Positive for fatigue. Negative for activity change, appetite change, chills and fever.  HENT: Positive for congestion and postnasal drip. Negative for sinus pain.   Eyes: Negative.   Respiratory: Negative for cough and wheezing.   Cardiovascular: Negative for chest pain and palpitations.  Gastrointestinal: Positive for constipation.       Chronic, intermittent constipation.   Endocrine: Negative.   Musculoskeletal: Positive for arthralgias and myalgias. Negative for back pain.  Skin: Negative for rash.  Allergic/Immunologic: Positive for environmental allergies and food allergies.  Neurological: Positive for headaches. Negative for dizziness and weakness.  Psychiatric/Behavioral: Positive for decreased concentration. The patient is nervous/anxious.   All other systems reviewed and are negative.   Objective:   Today's Vitals   04/15/20 1533  BP: 106/67  Pulse: 86  Temp: 99.4 F (37.4 C)  SpO2: 100%  Weight: 163 lb 14.4 oz (74.3 kg)  Height: 5\' 9"  (1.753 m)   Body mass index is 24.2 kg/m. Physical Exam Vitals and nursing note reviewed.  Constitutional:      Appearance: Normal appearance. Haley Wilkins is well-developed.  HENT:     Head: Normocephalic and atraumatic.     Nose: Nose normal.  Eyes:     Pupils: Pupils are equal, round, and reactive to light.  Cardiovascular:     Rate and Rhythm: Normal rate and regular rhythm.     Pulses: Normal pulses.     Heart sounds: Normal heart sounds.     Comments: Raynaud's syndrome evident on both hands.  Pulmonary:     Effort: Pulmonary effort is normal.     Breath sounds: Normal breath sounds.  Abdominal:     Palpations: Abdomen is soft.  Musculoskeletal:        General: Normal range of motion.     Cervical back: Normal range of motion and neck supple.  Skin:    General: Skin is warm and dry.      Capillary Refill: Capillary refill takes 2 to 3 seconds.  Neurological:     General: No focal deficit present.     Mental Status: Haley Wilkins is alert and oriented to person, place, and time.  Psychiatric:        Mood and Affect: Mood normal.        Behavior: Behavior normal.        Thought Content: Thought content normal.        Judgment: Judgment normal.     Assessment & Plan:  1. Encounter to establish care Appointment today to establish primary care provider.   2. Chronic non-seasonal allergic rhinitis Check allergy/sensitivity panels to food, environmental factors, and gluten.  - Gluten Sensitivity Screen; Future - Food Allergy Profile; Future - Allergens w/Comp Rflx Area 2;  Future  3. Other fatigue Add anemia and thyroid panels, vitamin D, and HgbA1c for further evaluation.  - Gluten Sensitivity Screen; Future - Food Allergy Profile; Future - CBC With Differential; Future - Comprehensive metabolic panel; Future - TSH + free T4; Future - Iron, TIBC and Ferritin Panel; Future - Vitamin B12; Future - Hemoglobin A1c; Future  4. Rheumatoid arthritis involving multiple sites with positive rheumatoid factor ([place]) May take ibuprofen 600mg  up to three timea daily as needed for pain/infmallation.  - ibuprofen (ADVIL) 600 MG tablet; Take 1 tablet (600 mg total) by mouth every 8 (eight) hours as needed.  Dispense: 90 tablet; Refill: 2 - Comprehensive metabolic panel; Future  5. Vitamin B12 deficiency Check b12 level. May do b12 injections once weekly for 1 month then once monthly after that.  - cyanocobalamin (,VITAMIN B-12,) 1000 MCG/ML injection; Inject 1 mL (1,000 mcg total) into the muscle once a week.  Dispense: 4 mL; Refill: 5 - Insulin Syringe-Needle U-100 32G X 5/16" 1 ML MISC; 1 Units by Does not apply route once a week.  Dispense: 4 each; Refill: 5 - Iron, TIBC and Ferritin Panel; Future - Vitamin B12; Future  6. Hemangioma of liver Check liver functions.  -  Comprehensive metabolic panel; Future  7. Vitamin D deficiency Check vitamin d level.  - Vitamin D 1,25 dihydroxy; Future  8. Attention and concentration deficit May continue vyvanse 70mg  daily when needed, taking medication holidays on weekends and days off. Three 30 day prescriptions to be sent to the pharmacy. Dates are 04/15/2020, 4/20/202, and 06/11/2020 - lisdexamfetamine (VYVANSE) 70 MG capsule; Take 1 capsule (70 mg total) by mouth daily.  Dispense: 30 capsule; Refill: 0    Problem List Items Addressed This Visit      Cardiovascular and Mediastinum   Hemangioma of liver   Relevant Orders   Comprehensive metabolic panel     Respiratory   Chronic non-seasonal allergic rhinitis   Relevant Orders   Gluten Sensitivity Screen   Food Allergy Profile   Allergens w/Comp Rflx Area 2     Musculoskeletal and Integument   Rheumatoid arthritis involving multiple sites with positive rheumatoid factor ([facility])   Relevant Medications   ibuprofen (ADVIL) 600 MG tablet   Other Relevant Orders   Comprehensive metabolic panel     Other   Other fatigue   Relevant Orders   Gluten Sensitivity Screen   Food Allergy Profile   CBC With Differential   Comprehensive metabolic panel   TSH + free T4   Iron, TIBC and Ferritin Panel   Vitamin B12   Hemoglobin A1c   Attention and concentration deficit   Relevant Medications   lisdexamfetamine (VYVANSE) 70 MG capsule   Vitamin B12 deficiency   Relevant Medications   cyanocobalamin (,VITAMIN B-12,) 1000 MCG/ML injection   Insulin Syringe-Needle U-100 32G X 5/16" 1 ML MISC   Other Relevant Orders   Iron, TIBC and Ferritin Panel   Vitamin B12   Vitamin D deficiency   Relevant Orders   Vitamin D 1,25 dihydroxy   Encounter to establish care - Primary      Outpatient Encounter Medications as of 04/15/2020  Medication Sig  . cyclobenzaprine (FLEXERIL) 5 MG tablet Take 1 tablet (5 mg total) by mouth at bedtime as needed for muscle spasms.  Marland Kitchen  levonorgestrel (MIRENA) 20 MCG/24HR IUD 1 each by Intrauterine route once.  . linaclotide (LINZESS) 145 MCG CAPS capsule Take 1 capsule (145 mcg total) by mouth daily before breakfast.  .  omeprazole (PRILOSEC) 40 MG capsule Take 1 capsule (40 mg total) by mouth 2 (two) times daily.  . phenazopyridine (PYRIDIUM) 200 MG tablet Take 1 tablet (200 mg total) by mouth 3 (three) times daily as needed for pain.  . Vitamin D, Ergocalciferol, (DRISDOL) 1.25 MG (50000 UT) CAPS capsule Take 1 capsule (50,000 Units total) by mouth every 7 (seven) days.  . [DISCONTINUED] cyanocobalamin (,VITAMIN B-12,) 1000 MCG/ML injection Inject 1 mL (1,000 mcg total) into the muscle once a week.  . [DISCONTINUED] ibuprofen (ADVIL) 600 MG tablet Take 1 tablet (600 mg total) by mouth every 8 (eight) hours as needed.  . [DISCONTINUED] Insulin Syringe-Needle U-100 32G X 5/16" 1 ML MISC 1 Units by Does not apply route every 30 (thirty) days.  . [DISCONTINUED] lisdexamfetamine (VYVANSE) 70 MG capsule Take 1 capsule (70 mg total) by mouth daily.  . cyanocobalamin (,VITAMIN B-12,) 1000 MCG/ML injection Inject 1 mL (1,000 mcg total) into the muscle once a week.  Marland Kitchen ibuprofen (ADVIL) 600 MG tablet Take 1 tablet (600 mg total) by mouth every 8 (eight) hours as needed.  . Insulin Syringe-Needle U-100 32G X 5/16" 1 ML MISC 1 Units by Does not apply route once a week.  . lisdexamfetamine (VYVANSE) 70 MG capsule Take 1 capsule (70 mg total) by mouth daily.  . methylPREDNISolone (MEDROL) 4 MG TBPK tablet Take by mouth as directed for 6 days  . sulfamethoxazole-trimethoprim (BACTRIM) 400-80 MG tablet Take 1 tablet by mouth 2 (two) times daily.   No facility-administered encounter medications on file as of 04/15/2020.   Time spent with the patient was approximately 45 minutes. This time included reviewing progress notes, labs, imaging studies, and discussing plan for follow up.   Follow-up: Return in about 3 months (around 07/16/2020) for  patient will need lab appointment in next week. will enter labs for future draw. Marland Kitchen   Ronnell Freshwater, NP

## 2020-04-15 NOTE — Patient Instructions (Signed)
Food Allergy  A food allergy is when your body reacts to a food in a way that is not normal. The reaction can be mild or very bad. A very bad allergic reaction is called an anaphylactic reaction (anaphylaxis). A very bad reaction is an emergency. What are the causes? Common foods that can cause a reaction are:  Milk.  Eggs.  Peanuts.  Seafood.  Wheat.  Soy.  Tree nuts. These include pecans, walnuts, and cashews. What are the signs or symptoms? Signs of a mild reaction  Stuffy nose.  Tingling in the mouth.  An itchy, red rash.  Throwing up (vomiting).  Watery poop (diarrhea). Signs of a very bad reaction  Itchy, red, swollen areas of skin (hives).  Swelling of your: ? Eyes. ? Lips. ? Face. ? Mouth. ? Tongue. ? Throat.  Trouble with: ? Breathing. ? Talking. ? Swallowing.  Noisy breathing (wheezing).  Passing out (fainting).  Having any of these feelings: ? Warmth in your face (flushed). ? Dizziness. ? Light-headedness.  Pain in your belly. Follow these instructions at home: If you are being tested for an allergy:  Avoid foods as told by your doctor (elimination diet).  Write down what you eat and drink in a notebook (food diary). Each day, write: ? What you eat and drink and when. ? What problems you have and when. If you have a very bad allergy:  Wear a bracelet or necklace that says you have an allergy.  Carry your allergy kit (anaphylaxis kit) or an allergy shot (epinephrine injection) with you all the time. Use them as told by your doctor.  Make sure that you, your family, and your boss know: ? The signs of a very bad reaction. ? How to use your allergy kit. ? How to give an allergy shot.  If you use an allergy shot: ? Get more right away in case you have another reaction. ? Get help. You can have a life-threatening reaction after taking the medicine (rebound anaphylaxis).   General instructions  Avoid the foods that you are allergic  to.  Read food labels. Look for ingredients that you are allergic to.  When you are at a restaurant, tell your server that you have an allergy. Ask if your meal has an ingredient that you are allergic to.  Take medicines only as told by your doctor. Do not drive until the medicine has worn off, unless your doctor says it is okay.  Tell all people who care for you that you have a food allergy. This includes your doctor and dentist.  If you think that you might be allergic to something else, talk with your doctor. Do not eat a food to see if you are allergic to it without talking with your doctor first. Contact a doctor if you:  Have signs of a reaction that have not gone away after 2 days.  Get worse.  Have new signs of a reaction. Get help right away if you have signs of a very bad reaction:  Itchy, red, swollen areas of skin.  Swelling of your: ? Eyes. ? Lips. ? Face. ? Mouth. ? Tongue. ? Throat.  Trouble with: ? Breathing. ? Talking. ? Swallowing.  Noisy breathing (wheezing).  Passing out (fainting).  Having any of these feelings: ? Warmth in your face (flushed). ? Dizziness. ? Light-headedness. ? Pain in your belly. These signs may be an emergency. Do not wait to see if the signs will go away. Use your allergy  shot or allergy kit as you have been told. Get medical help right away. Call your local emergency services (911 in the [place] Do not drive yourself to the hospital. If you had to use your allergy pen, you must go to the emergency room even if the medicine seems to be working. This is important because another allergic reaction may happen within 3 days. Summary  A food allergy is when your body reacts to a food in a way that is not normal.  Avoid the foods that you are allergic to.  Wear a bracelet or necklace that says you have an allergy.  Carry your allergy kit (anaphylaxis kit) or an allergy shot (epinephrine injection) with you all the time. Use them  as told by your doctor. This information is not intended to replace advice given to you by your health care provider. Make sure you discuss any questions you have with your health care provider. Document Revised: 10/23/2019 Document Reviewed: 10/23/2019 Elsevier Patient Education  2021 Elsevier Inc.  

## 2020-04-16 ENCOUNTER — Other Ambulatory Visit: Payer: Self-pay | Admitting: Nurse Practitioner

## 2020-04-16 DIAGNOSIS — Z7689 Persons encountering health services in other specified circumstances: Secondary | ICD-10-CM | POA: Insufficient documentation

## 2020-04-16 DIAGNOSIS — J3089 Other allergic rhinitis: Secondary | ICD-10-CM | POA: Insufficient documentation

## 2020-04-16 MED ORDER — LISDEXAMFETAMINE DIMESYLATE 70 MG PO CAPS: 70.0000 mg | ORAL_CAPSULE | Freq: Every day | ORAL | 0 refills | Status: DC

## 2020-04-18 ENCOUNTER — Other Ambulatory Visit: Payer: Self-pay

## 2020-04-18 ENCOUNTER — Other Ambulatory Visit: Payer: 59

## 2020-04-18 DIAGNOSIS — D1803 Hemangioma of intra-abdominal structures: Secondary | ICD-10-CM

## 2020-04-18 DIAGNOSIS — R5383 Other fatigue: Secondary | ICD-10-CM

## 2020-04-18 DIAGNOSIS — M0579 Rheumatoid arthritis with rheumatoid factor of multiple sites without organ or systems involvement: Secondary | ICD-10-CM

## 2020-04-18 DIAGNOSIS — E559 Vitamin D deficiency, unspecified: Secondary | ICD-10-CM

## 2020-04-18 DIAGNOSIS — J3089 Other allergic rhinitis: Secondary | ICD-10-CM

## 2020-04-18 DIAGNOSIS — E538 Deficiency of other specified B group vitamins: Secondary | ICD-10-CM

## 2020-04-18 NOTE — Addendum Note (Signed)
Addended by: Mickel Crow on: 04/18/2020 09:25 AM   Modules accepted: Orders

## 2020-04-19 LAB — COMPREHENSIVE METABOLIC PANEL
ALT: 10 IU/L (ref 0–32)
AST: 17 IU/L (ref 0–40)
Albumin/Globulin Ratio: 1.7 (ref 1.2–2.2)
Albumin: 4.6 g/dL (ref 3.8–4.8)
Alkaline Phosphatase: 63 IU/L (ref 44–121)
BUN/Creatinine Ratio: 13 (ref 9–23)
BUN: 12 mg/dL (ref 6–24)
Bilirubin Total: 0.9 mg/dL (ref 0.0–1.2)
CO2: 18 mmol/L — ABNORMAL LOW (ref 20–29)
Calcium: 9.3 mg/dL (ref 8.7–10.2)
Chloride: 106 mmol/L (ref 96–106)
Creatinine, Ser: 0.9 mg/dL (ref 0.57–1.00)
Globulin, Total: 2.7 g/dL (ref 1.5–4.5)
Glucose: 87 mg/dL (ref 65–99)
Potassium: 4.3 mmol/L (ref 3.5–5.2)
Sodium: 144 mmol/L (ref 134–144)
Total Protein: 7.3 g/dL (ref 6.0–8.5)
eGFR: 82 mL/min/{1.73_m2} (ref >59)

## 2020-04-19 LAB — HEMOGLOBIN A1C
Est. average glucose Bld gHb Est-mCnc: 91 mg/dL
Hgb A1c MFr Bld: 4.8 % (ref 4.8–5.6)

## 2020-04-19 LAB — CBC WITH DIFFERENTIAL/PLATELET
Basophils Absolute: 0 10*3/uL (ref 0.0–0.2)
Basos: 1 %
EOS (ABSOLUTE): 0.1 10*3/uL (ref 0.0–0.4)
Eos: 2 %
Hematocrit: 40.4 % (ref 34.0–46.6)
Hemoglobin: 13 g/dL (ref 11.1–15.9)
Immature Grans (Abs): 0 10*3/uL (ref 0.0–0.1)
Immature Granulocytes: 1 %
Lymphocytes Absolute: 0.8 10*3/uL (ref 0.7–3.1)
Lymphs: 20 %
MCH: 31.5 pg (ref 26.6–33.0)
MCHC: 32.2 g/dL (ref 31.5–35.7)
MCV: 98 fL — ABNORMAL HIGH (ref 79–97)
Monocytes Absolute: 0.3 10*3/uL (ref 0.1–0.9)
Monocytes: 8 %
Neutrophils Absolute: 2.7 10*3/uL (ref 1.4–7.0)
Neutrophils: 68 %
Platelets: 198 10*3/uL (ref 150–450)
RBC: 4.13 x10E6/uL (ref 3.77–5.28)
RDW: 12 % (ref 11.7–15.4)
WBC: 3.9 10*3/uL (ref 3.4–10.8)

## 2020-04-19 LAB — VITAMIN D 25 HYDROXY (VIT D DEFICIENCY, FRACTURES): Vit D, 25-Hydroxy: 22.6 ng/mL — ABNORMAL LOW (ref 30.0–100.0)

## 2020-04-19 LAB — IRON AND TIBC
Iron Saturation: 45 % (ref 15–55)
Iron: 144 ug/dL (ref 27–159)
Total Iron Binding Capacity: 323 ug/dL (ref 250–450)
UIBC: 179 ug/dL (ref 131–425)

## 2020-04-19 LAB — T4, FREE: Free T4: 1.07 ng/dL (ref 0.82–1.77)

## 2020-04-19 LAB — VITAMIN B12: Vitamin B-12: 326 pg/mL (ref 232–1245)

## 2020-04-19 LAB — TSH: TSH: 2.2 u[IU]/mL (ref 0.450–4.500)

## 2020-04-19 LAB — FERRITIN: Ferritin: 66 ng/mL (ref 15–150)

## 2020-04-23 NOTE — Progress Notes (Signed)
Vitamin d deficient. Other labs look good. Waiting on all results.

## 2020-04-24 ENCOUNTER — Other Ambulatory Visit: Payer: Self-pay | Admitting: Nurse Practitioner

## 2020-04-24 ENCOUNTER — Encounter: Payer: Self-pay | Admitting: Nurse Practitioner

## 2020-04-24 DIAGNOSIS — E559 Vitamin D deficiency, unspecified: Secondary | ICD-10-CM

## 2020-04-24 DIAGNOSIS — J3089 Other allergic rhinitis: Secondary | ICD-10-CM

## 2020-04-24 LAB — F004-IGE WHEAT: Wheat IgE: <0.1 kU/L

## 2020-04-24 LAB — FOOD ALLERGY PROFILE
Allergen Corn, IgE: <0.1 kU/L
Clam IgE: <0.1 kU/L
Codfish IgE: <0.1 kU/L
Egg White IgE: <0.1 kU/L
Milk IgE: <0.1 kU/L
Peanut IgE: <0.1 kU/L
Scallop IgE: <0.1 kU/L
Sesame Seed IgE: <0.1 kU/L
Shrimp IgE: <0.1 kU/L
Soybean IgE: <0.1 kU/L
Walnut IgE: <0.1 kU/L

## 2020-04-24 LAB — PANEL 606578
E094-IgE Fel d 1: 0.23 kU/L — AB
E220-IgE Fel d 2: <0.1 kU/L
E228-IgE Fel d 4: <0.1 kU/L

## 2020-04-24 LAB — ALLERGENS W/COMP RFLX AREA 2
Alternaria Alternata IgE: <0.1 kU/L
Aspergillus Fumigatus IgE: <0.1 kU/L
Bermuda Grass IgE: <0.1 kU/L
Cedar, Mountain IgE: <0.1 kU/L
Cladosporium Herbarum IgE: <0.1 kU/L
Cockroach, German IgE: <0.1 kU/L
Common Silver Birch IgE: <0.1 kU/L
Cottonwood IgE: <0.1 kU/L
D Farinae IgE: 4.8 kU/L — AB
D Pteronyssinus IgE: 3.56 kU/L — AB
E001-IgE Cat Dander: 0.4 kU/L — AB
E005-IgE Dog Dander: 0.1 kU/L — AB
Elm, American IgE: <0.1 kU/L
IgE (Immunoglobulin E), Serum: 25 IU/mL (ref 6–495)
Johnson Grass IgE: <0.1 kU/L
Maple/Box Elder IgE: <0.1 kU/L
Mouse Urine IgE: <0.1 kU/L
Oak, White IgE: <0.1 kU/L
Pecan, Hickory IgE: <0.1 kU/L
Penicillium Chrysogen IgE: <0.1 kU/L
Pigweed, Rough IgE: <0.1 kU/L
Ragweed, Short IgE: 0.11 kU/L — AB
Sheep Sorrel IgE Qn: <0.1 kU/L
Timothy Grass IgE: 0.61 kU/L — AB
White Mulberry IgE: <0.1 kU/L

## 2020-04-24 LAB — GLUTEN SENSITIVITY SCREEN: tTG/DGP Screen: NEGATIVE

## 2020-04-24 LAB — ANTIGLIADIN IGG (NATIVE): Antigliadin IgG (native): 11 units (ref 0–19)

## 2020-04-24 LAB — NOTE:

## 2020-04-24 MED ORDER — VITAMIN D (ERGOCALCIFEROL) 1.25 MG (50000 UNIT) PO CAPS: 50000.0000 [IU] | ORAL_CAPSULE | ORAL | 5 refills | Status: DC

## 2020-04-24 MED ORDER — MONTELUKAST SODIUM 10 MG PO TABS: 10.0000 mg | ORAL_TABLET | Freq: Every day | ORAL | 1 refills | Status: DC

## 2020-04-24 NOTE — Progress Notes (Signed)
Sent patient myChart message regarding lab results. Added drisdol weekly. She should restart weekly B12 injections for 4 to 6 weeks, then monthly. Significant environmental allergies. Negative food allergies and gluten sensitivity.

## 2020-04-24 NOTE — Progress Notes (Signed)
Added singulair every day in the evenings to help with allergic symptoms. Sent to [facility].

## 2020-04-24 NOTE — Progress Notes (Signed)
Added back drisdol 50000 weekly for next 6 months due to vitamin d deficiency,.

## 2020-04-29 ENCOUNTER — Other Ambulatory Visit: Payer: Self-pay

## 2020-05-26 ENCOUNTER — Other Ambulatory Visit: Payer: Self-pay

## 2020-05-26 MED FILL — Lisdexamfetamine Dimesylate Cap 70 MG: ORAL | 30 days supply | Qty: 30 | Fill #0 | Status: AC

## 2020-06-04 ENCOUNTER — Encounter: Payer: Self-pay | Admitting: Nurse Practitioner

## 2020-06-10 ENCOUNTER — Other Ambulatory Visit: Payer: Self-pay

## 2020-06-10 MED FILL — Ibuprofen Tab 600 MG: ORAL | 30 days supply | Qty: 90 | Fill #0 | Status: AC

## 2020-06-25 ENCOUNTER — Other Ambulatory Visit: Payer: Self-pay

## 2020-06-25 MED FILL — Lisdexamfetamine Dimesylate Cap 70 MG: ORAL | 30 days supply | Qty: 30 | Fill #0 | Status: AC

## 2020-07-14 ENCOUNTER — Telehealth: Payer: Self-pay | Admitting: Nurse Practitioner

## 2020-07-14 NOTE — Telephone Encounter (Signed)
Patient is experiencing a fever around 100, aches, and COVID at home test was negative, on Saturday. Please advise, thanks

## 2020-07-14 NOTE — Telephone Encounter (Signed)
Patient is going to call back with results of a COVID test. Please schedule her for a telehealth appointment. Soonest available

## 2020-07-16 ENCOUNTER — Encounter: Payer: Self-pay | Admitting: Nurse Practitioner

## 2020-07-16 ENCOUNTER — Ambulatory Visit (INDEPENDENT_AMBULATORY_CARE_PROVIDER_SITE_OTHER): Payer: 59 | Admitting: Nurse Practitioner

## 2020-07-16 ENCOUNTER — Other Ambulatory Visit: Payer: Self-pay

## 2020-07-16 VITALS — BP 128/79 | HR 88 | Ht 69.0 in | Wt 162.0 lb

## 2020-07-16 DIAGNOSIS — R059 Cough, unspecified: Secondary | ICD-10-CM | POA: Diagnosis not present

## 2020-07-16 DIAGNOSIS — J0140 Acute pansinusitis, unspecified: Secondary | ICD-10-CM | POA: Diagnosis not present

## 2020-07-16 DIAGNOSIS — R41840 Attention and concentration deficit: Secondary | ICD-10-CM | POA: Diagnosis not present

## 2020-07-16 MED ORDER — PREDNISONE 10 MG PO TABS
ORAL_TABLET | ORAL | 0 refills | Status: DC
  Filled 2020-07-16: qty 21, 6d supply, fill #0

## 2020-07-16 MED ORDER — LISDEXAMFETAMINE DIMESYLATE 70 MG PO CAPS
70.0000 mg | ORAL_CAPSULE | Freq: Every day | ORAL | 0 refills | Status: DC
  Filled 2020-07-16 – 2020-07-22 (×2): qty 30, 30d supply, fill #0

## 2020-07-16 MED ORDER — DOXYCYCLINE HYCLATE 100 MG PO TABS
100.0000 mg | ORAL_TABLET | Freq: Two times a day (BID) | ORAL | 0 refills | Status: DC
  Filled 2020-07-16: qty 20, 10d supply, fill #0

## 2020-07-16 MED ORDER — LISDEXAMFETAMINE DIMESYLATE 70 MG PO CAPS
70.0000 mg | ORAL_CAPSULE | Freq: Every day | ORAL | 0 refills | Status: DC
  Filled 2020-07-16: qty 30, 30d supply, fill #0

## 2020-07-16 MED ORDER — HYDROCOD POLST-CPM POLST ER 10-8 MG/5ML PO SUER
5.0000 mL | Freq: Two times a day (BID) | ORAL | 0 refills | Status: DC | PRN
Start: 2020-07-16 — End: 2021-01-06
  Filled 2020-07-16: qty 115, 12d supply, fill #0

## 2020-07-16 NOTE — Progress Notes (Signed)
Virtual Visit via Telephone Note  I connected with Haley Wilkins on 07/16/20 at  3:50 PM EDT by telephone and verified that I am speaking with the correct person using two identifiers.  Location: Patient: home Provider: [place] [facility] at [facility]     I discussed the limitations, risks, security and privacy concerns of performing an evaluation and management service by telephone and the availability of in person appointments. I also discussed with the patient that there may be a patient responsible charge related to this service. The patient expressed understanding and agreed to proceed.   History of Present Illness: The  patient states she is having symptoms of moderate congestion, headache, body aches, chills, and cough. She states that cough is productive. She is bringing up green tinged mucus. She states that cough is most severe at night. She is unable to sleep because cough is keeping her awake. She had fever up to 101 on Monday. She has been taking over the counter cold an flu medication without relief. She states that symptoms are moderate and getting worse. She has taken multiple tests for COVID 19 with negative results. She states that her mother has similar symptoms. Has also tested negative for COVID 19. Patient's mother went to see her provider on Monday and was put on antibiotics for severe sinus infection.  The patient states that she also needs to have refills for her vyvanse. Her appointment was initially scheduled for this reason. She currently takes 70mg  daily when needed. She does take medication holidays on weekends and days off work. She states that this medication helps her to stay focused and on track at work and she does not have negative side effects from taking this medication.    Observations/Objective:  The patient is alert and oriented. She is pleasant and answers all questions appropriately. Breathing is non-labored. She is in no acute distress at this time.   She sounds nasally congested.   Assessment and Plan: 1. Acute non-recurrent pansinusitis Start doxycycline 100mg  daily for next ten days. Rest and increase fluids. Continue using OTC medication to control symptoms.  Add prednisone taper. Take as directed for 6 days. Adised herto hold vyvanse while on prednisone taper. She voiced understanding of the instructions.  - doxycycline (VIBRA-TABS) 100 MG tablet; Take 1 tablet (100 mg total) by mouth 2 (two) times daily.  Dispense: 20 tablet; Refill: 0 - predniSONE (DELTASONE) 10 MG tablet; Take 6 tabs on day 1, 5 tabs on day 2, 4 tabs on day 3, 3 tabs on day 4, 2 tabs on day 5, 1 tab on day 6. Then stop.  Dispense: 21 tablet; Refill: 0  2. Cough Prescribed tussionex cough suppressant to take twice daily as needed for cough. Recommended she use at night and when at home only, as this medication can cause significant drowsiness and dizziness. Recommend that she take Mucinex DM during the day to relieve cough without dizziness or drowsiness.  - chlorpheniramine-HYDROcodone (TUSSIONEX PENNKINETIC ER) 10-8 MG/5ML SUER; Take 5 mLs by mouth every 12 (twelve) hours as needed for cough.  Dispense: 115 mL; Refill: 0  3. Attention and concentration deficit Reviewed PDMP profile. Her overdose risk score is 000 and her fill history is appropriate with last Vyvanse fill on 06/25/2020. Sent three new 40 day prescriptions to her pharmacy. Dates are 07/16/2020, 08/13/2020, and 09/11/2020.  - lisdexamfetamine (VYVANSE) 70 MG capsule; Take 1 capsule (70 mg total) by mouth daily.  Dispense: 30 capsule; Refill: 0  Follow Up Instructions:    I discussed the assessment and treatment plan with the patient. The patient was provided an opportunity to ask questions and all were answered. The patient agreed with the plan and demonstrated an understanding of the instructions.   The patient was advised to call back or seek an in-person evaluation if the symptoms worsen or if the  condition fails to improve as anticipated.  I provided 20 minutes of non-face-to-face time during this encounter.   Ronnell Freshwater, NP

## 2020-07-21 ENCOUNTER — Other Ambulatory Visit: Payer: Self-pay

## 2020-07-21 ENCOUNTER — Encounter: Payer: Self-pay | Admitting: Nurse Practitioner

## 2020-07-22 ENCOUNTER — Other Ambulatory Visit: Payer: Self-pay

## 2020-07-28 MED FILL — Hydroxychloroquine Sulfate Tab 200 MG: ORAL | 90 days supply | Qty: 135 | Fill #0 | Status: AC

## 2020-07-29 ENCOUNTER — Other Ambulatory Visit: Payer: Self-pay

## 2020-08-21 ENCOUNTER — Other Ambulatory Visit: Payer: Self-pay

## 2020-08-21 MED FILL — Ibuprofen Tab 600 MG: ORAL | 30 days supply | Qty: 90 | Fill #1 | Status: AC

## 2020-08-25 ENCOUNTER — Other Ambulatory Visit: Payer: Self-pay

## 2020-08-25 ENCOUNTER — Encounter: Payer: Self-pay | Admitting: Nurse Practitioner

## 2020-08-26 ENCOUNTER — Other Ambulatory Visit: Payer: Self-pay

## 2020-08-26 ENCOUNTER — Other Ambulatory Visit: Payer: Self-pay | Admitting: Physician Assistant

## 2020-08-26 DIAGNOSIS — R41840 Attention and concentration deficit: Secondary | ICD-10-CM

## 2020-08-26 MED ORDER — LISDEXAMFETAMINE DIMESYLATE 70 MG PO CAPS
70.0000 mg | ORAL_CAPSULE | Freq: Every day | ORAL | 0 refills | Status: DC
  Filled 2020-08-26: qty 30, 30d supply, fill #0

## 2020-09-18 ENCOUNTER — Ambulatory Visit: Payer: 59 | Admitting: Dermatology

## 2020-09-23 ENCOUNTER — Other Ambulatory Visit: Payer: Self-pay | Admitting: Nurse Practitioner

## 2020-09-23 ENCOUNTER — Encounter: Payer: Self-pay | Admitting: Nurse Practitioner

## 2020-09-23 ENCOUNTER — Other Ambulatory Visit: Payer: Self-pay

## 2020-09-23 DIAGNOSIS — R41840 Attention and concentration deficit: Secondary | ICD-10-CM

## 2020-09-23 MED ORDER — LISDEXAMFETAMINE DIMESYLATE 70 MG PO CAPS
70.0000 mg | ORAL_CAPSULE | Freq: Every day | ORAL | 0 refills | Status: DC
  Filled 2020-09-23: qty 30, 30d supply, fill #0

## 2020-09-23 NOTE — Progress Notes (Signed)
I sent 3rd month vyvanse '70mg'$  daiy prn to [facility]

## 2020-10-03 DIAGNOSIS — G43109 Migraine with aura, not intractable, without status migrainosus: Secondary | ICD-10-CM | POA: Diagnosis not present

## 2020-10-14 ENCOUNTER — Encounter: Payer: Self-pay | Admitting: Nurse Practitioner

## 2020-10-14 ENCOUNTER — Other Ambulatory Visit: Payer: Self-pay

## 2020-10-14 ENCOUNTER — Ambulatory Visit (INDEPENDENT_AMBULATORY_CARE_PROVIDER_SITE_OTHER): Payer: 59 | Admitting: Nurse Practitioner

## 2020-10-14 VITALS — BP 120/76 | HR 84 | Ht 69.0 in | Wt 165.0 lb

## 2020-10-14 DIAGNOSIS — R635 Abnormal weight gain: Secondary | ICD-10-CM | POA: Diagnosis not present

## 2020-10-14 DIAGNOSIS — G43109 Migraine with aura, not intractable, without status migrainosus: Secondary | ICD-10-CM

## 2020-10-14 DIAGNOSIS — R41840 Attention and concentration deficit: Secondary | ICD-10-CM | POA: Diagnosis not present

## 2020-10-14 MED ORDER — LISDEXAMFETAMINE DIMESYLATE 70 MG PO CAPS
70.0000 mg | ORAL_CAPSULE | Freq: Every day | ORAL | 0 refills | Status: DC
  Filled 2020-10-14: qty 30, 30d supply, fill #0

## 2020-10-14 MED ORDER — LISDEXAMFETAMINE DIMESYLATE 70 MG PO CAPS
70.0000 mg | ORAL_CAPSULE | Freq: Every day | ORAL | 0 refills | Status: DC
  Filled 2020-10-14 – 2020-10-23 (×2): qty 30, 30d supply, fill #0

## 2020-10-14 NOTE — Progress Notes (Signed)
Virtual Visit via Telephone Note  I connected with Haley Wilkins on 10/14/20 at  2:10 PM EDT by telephone and verified that I am speaking with the correct person using two identifiers.  Location: Patient: home Provider: [place] [facility] at [facility]     I discussed the limitations, risks, security and privacy concerns of performing an evaluation and management service by telephone and the availability of in person appointments. I also discussed with the patient that there may be a patient responsible charge related to this service. The patient expressed understanding and agreed to proceed.   History of Present Illness: The patient presents for routine follow-up visit.  States that she was recently diagnosed with ocular migraines.  States that she had single episode of "kaleidoscope" vision.  Affected both eyes.  Did not have headache with this presentation.  She did not visit eye doctor.  Confirm diagnosis of ocular migraine.  She has had this only once and has not had further episodes or problems since then. States she is concerned about weight gain over the recent months.  States that she knows this is due to working from home.  She states working from home has given her the ability to help take care of her mother who has become ill over the last year.  Patient states she is taking responsibility for the recent weight gain.  She is setting an alarm to take breaks away from the computer and get up to walk around.  She is taking walks outside during her lunch breaks.  She is trying to incorporate the treadmill into her work desk.  She continues to eat a low calorie diet.  States that she sometimes does not get full like she thinks she should.  Often eats too for sensation of overfull.  Discussed ways to gradually change this habit. She does continue take Vyvanse 70 mg.  In addition to working full-time, she does take care of her recently ill mother.  She also has a teenage son.  She states  that Vyvanse helps to keep her focused and on track while at work without negative side effects.  Her PDMP profile was reviewed today.  Her overdose was score is 190.  Her fill history is appropriate.  She does need refills for her Vyvanse today.   Observations/Objective:  The patient is alert and oriented. She is pleasant and answers all questions appropriately. Breathing is non-labored. She is in no acute distress at this time.    Today's Vitals   10/14/20 1303  BP: 120/76  Pulse: 84  Weight: 165 lb (74.8 kg)  Height: 5\' 9"  (1.753 m)   Body mass index is 24.37 kg/m.   Assessment and Plan: 1. Ocular migraine Single episode of ocular migraine confirmed per ophthalmology.  Patient to keep journal of additional headaches whether they be migraine, ocular migraine, or tension headaches.  We will discuss in further detail at next visit.  2. Attention and concentration deficit Patient doing well with Vyvanse 70 mg capsules.  No negative side effects reported. Three 30-day prescription sent to her pharmacy today.  Dates are 10/14/2020, 11/11/2020, and 12/11/2018. - lisdexamfetamine (VYVANSE) 70 MG capsule; Take 1 capsule (70 mg total) by mouth daily.  Dispense: 30 capsule; Refill: 0  3. Abnormal weight gain Discussed limiting daily calorie intake to 1500 cal/day or less.  Discussed some problems to avoid overeating and decreasing portion sizes at mealtime.  She is gradually incorporating exercise into her daily routine.  We  will continue to monitor.  Follow Up Instructions:  This note was dictated using Systems analyst. Rapid proofreading was performed to expedite the delivery of the information. Despite proofreading, phonetic errors will occur which are common with this voice recognition software. Please take this into consideration. If there are any concerns, please contact our office.     I discussed the assessment and treatment plan with the patient. The patient was  provided an opportunity to ask questions and all were answered. The patient agreed with the plan and demonstrated an understanding of the instructions.   The patient was advised to call back or seek an in-person evaluation if the symptoms worsen or if the condition fails to improve as anticipated.  I provided 10 minutes of non-face-to-face time during this encounter.   Ronnell Freshwater, NP

## 2020-10-23 ENCOUNTER — Other Ambulatory Visit: Payer: Self-pay

## 2020-11-03 ENCOUNTER — Other Ambulatory Visit: Payer: Self-pay | Admitting: Family Medicine

## 2020-11-03 ENCOUNTER — Other Ambulatory Visit: Payer: Self-pay

## 2020-11-03 ENCOUNTER — Encounter: Payer: Self-pay | Admitting: Nurse Practitioner

## 2020-11-03 ENCOUNTER — Other Ambulatory Visit: Payer: Self-pay | Admitting: Nurse Practitioner

## 2020-11-03 DIAGNOSIS — E559 Vitamin D deficiency, unspecified: Secondary | ICD-10-CM

## 2020-11-03 MED FILL — Ergocalciferol Cap 1.25 MG (50000 Unit): ORAL | 28 days supply | Qty: 4 | Fill #0 | Status: AC

## 2020-11-05 ENCOUNTER — Other Ambulatory Visit: Payer: Self-pay

## 2020-11-05 MED FILL — Cyanocobalamin Inj 1000 MCG/ML: INTRAMUSCULAR | 28 days supply | Qty: 4 | Fill #0 | Status: AC

## 2020-11-18 ENCOUNTER — Ambulatory Visit: Payer: 59 | Admitting: Dermatology

## 2020-11-18 ENCOUNTER — Encounter: Payer: Self-pay | Admitting: Dermatology

## 2020-11-18 ENCOUNTER — Other Ambulatory Visit: Payer: Self-pay

## 2020-11-18 DIAGNOSIS — D239 Other benign neoplasm of skin, unspecified: Secondary | ICD-10-CM

## 2020-11-18 DIAGNOSIS — D1801 Hemangioma of skin and subcutaneous tissue: Secondary | ICD-10-CM

## 2020-11-18 DIAGNOSIS — C4491 Basal cell carcinoma of skin, unspecified: Secondary | ICD-10-CM

## 2020-11-18 DIAGNOSIS — D485 Neoplasm of uncertain behavior of skin: Secondary | ICD-10-CM | POA: Diagnosis not present

## 2020-11-18 DIAGNOSIS — Z86018 Personal history of other benign neoplasm: Secondary | ICD-10-CM

## 2020-11-18 DIAGNOSIS — D492 Neoplasm of unspecified behavior of bone, soft tissue, and skin: Secondary | ICD-10-CM

## 2020-11-18 DIAGNOSIS — Z85828 Personal history of other malignant neoplasm of skin: Secondary | ICD-10-CM | POA: Diagnosis not present

## 2020-11-18 DIAGNOSIS — C44219 Basal cell carcinoma of skin of left ear and external auricular canal: Secondary | ICD-10-CM | POA: Diagnosis not present

## 2020-11-18 DIAGNOSIS — Z808 Family history of malignant neoplasm of other organs or systems: Secondary | ICD-10-CM

## 2020-11-18 HISTORY — DX: Basal cell carcinoma of skin, unspecified: C44.91

## 2020-11-18 NOTE — Progress Notes (Signed)
   New Patient Visit  Subjective  Haley Wilkins is a 40 y.o. female who presents for the following: Skin Problem (Check spots on the left leg). Hx of Dysplastic nevus, hx of BCC, hx of SCC  Parents hx of Melanoma   The following portions of the chart were reviewed this encounter and updated as appropriate:   Tobacco  Allergies  Meds  Problems  Med Hx  Surg Hx  Fam Hx      Review of Systems:  No other skin or systemic complaints except as noted in HPI or Assessment and Plan.  Objective  Well appearing patient in no apparent distress; mood and affect are within normal limits.  A focused examination was performed including face,left leg. Relevant physical exam findings are noted in the Assessment and Plan.  left pretibial 0.4 cm dark pink papule with surrounding hyperpigmentation        left helix 1.0 cm eroded pink plaque         Assessment & Plan  Neoplasm of skin (2) left pretibial  Skin / nail biopsy Type of biopsy: tangential   Informed consent: discussed and consent obtained   Patient was prepped and draped in usual sterile fashion: Area prepped with alcohol. Anesthesia: the lesion was anesthetized in a standard fashion   Anesthetic:  1% lidocaine w/ epinephrine 1-100,000 buffered w/ 8.4% NaHCO3 Instrument used: flexible razor blade   Hemostasis achieved with: pressure, aluminum chloride and electrodesiccation   Outcome: patient tolerated procedure well   Post-procedure details: wound care instructions given   Post-procedure details comment:  Ointment and small bandage applied  Specimen 1 - Surgical pathology Differential Diagnosis: R/O traumatized dermatofibroma vs other   Check Margins: No  left helix  Skin / nail biopsy Type of biopsy: tangential   Informed consent: discussed and consent obtained   Patient was prepped and draped in usual sterile fashion: Area prepped with alcohol. Anesthesia: the lesion was anesthetized in a standard fashion    Anesthetic:  1% lidocaine w/ epinephrine 1-100,000 buffered w/ 8.4% NaHCO3 Instrument used: flexible razor blade   Hemostasis achieved with: pressure, aluminum chloride and electrodesiccation   Outcome: patient tolerated procedure well   Post-procedure details: wound care instructions given   Post-procedure details comment:  Ointment and small bandage applied  Specimen 2 - Surgical pathology Differential Diagnosis: R/O BCC   Check Margins: No  Recommend Mohs surgery on the left helix if biopsy confirms skin cancer, we will refer to Dr Manley Mason at [facility] pink/brown papulenodule with dimple sign - Benign appearing - Call for any changes  Hemangiomas - Red papule - Discussed benign nature - Observe - Call for any changes  History of Skin Cancer  - Clear at left medial calf. Observe for recurrence.  - Call clinic for new or changing lesions.   - Recommend regular skin exams, daily broad-spectrum spf 30+ sunscreen use, and photoprotection.     Return for TBSE .   I, Marye Round, CMA, am acting as scribe for Forest Gleason, MD .   Documentation: I have reviewed the above documentation for accuracy and completeness, and I agree with the above.  Forest Gleason, MD

## 2020-11-18 NOTE — Patient Instructions (Addendum)
Wound Care Instructions  Cleanse wound gently with soap and water once a day then pat dry with clean gauze. Apply a thing coat of Petrolatum (petroleum jelly, "Vaseline") over the wound (unless you have an allergy to this). We recommend that you use a new, sterile tube of Vaseline. Do not pick or remove scabs. Do not remove the yellow or white "healing tissue" from the base of the wound.  Cover the wound with fresh, clean, nonstick gauze and secure with paper tape. You may use Band-Aids in place of gauze and tape if the would is small enough, but would recommend trimming much of the tape off as there is often too much. Sometimes Band-Aids can irritate the skin.  You should call the office for your biopsy report after 1 week if you have not already been contacted.  If you experience any problems, such as abnormal amounts of bleeding, swelling, significant bruising, significant pain, or evidence of infection, please call the office immediately.  FOR ADULT SURGERY PATIENTS: If you need something for pain relief you may take 1 extra strength Tylenol (acetaminophen) AND 2 Ibuprofen (200mg each) together every 4 hours as needed for pain. (do not take these if you are allergic to them or if you have a reason you should not take them.) Typically, you may only need pain medication for 1 to 3 days.   If you have any questions or concerns for your doctor, please call our main line at [phone] and press option 4 to reach your doctor's medical assistant. If no one answers, please leave a voicemail as directed and we will return your call as soon as possible. Messages left after 4 pm will be answered the following business day.   You may also send us a message via MyChart. We typically respond to MyChart messages within 1-2 business days.  For prescription refills, please ask your pharmacy to contact our office. Our fax number is [phone].  If you have an urgent issue when the clinic is closed that  cannot wait until the next business day, you can page your doctor at the number below.    Please note that while we do our best to be available for urgent issues outside of office hours, we are not available 24/7.   If you have an urgent issue and are unable to reach us, you may choose to seek medical care at your doctor's office, retail clinic, urgent care center, or emergency room.  If you have a medical emergency, please immediately call 911 or go to the emergency department.  Pager Numbers  - Dr. Kowalski: [phone]  - Dr. Moye: [phone]  - Dr. Stewart: [phone]  In the event of inclement weather, please call our main line at [phone] for an update on the status of any delays or closures.  Dermatology Medication Tips: Please keep the boxes that topical medications come in in order to help keep track of the instructions about where and how to use these. Pharmacies typically print the medication instructions only on the boxes and not directly on the medication tubes.   If your medication is too expensive, please contact our office at [phone] option 4 or send us a message through MyChart.   We are unable to tell what your co-pay for medications will be in advance as this is different depending on your insurance coverage. However, we may be able to find a substitute medication at lower cost or fill out paperwork to get insurance to cover a needed   medication.   If a prior authorization is required to get your medication covered by your insurance company, please allow us 1-2 business days to complete this process.  Drug prices often vary depending on where the prescription is filled and some pharmacies may offer cheaper prices.  The website www.goodrx.com contains coupons for medications through different pharmacies. The prices here do not account for what the cost may be with help from insurance (it may be cheaper with your insurance), but the website can give you the  price if you did not use any insurance.  - You can print the associated coupon and take it with your prescription to the pharmacy.  - You may also stop by our office during regular business hours and pick up a GoodRx coupon card.  - If you need your prescription sent electronically to a different pharmacy, notify our office through [facility] MyChart or by phone at [phone] option 4.   

## 2020-11-20 ENCOUNTER — Other Ambulatory Visit: Payer: Self-pay

## 2020-11-20 ENCOUNTER — Telehealth: Payer: Self-pay

## 2020-11-20 DIAGNOSIS — C44219 Basal cell carcinoma of skin of left ear and external auricular canal: Secondary | ICD-10-CM

## 2020-11-20 NOTE — Telephone Encounter (Signed)
Patient advised bx results, will send referral to [facility] for Moh's./js

## 2020-11-20 NOTE — Telephone Encounter (Signed)
-----   Message from Alfonso Patten, MD sent at 11/20/2020  8:43 AM EDT ----- 1. Skin , left pretibial HEMANGIOMA, BASE INVOLVED --> benign blood vessel growth, no treatment needed  2. Skin , left helix BASAL CELL CARCINOMA, NODULAR PATTERN, BASE INVOLVED --> Mohs at her clinic of choice; let me know if she has anyquestions  Also recommend sooner FBSE  MAs please call. Thank you!

## 2020-11-24 ENCOUNTER — Other Ambulatory Visit: Payer: Self-pay | Admitting: Nurse Practitioner

## 2020-11-24 ENCOUNTER — Other Ambulatory Visit: Payer: Self-pay

## 2020-11-24 DIAGNOSIS — R41840 Attention and concentration deficit: Secondary | ICD-10-CM

## 2020-11-24 MED FILL — Lisdexamfetamine Dimesylate Cap 70 MG: ORAL | 30 days supply | Qty: 30 | Fill #0 | Status: AC

## 2020-11-27 ENCOUNTER — Other Ambulatory Visit: Payer: Self-pay | Admitting: Nurse Practitioner

## 2020-11-27 ENCOUNTER — Other Ambulatory Visit: Payer: Self-pay

## 2020-11-27 DIAGNOSIS — M0579 Rheumatoid arthritis with rheumatoid factor of multiple sites without organ or systems involvement: Secondary | ICD-10-CM

## 2020-11-27 MED ORDER — IBUPROFEN 600 MG PO TABS
ORAL_TABLET | Freq: Three times a day (TID) | ORAL | 2 refills | Status: AC | PRN
  Filled 2020-11-27 – 2020-12-17 (×2): qty 90, 30d supply, fill #0
  Filled 2021-02-12: qty 90, 30d supply, fill #1
  Filled 2021-05-26: qty 90, 30d supply, fill #2

## 2020-12-09 ENCOUNTER — Other Ambulatory Visit: Payer: Self-pay

## 2020-12-09 MED FILL — Cyanocobalamin Inj 1000 MCG/ML: INTRAMUSCULAR | 28 days supply | Qty: 4 | Fill #1 | Status: AC

## 2020-12-09 MED FILL — Ergocalciferol Cap 1.25 MG (50000 Unit): ORAL | 28 days supply | Qty: 4 | Fill #1 | Status: AC

## 2020-12-16 ENCOUNTER — Other Ambulatory Visit: Payer: Self-pay

## 2020-12-17 ENCOUNTER — Other Ambulatory Visit: Payer: Self-pay

## 2020-12-22 ENCOUNTER — Other Ambulatory Visit: Payer: Self-pay | Admitting: Nurse Practitioner

## 2020-12-22 ENCOUNTER — Encounter: Payer: Self-pay | Admitting: Nurse Practitioner

## 2020-12-22 ENCOUNTER — Other Ambulatory Visit: Payer: Self-pay

## 2020-12-22 DIAGNOSIS — R41840 Attention and concentration deficit: Secondary | ICD-10-CM

## 2020-12-22 MED ORDER — LISDEXAMFETAMINE DIMESYLATE 70 MG PO CAPS
ORAL_CAPSULE | ORAL | 0 refills | Status: DC
  Filled 2020-12-22: qty 30, 30d supply, fill #0

## 2021-01-06 ENCOUNTER — Other Ambulatory Visit: Payer: Self-pay

## 2021-01-06 ENCOUNTER — Ambulatory Visit (INDEPENDENT_AMBULATORY_CARE_PROVIDER_SITE_OTHER): Payer: 59 | Admitting: Nurse Practitioner

## 2021-01-06 VITALS — BP 114/69 | HR 72 | Temp 97.0°F | Ht 69.0 in | Wt 170.5 lb

## 2021-01-06 DIAGNOSIS — R41840 Attention and concentration deficit: Secondary | ICD-10-CM | POA: Diagnosis not present

## 2021-01-06 DIAGNOSIS — G43109 Migraine with aura, not intractable, without status migrainosus: Secondary | ICD-10-CM | POA: Diagnosis not present

## 2021-01-06 DIAGNOSIS — E663 Overweight: Secondary | ICD-10-CM | POA: Diagnosis not present

## 2021-01-06 DIAGNOSIS — J0140 Acute pansinusitis, unspecified: Secondary | ICD-10-CM

## 2021-01-06 DIAGNOSIS — M0579 Rheumatoid arthritis with rheumatoid factor of multiple sites without organ or systems involvement: Secondary | ICD-10-CM | POA: Diagnosis not present

## 2021-01-06 MED ORDER — LISDEXAMFETAMINE DIMESYLATE 70 MG PO CAPS
ORAL_CAPSULE | ORAL | 0 refills | Status: DC
  Filled 2021-01-06: qty 30, fill #0

## 2021-01-06 MED ORDER — LISDEXAMFETAMINE DIMESYLATE 70 MG PO CAPS
ORAL_CAPSULE | ORAL | 0 refills | Status: DC
  Filled 2021-01-06 – 2021-01-22 (×2): qty 30, 30d supply, fill #0

## 2021-01-06 NOTE — Progress Notes (Signed)
Established Patient Office Visit  Subjective:  Patient ID: Haley Wilkins, female    DOB: April 12, 1980  Age: 40 y.o. MRN: [id number]  CC:  Chief Complaint  Patient presents with   Medication Refill    Medication follow up    HPI Haley Wilkins presents for routine follow up. She is having some increased personal stress due to mother's illness. 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 Mother is currently I hospital bed and has hoyer lift as she is obese and cannot be lifted. Patient feeling overwhelmed. Hopeless about her mother's health. She is having trouble balancing work and taking care of her mother.  Currently taking vyvanse 41m daily. She continues to do well with this dose of medication. Does need to have refills for this today.  We did review her PDMP profile.  Her overdose was score is 190.  Her fill history is appropriate.  Past Medical History:  Diagnosis Date   Basal cell carcinoma    left medial calf   Basal cell carcinoma 11/18/2020   left helix, Moh's   Dysplastic nevus 03/29/2018   L below braline lat at costal margin   Pelvic pain in female    Raynaud's disease    Rheumatoid arthritis ([place]    Squamous cell carcinoma of skin    left post shoulder   Vaginal Pap smear, abnormal    Vitamin B 12 deficiency     Past Surgical History:  Procedure Laterality Date   CESAREAN SECTION     LEEP  2006   LITHOTRIPSY  2003    Family History  Problem Relation Age of Onset   Diabetes Mother    Diabetes Father    Diabetes Maternal Grandfather     Social History   Socioeconomic History   Marital status: Married    Spouse name: Not on file   Number of children: Not on file   Years of education: Not on file   Highest education level: Not on file  Occupational History   Occupation: team lead billing  Tobacco Use   Smoking status: Never   Smokeless tobacco: Never  Vaping Use   Vaping Use: Never used  Substance and Sexual Activity    Alcohol use: Not Currently   Drug use: No   Sexual activity: Yes    Birth control/protection: I.U.D.    Comment: mirena  Other Topics Concern   Not on file  Social History Narrative   Not on file   Social Determinants of Health   Financial Resource Strain: Not on file  Food Insecurity: Not on file  Transportation Needs: Not on file  Physical Activity: Not on file  Stress: Not on file  Social Connections: Not on file  Intimate Partner Violence: Not on file    Outpatient Medications Prior to Visit  Medication Sig Dispense Refill   cyanocobalamin (,VITAMIN B-12,) 1000 MCG/ML injection INJECT 1 ML INTO THE MUSCLE ONCE A WEEK. 4 mL 5   cyclobenzaprine (FLEXERIL) 5 MG tablet TAKE 1 TABLET BY MOUTH AT BEDTIME AS NEEDED FOR MUSCLE SPASMS. 30 tablet 2   hydroxychloroquine (PLAQUENIL) 200 MG tablet TAKE 300 MG (1.5 TABS) DAILY OR 200 MG (1 TAB) ALTERNATING WITH 400 MG (2 TABS) EVERY OTHER DAY. SANDOZ OR PRASCO MEDICALLY NECESSARY 135 tablet 1   ibuprofen (ADVIL) 600 MG tablet TAKE 1 TABLET BY MOUTH EVERY 8 HOURS AS NEEDED. 90 tablet 2   Insulin Syringe-Needle U-100 32G X 5/16" 1 ML  MISC 1 Units by Does not apply route once a week. 4 each 5   levonorgestrel (MIRENA) 20 MCG/24HR IUD 1 each by Intrauterine route once.     linaclotide (LINZESS) 145 MCG CAPS capsule Take 1 capsule (145 mcg total) by mouth daily before breakfast. 30 capsule 2   omeprazole (PRILOSEC) 40 MG capsule TAKE 1 CAPSULE BY MOUTH TWICE DAILY 60 capsule 3   phenazopyridine (PYRIDIUM) 200 MG tablet Take 1 tablet (200 mg total) by mouth 3 (three) times daily as needed for pain. 10 tablet 0   Vitamin D, Ergocalciferol, (DRISDOL) 1.25 MG (50000 UNIT) CAPS capsule TAKE 1 CAPSULE BY MOUTH EVERY 7 DAYS. 4 capsule 5   chlorpheniramine-HYDROcodone (TUSSIONEX PENNKINETIC ER) 10-8 MG/5ML SUER Take 5 mLs by mouth every 12 (twelve) hours as needed for cough. (Patient not taking: Reported on 10/14/2020) 115 mL 0   doxycycline (VIBRA-TABS)  100 MG tablet Take 1 tablet (100 mg total) by mouth 2 (two) times daily. 20 tablet 0   lisdexamfetamine (VYVANSE) 70 MG capsule Take 1 capsule (70 mg total) by mouth daily. 30 capsule 0   predniSONE (DELTASONE) 10 MG tablet Take 6 tabs on day 1, 5 tabs on day 2, 4 tabs on day 3, 3 tabs on day 4, 2 tabs on day 5, 1 tab on day 6. Then stop. (Patient not taking: Reported on 10/14/2020) 21 tablet 0   No facility-administered medications prior to visit.    No Known Allergies  ROS Review of Systems  Constitutional:  Positive for fatigue. Negative for activity change, appetite change, chills and fever.  HENT:  Negative for congestion, postnasal drip, rhinorrhea, sinus pressure, sinus pain, sneezing and sore throat.   Eyes: Negative.   Respiratory:  Negative for cough, chest tightness, shortness of breath and wheezing.   Cardiovascular:  Negative for chest pain and palpitations.  Gastrointestinal:  Negative for abdominal pain, constipation, diarrhea, nausea and vomiting.  Endocrine: Negative for cold intolerance, heat intolerance, polydipsia and polyuria.  Genitourinary:  Negative for dyspareunia, dysuria, flank pain, frequency and urgency.  Musculoskeletal:  Negative for arthralgias, back pain and myalgias.  Skin:  Negative for rash.  Allergic/Immunologic: Negative for environmental allergies.  Neurological:  Negative for dizziness, weakness and headaches.  Hematological:  Negative for adenopathy.  Psychiatric/Behavioral:  Positive for decreased concentration and sleep disturbance. The patient is nervous/anxious.      Objective:    Physical Exam Vitals and nursing note reviewed.  Constitutional:      Appearance: Normal appearance. She is well-developed.  HENT:     Head: Normocephalic and atraumatic.     Nose: Nose normal.     Mouth/Throat:     Mouth: Mucous membranes are moist.  Eyes:     Extraocular Movements: Extraocular movements intact.     Conjunctiva/sclera: Conjunctivae normal.      Pupils: Pupils are equal, round, and reactive to light.  Cardiovascular:     Rate and Rhythm: Normal rate and regular rhythm.     Pulses: Normal pulses.     Heart sounds: Normal heart sounds.  Pulmonary:     Effort: Pulmonary effort is normal.     Breath sounds: Normal breath sounds.  Abdominal:     Palpations: Abdomen is soft.  Musculoskeletal:        General: Normal range of motion.     Cervical back: Normal range of motion and neck supple.  Lymphadenopathy:     Cervical: No cervical adenopathy.  Skin:    General: Skin  is warm and dry.     Capillary Refill: Capillary refill takes less than 2 seconds.  Neurological:     General: No focal deficit present.     Mental Status: She is alert and oriented to person, place, and time.  Psychiatric:        Attention and Perception: Attention and perception normal.        Mood and Affect: Mood is depressed.        Speech: Speech normal.        Behavior: Behavior normal. Behavior is cooperative.        Thought Content: Thought content normal.        Cognition and Memory: Cognition and memory normal.        Judgment: Judgment normal.    Today's Vitals   01/06/21 1602  BP: 114/69  Pulse: 72  Temp: (!) 97 F (36.1 C)  SpO2: 100%  Weight: 170 lb 8 oz (77.3 kg)  Height: 5' 9"  (1.753 m)   Body mass index is 25.18 kg/m.   Wt Readings from Last 3 Encounters:  01/06/21 170 lb 8 oz (77.3 kg)  10/14/20 165 lb (74.8 kg)  07/16/20 162 lb (73.5 kg)     Health Maintenance Due  Topic Date Due   COVID-19 Vaccine (1) Never done   Pneumococcal Vaccine 28-51 Years old (1 - PCV) Never done   HIV Screening  Never done   TETANUS/TDAP  Never done   INFLUENZA VACCINE  08/25/2020    There are no preventive care reminders to display for this patient.  Lab Results  Component Value Date   TSH 2.200 04/18/2020   Lab Results  Component Value Date   WBC 3.9 04/18/2020   HGB 13.0 04/18/2020   HCT 40.4 04/18/2020   MCV 98 (H)  04/18/2020   PLT 198 04/18/2020   Lab Results  Component Value Date   NA 144 04/18/2020   K 4.3 04/18/2020   CO2 18 (L) 04/18/2020   GLUCOSE 87 04/18/2020   BUN 12 04/18/2020   CREATININE 0.90 04/18/2020   BILITOT 0.9 04/18/2020   ALKPHOS 63 04/18/2020   AST 17 04/18/2020   ALT 10 04/18/2020   PROT 7.3 04/18/2020   ALBUMIN 4.6 04/18/2020   CALCIUM 9.3 04/18/2020   ANIONGAP 5 10/01/2015   EGFR 82 04/18/2020   Lab Results  Component Value Date   CHOL 147 04/21/2017   Lab Results  Component Value Date   HDL 68 04/21/2017   Lab Results  Component Value Date   LDLCALC 71 04/21/2017   Lab Results  Component Value Date   TRIG 38 04/21/2017   Lab Results  Component Value Date   CHOLHDL 2.2 04/21/2017   Lab Results  Component Value Date   HGBA1C 4.8 04/18/2020      Assessment & Plan:  1. Rheumatoid arthritis involving multiple sites with positive rheumatoid factor ([place]) Patient continues to see rheumatology for surveillance.  2. Ocular migraine New onset ocular migraines since having COVID infection.  Only 1 ocular migraine reported.  Will monitor.  3. Attention and concentration deficit Patient doing well with current dose of Vyvanse 70 mg daily. Three consecutive 30-day prescriptions were sent to her pharmacy today.  Dates are 01/06/2021, 02/04/2021, and 03/05/2021. - lisdexamfetamine (VYVANSE) 70 MG capsule; Take 1 capsule (70 mg total) by mouth daily.  Dispense: 30 capsule; Refill: 0  4. Overweight with body mass index (BMI) 25.0-29.9 Discussed lowering calorie intake to 1500 calories per day  and incorporating exercise into daily routine to help lose weight.     Problem List Items Addressed This Visit       Cardiovascular and Mediastinum   Ocular migraine     Musculoskeletal and Integument   Rheumatoid arthritis involving multiple sites with positive rheumatoid factor (HCC) - Primary     Other   Attention and concentration deficit   Relevant Medications    lisdexamfetamine (VYVANSE) 70 MG capsule   Overweight with body mass index (BMI) 25.0-29.9    Meds ordered this encounter  Medications   DISCONTD: lisdexamfetamine (VYVANSE) 70 MG capsule    Sig: Take 1 capsule (70 mg total) by mouth daily.    Dispense:  30 capsule    Refill:  0    Order Specific Question:   Supervising Provider    Answer:   Beatrice Lecher D [[id number]]   DISCONTD: lisdexamfetamine (VYVANSE) 70 MG capsule    Sig: Take 1 capsule (70 mg total) by mouth daily.    Dispense:  30 capsule    Refill:  0    Fill on or after 02/04/2021    Order Specific Question:   Supervising Provider    Answer:   Beatrice Lecher D [[id number]]   lisdexamfetamine (VYVANSE) 70 MG capsule    Sig: Take 1 capsule (70 mg total) by mouth daily.    Dispense:  30 capsule    Refill:  0    Fill on or after 03/05/2021    Order Specific Question:   Supervising Provider    Answer:   Beatrice Lecher D [[id number]]     Follow-up: Return in about 3 months (around 04/06/2021) for mood, ADD.    Ronnell Freshwater, NP  This note was dictated using Systems analyst. Rapid proofreading was performed to expedite the delivery of the information. Despite proofreading, phonetic errors will occur which are common with this voice recognition software. Please take this into consideration. If there are any concerns, please contact our office.

## 2021-01-08 ENCOUNTER — Ambulatory Visit: Payer: 59 | Admitting: Dermatology

## 2021-01-13 ENCOUNTER — Other Ambulatory Visit: Payer: Self-pay

## 2021-01-13 MED ORDER — HYDROXYCHLOROQUINE SULFATE 200 MG PO TABS
ORAL_TABLET | ORAL | 1 refills | Status: DC
  Filled 2021-01-13: qty 135, 90d supply, fill #0
  Filled 2021-11-05: qty 135, 90d supply, fill #1

## 2021-01-19 ENCOUNTER — Encounter: Payer: Self-pay | Admitting: Nurse Practitioner

## 2021-01-19 DIAGNOSIS — E663 Overweight: Secondary | ICD-10-CM | POA: Insufficient documentation

## 2021-01-19 NOTE — Patient Instructions (Signed)
Fat and Cholesterol Restricted Eating Plan Getting too much fat and cholesterol in your diet may cause health problems. Choosing the right foods helps keep your fat and cholesterol at normal levels. This can keep you from getting certain diseases. Your doctor may recommend an eating plan that includes: Total fat: ______% or less of total calories a day. This is ______g of fat a day. Saturated fat: ______% or less of total calories a day. This is ______g of saturated fat a day. Cholesterol: less than _________mg a day. Fiber: ______g a day. What are tips for following this plan? General tips Work with your doctor to lose weight if you need to. Avoid: Foods with added sugar. Fried foods. Foods with trans fat or partially hydrogenated oils. This includes some margarines and baked goods. If you drink alcohol: Limit how much you have to: 0-1 drink a day for women who are not pregnant. 0-2 drinks a day for men. Know how much alcohol is in a drink. In the [place] one drink equals one 12 oz bottle of beer (355 mL), one 5 oz glass of wine (148 mL), or one 1 oz glass of hard liquor (44 mL). Reading food labels Check food labels for: Trans fats. Partially hydrogenated oils. Saturated fat (g) in each serving. Cholesterol (mg) in each serving. Fiber (g) in each serving. Choose foods with healthy fats, such as: Monounsaturated fats and polyunsaturated fats. These include olive and canola oil, flaxseeds, walnuts, almonds, and seeds. Omega-3 fats. These are found in certain fish, flaxseed oil, and ground flaxseeds. Choose grain products that have whole grains. Look for the word "whole" as the first word in the ingredient list. Cooking Cook foods using low-fat methods. These include baking, boiling, grilling, and broiling. Eat more home-cooked foods. Eat at restaurants and buffets less often. Eat less fast food. Avoid cooking using saturated fats, such as butter, cream, palm oil, palm kernel oil, and  coconut oil. Meal planning  At meals, divide your plate into four equal parts: Fill one-half of your plate with vegetables, green salads, and fruit. Fill one-fourth of your plate with whole grains. Fill one-fourth of your plate with low-fat (lean) protein foods. Eat fish that is high in omega-3 fats at least two times a week. This includes mackerel, tuna, sardines, and salmon. Eat foods that are high in fiber, such as whole grains, beans, apples, pears, berries, broccoli, carrots, peas, and barley. What foods should I eat? Fruits All fresh, canned (in natural juice), or frozen fruits. Vegetables Fresh or frozen vegetables (raw, steamed, roasted, or grilled). Green salads. Grains Whole grains, such as whole wheat or whole grain breads, crackers, cereals, and pasta. Unsweetened oatmeal, bulgur, barley, quinoa, or brown rice. Corn or whole wheat flour tortillas. Meats and other protein foods Ground beef (85% or leaner), grass-fed beef, or beef trimmed of fat. Skinless chicken or turkey. Ground chicken or turkey. Pork trimmed of fat. All fish and seafood. Egg whites. Dried beans, peas, or lentils. Unsalted nuts or seeds. Unsalted canned beans. Nut butters without added sugar or oil. Dairy Low-fat or nonfat dairy products, such as skim or 1% milk, 2% or reduced-fat cheeses, low-fat and fat-free ricotta or cottage cheese, or plain low-fat and nonfat yogurt. Fats and oils Tub margarine without trans fats. Light or reduced-fat mayonnaise and salad dressings. Avocado. Olive, canola, sesame, or safflower oils. The items listed above may not be a complete list of foods and beverages you can eat. Contact a dietitian for more information. What foods   should I avoid? Fruits Canned fruit in heavy syrup. Fruit in cream or butter sauce. Fried fruit. Vegetables Vegetables cooked in cheese, cream, or butter sauce. Fried vegetables. Grains White bread. White pasta. White rice. Cornbread. Bagels, pastries,  and croissants. Crackers and snack foods that contain trans fat and hydrogenated oils. Meats and other protein foods Fatty cuts of meat. Ribs, chicken wings, bacon, sausage, bologna, salami, chitterlings, fatback, hot dogs, bratwurst, and packaged lunch meats. Liver and organ meats. Whole eggs and egg yolks. Chicken and turkey with skin. Fried meat. Dairy Whole or 2% milk, cream, half-and-half, and cream cheese. Whole milk cheeses. Whole-fat or sweetened yogurt. Full-fat cheeses. Nondairy creamers and whipped toppings. Processed cheese, cheese spreads, and cheese curds. Fats and oils Butter, stick margarine, lard, shortening, ghee, or bacon fat. Coconut, palm kernel, and palm oils. Beverages Alcohol. Sugar-sweetened drinks such as sodas, lemonade, and fruit drinks. Sweets and desserts Corn syrup, sugars, honey, and molasses. Candy. Jam and jelly. Syrup. Sweetened cereals. Cookies, pies, cakes, donuts, muffins, and ice cream. The items listed above may not be a complete list of foods and beverages you should avoid. Contact a dietitian for more information. Summary Choosing the right foods helps keep your fat and cholesterol at normal levels. This can keep you from getting certain diseases. At meals, fill one-half of your plate with vegetables, green salads, and fruits. Eat high fiber foods, like whole grains, beans, apples, pears, berries, carrots, peas, and barley. Limit added sugar, saturated fats, alcohol, and fried foods. This information is not intended to replace advice given to you by your health care provider. Make sure you discuss any questions you have with your health care provider. Document Revised: 05/23/2020 Document Reviewed: 05/23/2020 Elsevier Patient Education  2022 Elsevier Inc.  

## 2021-01-20 ENCOUNTER — Other Ambulatory Visit: Payer: Self-pay

## 2021-01-20 MED FILL — Ergocalciferol Cap 1.25 MG (50000 Unit): ORAL | 28 days supply | Qty: 4 | Fill #2 | Status: AC

## 2021-01-22 ENCOUNTER — Other Ambulatory Visit: Payer: Self-pay

## 2021-01-27 ENCOUNTER — Ambulatory Visit (INDEPENDENT_AMBULATORY_CARE_PROVIDER_SITE_OTHER): Payer: 59 | Admitting: Certified Nurse Midwife

## 2021-01-27 ENCOUNTER — Other Ambulatory Visit (HOSPITAL_COMMUNITY)
Admission: RE | Admit: 2021-01-27 | Discharge: 2021-01-27 | Disposition: A | Discharge status: Home / Self Care | Payer: 59 | Source: Ambulatory Visit | Attending: Certified Nurse Midwife | Admitting: Certified Nurse Midwife

## 2021-01-27 ENCOUNTER — Other Ambulatory Visit: Payer: Self-pay

## 2021-01-27 ENCOUNTER — Encounter: Payer: Self-pay | Admitting: Certified Nurse Midwife

## 2021-01-27 VITALS — BP 122/77 | HR 75 | Ht 69.0 in | Wt 168.5 lb

## 2021-01-27 DIAGNOSIS — Z124 Encounter for screening for malignant neoplasm of cervix: Secondary | ICD-10-CM

## 2021-01-27 DIAGNOSIS — Z01419 Encounter for gynecological examination (general) (routine) without abnormal findings: Secondary | ICD-10-CM | POA: Insufficient documentation

## 2021-01-27 DIAGNOSIS — Z1231 Encounter for screening mammogram for malignant neoplasm of breast: Secondary | ICD-10-CM | POA: Diagnosis not present

## 2021-01-27 DIAGNOSIS — R8290 Unspecified abnormal findings in urine: Secondary | ICD-10-CM | POA: Diagnosis not present

## 2021-01-27 LAB — POCT URINALYSIS DIPSTICK
Bilirubin, UA: NEGATIVE
Glucose, UA: NEGATIVE
Ketones, UA: NEGATIVE
Leukocytes, UA: NEGATIVE
Nitrite, UA: NEGATIVE
Protein, UA: NEGATIVE
Spec Grav, UA: 1.01 (ref 1.010–1.025)
Urobilinogen, UA: 0.2 E.U./dL
pH, UA: 6.5 (ref 5.0–8.0)

## 2021-01-27 NOTE — Progress Notes (Signed)
GYNECOLOGY ANNUAL PREVENTATIVE CARE ENCOUNTER NOTE  History:     Haley Wilkins is a 41 y.o. G1P0 female here for a routine annual gynecologic exam.  Current complaints: urinary odor.   Denies abnormal vaginal bleeding, discharge, pelvic pain, problems with intercourse or other gynecologic concerns.     Social Relationship: married  Living:spouse   Work: [employer] Exercise: treadmill walking 30 min nightly  Smoke/Alcohol/drug use: denies use   Gynecologic History No LMP recorded (lmp unknown). (Menstrual status: IUD). Contraception: IUD, has appointment next week for replacement dicussed checking with insurance for coverage since it is good for 2 more yrs.  Last Pap: 11/01/2018. Results were: abnormal with positive HPV ( pt did not have colposcopy) Last mammogram: has not had .    Upstream - 01/27/21 1431       Pregnancy Intention Screening   Does the patient want to become pregnant in the next year? No    Does the patient's partner want to become pregnant in the next year? No    Would the patient like to discuss contraceptive options today? No            The pregnancy intention screening data noted above was reviewed. Potential methods of contraception were discussed. The patient elected to proceed with IUD.   Obstetric History OB History  Gravida Para Term Preterm AB Living  1            SAB IAB Ectopic Multiple Live Births               # Outcome Date GA Lbr Len/2nd Weight Sex Delivery Anes PTL Lv  1 Gravida 2005     CS-Unspec       Past Medical History:  Diagnosis Date   Basal cell carcinoma    left medial calf   Basal cell carcinoma 11/18/2020   left helix, Moh's   Dysplastic nevus 03/29/2018   L below braline lat at costal margin   Pelvic pain in female    Raynaud's disease    Rheumatoid arthritis ([place])    Squamous cell carcinoma of skin    left post shoulder   Vaginal Pap smear, abnormal    Vitamin B 12 deficiency     Past Surgical  History:  Procedure Laterality Date   CESAREAN SECTION     LEEP  2006   LITHOTRIPSY  2003    Current Outpatient Medications on File Prior to Visit  Medication Sig Dispense Refill   cyanocobalamin (,VITAMIN B-12,) 1000 MCG/ML injection INJECT 1 ML INTO THE MUSCLE ONCE A WEEK. 4 mL 5   hydroxychloroquine (PLAQUENIL) 200 MG tablet TAKE 300 MG (1.5 TABS) DAILY OR 200 MG (1 TAB) ALTERNATING WITH 400 MG (2 TABS) EVERY OTHER DAY. SANDOZ OR PRASCO MEDICALLY NECESSARY 135 tablet 1   hydroxychloroquine (PLAQUENIL) 200 MG tablet Take 300 mg (1.5 tabs) daily or 200 mg (1 tab) alternating with 400 mg (2 tabs) every other day. Sandoz or Prasco generic pharmaceuticals medically necessary 135 tablet 1   ibuprofen (ADVIL) 600 MG tablet TAKE 1 TABLET BY MOUTH EVERY 8 HOURS AS NEEDED. 90 tablet 2   Insulin Syringe-Needle U-100 32G X 5/16" 1 ML MISC 1 Units by Does not apply route once a week. 4 each 5   levonorgestrel (MIRENA) 20 MCG/24HR IUD 1 each by Intrauterine route once.     linaclotide (LINZESS) 145 MCG CAPS capsule Take 1 capsule (145 mcg total) by mouth daily before breakfast. 30 capsule  2   lisdexamfetamine (VYVANSE) 70 MG capsule Take 1 capsule (70 mg total) by mouth daily. 30 capsule 0   phenazopyridine (PYRIDIUM) 200 MG tablet Take 1 tablet (200 mg total) by mouth 3 (three) times daily as needed for pain. 10 tablet 0   Vitamin D, Ergocalciferol, (DRISDOL) 1.25 MG (50000 UNIT) CAPS capsule TAKE 1 CAPSULE BY MOUTH EVERY 7 DAYS. 4 capsule 5   omeprazole (PRILOSEC) 40 MG capsule TAKE 1 CAPSULE BY MOUTH TWICE DAILY 60 capsule 3   No current facility-administered medications on file prior to visit.    No Known Allergies  Social History:  reports that she has never smoked. She has never used smokeless tobacco. She reports that she does not currently use alcohol. She reports that she does not use drugs.  Family History  Problem Relation Age of Onset   Diabetes Mother    Diabetes Father    Diabetes  Maternal Grandfather     The following portions of the patient's history were reviewed and updated as appropriate: allergies, current medications, past family history, past medical history, past social history, past surgical history and problem list.  Review of Systems Pertinent items noted in HPI and remainder of comprehensive ROS otherwise negative.  Physical Exam:  BP 122/77    Pulse 75    Ht 5\' 9"  (1.753 m)    Wt 168 lb 8 oz (76.4 kg)    LMP  (LMP Unknown)    BMI 24.88 kg/m  CONSTITUTIONAL: Well-developed, well-nourished female in no acute distress.  HENT:  Normocephalic, atraumatic, External right and left ear normal. Oropharynx is clear and moist EYES: Conjunctivae and EOM are normal. Pupils are equal, round, and reactive to light. No scleral icterus.  NECK: Normal range of motion, supple, no masses.  Normal thyroid.  SKIN: Skin is warm and dry. No rash noted. Not diaphoretic. No erythema. No pallor. MUSCULOSKELETAL: Normal range of motion. No tenderness.  No cyanosis, clubbing, or edema.  2+ distal pulses. NEUROLOGIC: Alert and oriented to person, place, and time. Normal reflexes, muscle tone coordination.  PSYCHIATRIC: Normal mood and affect. Normal behavior. Normal judgment and thought content. CARDIOVASCULAR: Normal heart rate noted, regular rhythm RESPIRATORY: Clear to auscultation bilaterally. Effort and breath sounds normal, no problems with respiration noted. BREASTS: Symmetric in size. No masses, tenderness, skin changes, nipple drainage, or lymphadenopathy bilaterally.  ABDOMEN: Soft, no distention noted.  No tenderness, rebound or guarding.  PELVIC: Normal appearing external genitalia and urethral meatus; normal appearing vaginal mucosa and cervix.  No abnormal discharge noted.  Pap smear obtained.  Normal uterine size, no other palpable masses, no uterine or adnexal tenderness.  .   Assessment and Plan:    1. Well woman exam with routine gynecological exam     Pap:  Will follow up results of pap smear and manage accordingly. Mammogram : ordered Labs: urine culture -odor Refills: none  Referral: none  Routine preventative health maintenance measures emphasized. Please refer to After Visit Summary for other counseling recommendations.      Philip Aspen, CNM [facility]

## 2021-01-29 LAB — URINE CULTURE: Organism ID, Bacteria: NO GROWTH

## 2021-02-03 ENCOUNTER — Ambulatory Visit (INDEPENDENT_AMBULATORY_CARE_PROVIDER_SITE_OTHER): Payer: 59 | Admitting: Certified Nurse Midwife

## 2021-02-03 ENCOUNTER — Other Ambulatory Visit: Payer: Self-pay

## 2021-02-03 VITALS — BP 108/70 | HR 75 | Ht 69.0 in | Wt 170.6 lb

## 2021-02-03 DIAGNOSIS — Z30433 Encounter for removal and reinsertion of intrauterine contraceptive device: Secondary | ICD-10-CM

## 2021-02-03 LAB — CYTOLOGY - PAP: Diagnosis: NEGATIVE

## 2021-02-03 NOTE — Patient Instructions (Signed)

## 2021-02-03 NOTE — Progress Notes (Signed)
° ° °  GYNECOLOGY OFFICE PROCEDURE NOTE  Haley Wilkins is a 41 y.o. G1P0 here for mirena IUD removal and reinsertion. No GYN concerns.  Last pap smear was on 01/27/2021 and was normal.  IUD Removal and Reinsertion  Patient identified, informed consent performed, consent signed.   Discussed risks of irregular bleeding, cramping, infection, malpositioning or misplacement of the IUD outside the uterus which may require further procedures. Also discussed >99% contraception efficacy, increased risk of ectopic pregnancy with failure of method.   Emphasized that this did not protect against STIs, condoms recommended during all sexual encounters.Advised to use backup contraception for one week as the risk of pregnancy is higher during the transition period of removing an IUD and replacing it with another one. Time out was performed. Speculum placed in the vagina. The strings of the IUD were grasped and pulled using ring forceps. The IUD was successfully removed in its entirety. The cervix was cleaned with Betadine x 2 and grasped anteriorly with a single tooth tenaculum.  The new mirena IUD insertion apparatus was used to sound the uterus to 6 cm;  the IUD was then placed per manufacturer's recommendations. Strings trimmed to 3 cm. Tenaculum was removed, good hemostasis noted. Patient tolerated procedure well.   Patient was given post-procedure instructions.   Patient was also asked to check IUD strings periodically and follow up in 4 weeks for IUD check.  Deneise Lever Luverne Farone,CNM

## 2021-02-09 ENCOUNTER — Other Ambulatory Visit: Payer: Self-pay

## 2021-02-09 ENCOUNTER — Other Ambulatory Visit: Payer: Self-pay | Admitting: Nurse Practitioner

## 2021-02-09 DIAGNOSIS — M7918 Myalgia, other site: Secondary | ICD-10-CM

## 2021-02-09 MED ORDER — CYCLOBENZAPRINE HCL 5 MG PO TABS
ORAL_TABLET | ORAL | 2 refills | Status: AC
  Filled 2021-02-09: qty 30, 30d supply, fill #0
  Filled 2021-09-25: qty 30, 30d supply, fill #1

## 2021-02-12 ENCOUNTER — Other Ambulatory Visit: Payer: Self-pay

## 2021-02-23 ENCOUNTER — Other Ambulatory Visit: Payer: Self-pay | Admitting: Nurse Practitioner

## 2021-02-23 ENCOUNTER — Other Ambulatory Visit: Payer: Self-pay

## 2021-02-23 ENCOUNTER — Encounter: Payer: Self-pay | Admitting: Nurse Practitioner

## 2021-02-23 DIAGNOSIS — R41840 Attention and concentration deficit: Secondary | ICD-10-CM

## 2021-02-23 MED ORDER — LISDEXAMFETAMINE DIMESYLATE 70 MG PO CAPS
ORAL_CAPSULE | ORAL | 0 refills | Status: DC
  Filled 2021-02-23: qty 30, 30d supply, fill #0

## 2021-03-03 ENCOUNTER — Encounter: Payer: 59 | Admitting: Certified Nurse Midwife

## 2021-03-06 ENCOUNTER — Other Ambulatory Visit: Payer: Self-pay

## 2021-03-06 MED FILL — Cyanocobalamin Inj 1000 MCG/ML: INTRAMUSCULAR | 28 days supply | Qty: 4 | Fill #2 | Status: AC

## 2021-03-06 MED FILL — Ergocalciferol Cap 1.25 MG (50000 Unit): ORAL | 28 days supply | Qty: 4 | Fill #3 | Status: AC

## 2021-03-25 ENCOUNTER — Other Ambulatory Visit: Payer: Self-pay | Admitting: Nurse Practitioner

## 2021-03-25 ENCOUNTER — Other Ambulatory Visit: Payer: Self-pay

## 2021-03-25 DIAGNOSIS — R41840 Attention and concentration deficit: Secondary | ICD-10-CM

## 2021-03-25 MED ORDER — LISDEXAMFETAMINE DIMESYLATE 70 MG PO CAPS
ORAL_CAPSULE | ORAL | 0 refills | Status: DC
  Filled 2021-03-25: qty 30, 30d supply, fill #0

## 2021-04-06 ENCOUNTER — Other Ambulatory Visit: Payer: Self-pay

## 2021-04-06 ENCOUNTER — Ambulatory Visit: Payer: 59 | Admitting: Nurse Practitioner

## 2021-04-06 ENCOUNTER — Encounter: Payer: Self-pay | Admitting: Nurse Practitioner

## 2021-04-06 VITALS — BP 124/70 | HR 77 | Temp 98.2°F | Ht 68.9 in | Wt 167.1 lb

## 2021-04-06 DIAGNOSIS — K219 Gastro-esophageal reflux disease without esophagitis: Secondary | ICD-10-CM

## 2021-04-06 DIAGNOSIS — F439 Reaction to severe stress, unspecified: Secondary | ICD-10-CM

## 2021-04-06 DIAGNOSIS — R41840 Attention and concentration deficit: Secondary | ICD-10-CM

## 2021-04-06 MED ORDER — LISDEXAMFETAMINE DIMESYLATE 70 MG PO CAPS
ORAL_CAPSULE | ORAL | 0 refills | Status: DC
  Filled 2021-04-06: qty 30, fill #0

## 2021-04-06 MED ORDER — OMEPRAZOLE 40 MG PO CPDR
40.0000 mg | DELAYED_RELEASE_CAPSULE | Freq: Two times a day (BID) | ORAL | 3 refills | Status: DC
  Filled 2021-04-06 – 2021-07-01 (×2): qty 180, 90d supply, fill #0
  Filled 2021-09-25: qty 180, 90d supply, fill #1

## 2021-04-06 NOTE — Progress Notes (Unsigned)
Established patient visit   Patient: Haley Wilkins   DOB: 11-22-1980   41 y.o. Female  MRN: [id number] Visit Date: 04/06/2021   Chief Complaint  Patient presents with   Follow-up   Subjective    HPI  The patient is here for follow up visit.  -   Medications: Outpatient Medications Prior to Visit  Medication Sig   cyanocobalamin (,VITAMIN B-12,) 1000 MCG/ML injection INJECT 1 ML INTO THE MUSCLE ONCE A WEEK.   cyclobenzaprine (FLEXERIL) 5 MG tablet TAKE 1 TABLET BY MOUTH AT BEDTIME AS NEEDED FOR MUSCLE SPASMS.   hydroxychloroquine (PLAQUENIL) 200 MG tablet Take 300 mg (1.5 tabs) daily or 200 mg (1 tab) alternating with 400 mg (2 tabs) every other day. Sandoz or Prasco generic pharmaceuticals medically necessary   ibuprofen (ADVIL) 600 MG tablet TAKE 1 TABLET BY MOUTH EVERY 8 HOURS AS NEEDED.   levonorgestrel (MIRENA) 20 MCG/24HR IUD 1 each by Intrauterine route once.   linaclotide (LINZESS) 145 MCG CAPS capsule Take 1 capsule (145 mcg total) by mouth daily before breakfast.   lisdexamfetamine (VYVANSE) 70 MG capsule Take 1 capsule (70 mg total) by mouth daily.   phenazopyridine (PYRIDIUM) 200 MG tablet Take 1 tablet (200 mg total) by mouth 3 (three) times daily as needed for pain.   Vitamin D, Ergocalciferol, (DRISDOL) 1.25 MG (50000 UNIT) CAPS capsule TAKE 1 CAPSULE BY MOUTH EVERY 7 DAYS.   omeprazole (PRILOSEC) 40 MG capsule TAKE 1 CAPSULE BY MOUTH TWICE DAILY   [DISCONTINUED] Insulin Syringe-Needle U-100 32G X 5/16" 1 ML MISC 1 Units by Does not apply route once a week.   No facility-administered medications prior to visit.    Review of Systems  {Labs (Optional):23779}   Objective    BP 124/70    Pulse 77    Temp 98.2 F (36.8 C)    Ht 5' 8.9" (1.75 m)    Wt 167 lb 1.6 oz (75.8 kg)    SpO2 100%    BMI 24.75 kg/m  BP Readings from Last 3 Encounters:  04/06/21 124/70  02/03/21 108/70  01/27/21 122/77    Wt Readings from Last 3 Encounters:  04/06/21 167 lb 1.6 oz (75.8  kg)  02/03/21 170 lb 9.6 oz (77.4 kg)  01/27/21 168 lb 8 oz (76.4 kg)    Physical Exam  ***  No results found for any visits on 04/06/21.  Assessment & Plan     Problem List Items Addressed This Visit   None    No follow-ups on file.         Ronnell Freshwater, NP  [facility] at [facility] [phone] (phone) [phone] (fax)  [place]

## 2021-04-09 ENCOUNTER — Telehealth: Payer: Self-pay

## 2021-04-09 NOTE — Telephone Encounter (Signed)
Called Dr. Dagoberto Ligas office about referral sent over for patient to treat bcc at left helix. Nurse reports patient had an appointment in December and canceled. Rescheduled to January and then canceled. Did not scheduled a follow up appointment.  ? ?Patient has a future appointment with Dr. Laurence Ferrari 07/29/21. ? ? ?

## 2021-04-15 ENCOUNTER — Other Ambulatory Visit: Payer: Self-pay

## 2021-04-15 DIAGNOSIS — F439 Reaction to severe stress, unspecified: Secondary | ICD-10-CM | POA: Insufficient documentation

## 2021-04-15 MED ORDER — LISDEXAMFETAMINE DIMESYLATE 70 MG PO CAPS
ORAL_CAPSULE | ORAL | 0 refills | Status: DC
  Filled 2021-04-15: qty 30, fill #0

## 2021-04-15 MED ORDER — LISDEXAMFETAMINE DIMESYLATE 70 MG PO CAPS
ORAL_CAPSULE | ORAL | 0 refills | Status: DC
  Filled 2021-04-15: qty 30, fill #0
  Filled 2021-04-23 – 2021-04-24 (×2): qty 30, 30d supply, fill #0

## 2021-04-21 ENCOUNTER — Other Ambulatory Visit: Payer: Self-pay

## 2021-04-21 MED FILL — Ergocalciferol Cap 1.25 MG (50000 Unit): ORAL | 28 days supply | Qty: 4 | Fill #4 | Status: AC

## 2021-04-23 ENCOUNTER — Other Ambulatory Visit: Payer: Self-pay

## 2021-04-23 ENCOUNTER — Other Ambulatory Visit: Payer: Self-pay | Admitting: Nurse Practitioner

## 2021-04-23 DIAGNOSIS — E538 Deficiency of other specified B group vitamins: Secondary | ICD-10-CM

## 2021-04-23 MED FILL — Cyanocobalamin Inj 1000 MCG/ML: INTRAMUSCULAR | 28 days supply | Qty: 4 | Fill #0 | Status: AC

## 2021-04-24 ENCOUNTER — Other Ambulatory Visit: Payer: Self-pay

## 2021-05-13 ENCOUNTER — Encounter: Payer: Self-pay | Admitting: Nurse Practitioner

## 2021-05-21 NOTE — Telephone Encounter (Signed)
Forms have been completed and faxed already and patient is about to be called about them  ?

## 2021-05-22 ENCOUNTER — Other Ambulatory Visit: Payer: Self-pay

## 2021-05-22 ENCOUNTER — Other Ambulatory Visit: Payer: Self-pay | Admitting: Nurse Practitioner

## 2021-05-22 DIAGNOSIS — R41840 Attention and concentration deficit: Secondary | ICD-10-CM

## 2021-05-25 ENCOUNTER — Encounter: Payer: Self-pay | Admitting: Nurse Practitioner

## 2021-05-25 ENCOUNTER — Other Ambulatory Visit: Payer: Self-pay | Admitting: Nurse Practitioner

## 2021-05-25 ENCOUNTER — Other Ambulatory Visit: Payer: Self-pay

## 2021-05-25 DIAGNOSIS — R41840 Attention and concentration deficit: Secondary | ICD-10-CM

## 2021-05-25 MED ORDER — LISDEXAMFETAMINE DIMESYLATE 70 MG PO CAPS
ORAL_CAPSULE | ORAL | 0 refills | Status: DC
  Filled 2021-05-25: qty 30, 30d supply, fill #0

## 2021-05-25 MED FILL — Ergocalciferol Cap 1.25 MG (50000 Unit): ORAL | 28 days supply | Qty: 4 | Fill #5 | Status: AC

## 2021-05-25 MED FILL — Cyanocobalamin Inj 1000 MCG/ML: INTRAMUSCULAR | 28 days supply | Qty: 4 | Fill #1 | Status: AC

## 2021-05-25 NOTE — Telephone Encounter (Signed)
Reviewed PDMP profile with appropriate fill history.  ?

## 2021-05-26 ENCOUNTER — Other Ambulatory Visit: Payer: Self-pay

## 2021-06-16 ENCOUNTER — Other Ambulatory Visit: Payer: Self-pay

## 2021-06-24 ENCOUNTER — Other Ambulatory Visit: Payer: Self-pay

## 2021-06-24 ENCOUNTER — Other Ambulatory Visit: Payer: Self-pay | Admitting: Nurse Practitioner

## 2021-06-24 DIAGNOSIS — R41840 Attention and concentration deficit: Secondary | ICD-10-CM

## 2021-06-24 MED ORDER — LISDEXAMFETAMINE DIMESYLATE 70 MG PO CAPS
ORAL_CAPSULE | ORAL | 0 refills | Status: DC
  Filled 2021-06-24: qty 30, 30d supply, fill #0

## 2021-06-25 ENCOUNTER — Other Ambulatory Visit: Payer: Self-pay

## 2021-06-30 ENCOUNTER — Ambulatory Visit (INDEPENDENT_AMBULATORY_CARE_PROVIDER_SITE_OTHER): Payer: 59 | Admitting: Nurse Practitioner

## 2021-06-30 ENCOUNTER — Encounter: Payer: Self-pay | Admitting: Nurse Practitioner

## 2021-06-30 ENCOUNTER — Other Ambulatory Visit: Payer: Self-pay

## 2021-06-30 VITALS — BP 113/70 | HR 83 | Temp 97.4°F | Ht 68.9 in | Wt 166.9 lb

## 2021-06-30 DIAGNOSIS — E538 Deficiency of other specified B group vitamins: Secondary | ICD-10-CM

## 2021-06-30 DIAGNOSIS — R5383 Other fatigue: Secondary | ICD-10-CM

## 2021-06-30 DIAGNOSIS — E559 Vitamin D deficiency, unspecified: Secondary | ICD-10-CM | POA: Diagnosis not present

## 2021-06-30 DIAGNOSIS — N390 Urinary tract infection, site not specified: Secondary | ICD-10-CM | POA: Diagnosis not present

## 2021-06-30 DIAGNOSIS — R41840 Attention and concentration deficit: Secondary | ICD-10-CM

## 2021-06-30 DIAGNOSIS — Z0000 Encounter for general adult medical examination without abnormal findings: Secondary | ICD-10-CM

## 2021-06-30 DIAGNOSIS — R319 Hematuria, unspecified: Secondary | ICD-10-CM

## 2021-06-30 LAB — POCT URINALYSIS DIP (CLINITEK)
Bilirubin, UA: NEGATIVE
Glucose, UA: NEGATIVE mg/dL
Ketones, POC UA: NEGATIVE mg/dL
Leukocytes, UA: NEGATIVE
Nitrite, UA: NEGATIVE
POC PROTEIN,UA: NEGATIVE
Spec Grav, UA: >=1.03 — AB (ref 1.010–1.025)
Urobilinogen, UA: 0.2 E.U./dL
pH, UA: 5.5 (ref 5.0–8.0)

## 2021-06-30 MED ORDER — LISDEXAMFETAMINE DIMESYLATE 70 MG PO CAPS: ORAL_CAPSULE | ORAL | 0 refills | Status: DC

## 2021-06-30 MED ORDER — NITROFURANTOIN MONOHYD MACRO 100 MG PO CAPS
100.0000 mg | ORAL_CAPSULE | Freq: Two times a day (BID) | ORAL | 0 refills | Status: DC
  Filled 2021-06-30: qty 10, 5d supply, fill #0

## 2021-06-30 NOTE — Progress Notes (Signed)
Established patient visit   Patient: Haley Wilkins   DOB: Jun 07, 1980   41 y.o. Female  MRN: [id number] Visit Date: 06/30/2021   Chief Complaint  Patient presents with   Follow-up   Subjective    HPI  Routine follow up. -Undergoing increased personal and family stress.  Mother recently passed away after a long battle with chronic illness.  Patient to see grief counselor in near future. -Has returned to work.  Finds that routine keeps further thoughts in the best place. -Does take Vyvanse 70 mg daily to help keep her focused and on track while at work.  She does take holidays from this medication on weekends and days off.  She does need new prescriptions for this today. -Reports dysuria and urinary frequency for last 2 days.  Denies nausea, vomiting, or abdominal pain.  Denies fever. -No new concerns or complaints today.  Medications: Outpatient Medications Prior to Visit  Medication Sig   cyanocobalamin (,VITAMIN B-12,) 1000 MCG/ML injection INJECT 1 ML INTO THE MUSCLE ONCE A WEEK.   cyclobenzaprine (FLEXERIL) 5 MG tablet TAKE 1 TABLET BY MOUTH AT BEDTIME AS NEEDED FOR MUSCLE SPASMS.   hydroxychloroquine (PLAQUENIL) 200 MG tablet Take 300 mg (1.5 tabs) daily or 200 mg (1 tab) alternating with 400 mg (2 tabs) every other day. Sandoz or Prasco generic pharmaceuticals medically necessary   ibuprofen (ADVIL) 600 MG tablet TAKE 1 TABLET BY MOUTH EVERY 8 HOURS AS NEEDED.   levonorgestrel (MIRENA) 20 MCG/24HR IUD 1 each by Intrauterine route once.   linaclotide (LINZESS) 145 MCG CAPS capsule Take 1 capsule (145 mcg total) by mouth daily before breakfast.   omeprazole (PRILOSEC) 40 MG capsule Take 1 capsule (40 mg total) by mouth 2 (two) times daily.   phenazopyridine (PYRIDIUM) 200 MG tablet Take 1 tablet (200 mg total) by mouth 3 (three) times daily as needed for pain.   Vitamin D, Ergocalciferol, (DRISDOL) 1.25 MG (50000 UNIT) CAPS capsule TAKE 1 CAPSULE BY MOUTH EVERY 7 DAYS.    [DISCONTINUED] lisdexamfetamine (VYVANSE) 70 MG capsule Take 1 capsule (70 mg total) by mouth daily.   No facility-administered medications prior to visit.    Review of Systems  Constitutional:  Negative for activity change, appetite change, chills, fatigue and fever.  HENT:  Negative for congestion, postnasal drip, rhinorrhea, sinus pressure, sinus pain, sneezing and sore throat.   Eyes: Negative.   Respiratory:  Negative for cough, chest tightness, shortness of breath and wheezing.   Cardiovascular:  Negative for chest pain and palpitations.  Gastrointestinal:  Negative for abdominal pain, constipation, diarrhea, nausea and vomiting.  Endocrine: Negative for cold intolerance, heat intolerance, polydipsia and polyuria.  Genitourinary:  Positive for dysuria and frequency. Negative for dyspareunia, flank pain and urgency.  Musculoskeletal:  Negative for arthralgias, back pain and myalgias.  Skin:  Negative for rash.  Allergic/Immunologic: Negative for environmental allergies.  Neurological:  Negative for dizziness, weakness and headaches.  Hematological:  Negative for adenopathy.  Psychiatric/Behavioral:  Positive for decreased concentration and dysphoric mood. The patient is nervous/anxious.      Objective     Today's Vitals   06/30/21 1604  BP: 113/70  Pulse: 83  Temp: (!) 97.4 F (36.3 C)  SpO2: 100%  Weight: 166 lb 14.4 oz (75.7 kg)  Height: 5' 8.9" (1.75 m)   Body mass index is 24.72 kg/m.   BP Readings from Last 3 Encounters:  06/30/21 113/70  04/06/21 124/70  02/03/21 108/70    Wt Readings from Last  3 Encounters:  06/30/21 166 lb 14.4 oz (75.7 kg)  04/06/21 167 lb 1.6 oz (75.8 kg)  02/03/21 170 lb 9.6 oz (77.4 kg)    Physical Exam Vitals and nursing note reviewed.  Constitutional:      Appearance: Normal appearance. She is well-developed.  HENT:     Head: Normocephalic and atraumatic.     Mouth/Throat:     Mouth: Mucous membranes are moist.     Pharynx:  Oropharynx is clear.  Eyes:     Extraocular Movements: Extraocular movements intact.     Conjunctiva/sclera: Conjunctivae normal.     Pupils: Pupils are equal, round, and reactive to light.  Cardiovascular:     Rate and Rhythm: Normal rate and regular rhythm.     Pulses: Normal pulses.     Heart sounds: Normal heart sounds.  Pulmonary:     Effort: Pulmonary effort is normal.     Breath sounds: Normal breath sounds.  Abdominal:     Palpations: Abdomen is soft.  Genitourinary:    Comments: Urine sample positive for small blood.  Musculoskeletal:        General: Normal range of motion.     Cervical back: Normal range of motion and neck supple.  Lymphadenopathy:     Cervical: No cervical adenopathy.  Skin:    General: Skin is warm and dry.     Capillary Refill: Capillary refill takes less than 2 seconds.  Neurological:     General: No focal deficit present.     Mental Status: She is alert and oriented to person, place, and time.  Psychiatric:        Attention and Perception: Attention and perception normal.        Mood and Affect: Mood is depressed. Affect is tearful.        Speech: Speech normal.        Behavior: Behavior normal. Behavior is cooperative.        Thought Content: Thought content normal.        Cognition and Memory: Cognition and memory normal.        Judgment: Judgment normal.     Results for orders placed or performed in visit on 06/30/21  Urine Culture   Specimen: Urine   Urine  Result Value Ref Range   Urine Culture, Routine Final report    Organism ID, Bacteria Comment   POCT URINALYSIS DIP (CLINITEK)  Result Value Ref Range   Color, UA yellow yellow   Clarity, UA clear clear   Glucose, UA negative negative mg/dL   Bilirubin, UA negative negative   Ketones, POC UA negative negative mg/dL   Spec Grav, UA >=1.030 (A) 1.010 - 1.025   Blood, UA small (A) negative   pH, UA 5.5 5.0 - 8.0   POC PROTEIN,UA negative negative, trace   Urobilinogen, UA 0.2  0.2 or 1.0 E.U./dL   Nitrite, UA Negative Negative   Leukocytes, UA Negative Negative    Assessment & Plan    1. Urinary tract infection with hematuria, site unspecified Treat with Macrobid 100 mg twice daily for next 7 days.  Send urine for culture and sensitivity and adjust antibiotics as indicated. - Urine Culture; Future - POCT URINALYSIS DIP (CLINITEK) - Urine Culture - nitrofurantoin, macrocrystal-monohydrate, (MACROBID) 100 MG capsule; Take 1 capsule (100 mg total) by mouth 2 (two) times daily.  Dispense: 10 capsule; Refill: 0  2. Other fatigue Check routine fasting labs including thyroid panel, hemoglobin A1c, and anemia panel.  Treat  deficiencies as indicated. - CBC; Future - Comp Met (CMET); Future - Hemoglobin A1c; Future - TSH + free T4; Future - Ferritin; Future - B12 and Folate Panel; Future  3. Vitamin D deficiency Check vitamin D level and treat deficiency as indicated. - Vitamin D (25 hydroxy); Future  4. Vitamin B12 deficiency Check B12 and folate.  Treat deficiency as indicated. - CBC; Future - Comp Met (CMET); Future - Ferritin; Future - B12 and Folate Panel; Future  5. Attention and concentration deficit Patient may continue Vyvanse 70 mg daily when needed. Three written, 30-day prescriptions were given to patient.  Dates are 06/30/2021, 07/28/2021, and 08/26/2021. - lisdexamfetamine (VYVANSE) 70 MG capsule; Take 1 capsule (70 mg total) by mouth daily.  Dispense: 30 capsule; Refill: 0  6. Healthcare maintenance Routine, fasting labs drawn during today's visit.  - CBC; Future - Hemoglobin A1c; Future - Lipid Profile; Future    Problem List Items Addressed This Visit       Genitourinary   Urinary tract infection with hematuria - Primary   Relevant Medications   nitrofurantoin, macrocrystal-monohydrate, (MACROBID) 100 MG capsule   Other Relevant Orders   Urine Culture (Completed)   POCT URINALYSIS DIP (CLINITEK) (Completed)     Other   Other fatigue    Relevant Orders   CBC (Completed)   Comp Met (CMET) (Completed)   Hemoglobin A1c (Completed)   TSH + free T4 (Completed)   Ferritin (Completed)   B12 and Folate Panel (Completed)   Attention and concentration deficit   Relevant Medications   lisdexamfetamine (VYVANSE) 70 MG capsule   Vitamin B12 deficiency   Relevant Orders   CBC (Completed)   Comp Met (CMET) (Completed)   Ferritin (Completed)   B12 and Folate Panel (Completed)   Vitamin D deficiency   Relevant Orders   Vitamin D (25 hydroxy) (Completed)   Other Visit Diagnoses     Healthcare maintenance       Relevant Orders   CBC (Completed)   Hemoglobin A1c (Completed)   Lipid Profile (Completed)        Return in about 3 months (around 09/30/2021) for mood - patient to make appointment for next few days for fasting labs .         Ronnell Freshwater, NP  [facility] at [facility] [phone] (phone) [phone] (fax)  [place]

## 2021-07-01 ENCOUNTER — Other Ambulatory Visit: Payer: 59

## 2021-07-01 ENCOUNTER — Other Ambulatory Visit: Payer: Self-pay

## 2021-07-01 ENCOUNTER — Other Ambulatory Visit: Payer: Self-pay | Admitting: Family Medicine

## 2021-07-01 DIAGNOSIS — Z0000 Encounter for general adult medical examination without abnormal findings: Secondary | ICD-10-CM | POA: Diagnosis not present

## 2021-07-01 DIAGNOSIS — E538 Deficiency of other specified B group vitamins: Secondary | ICD-10-CM

## 2021-07-01 DIAGNOSIS — E559 Vitamin D deficiency, unspecified: Secondary | ICD-10-CM | POA: Diagnosis not present

## 2021-07-01 DIAGNOSIS — R5383 Other fatigue: Secondary | ICD-10-CM | POA: Diagnosis not present

## 2021-07-01 MED FILL — Cyanocobalamin Inj 1000 MCG/ML: INTRAMUSCULAR | 28 days supply | Qty: 4 | Fill #2 | Status: AC

## 2021-07-02 LAB — LIPID PANEL
Chol/HDL Ratio: 1.9 ratio (ref 0.0–4.4)
Cholesterol, Total: 149 mg/dL (ref 100–199)
HDL: 77 mg/dL (ref >39)
LDL Chol Calc (NIH): 63 mg/dL (ref 0–99)
Triglycerides: 37 mg/dL (ref 0–149)
VLDL Cholesterol Cal: 9 mg/dL (ref 5–40)

## 2021-07-02 LAB — COMPREHENSIVE METABOLIC PANEL
ALT: 10 IU/L (ref 0–32)
AST: 17 IU/L (ref 0–40)
Albumin/Globulin Ratio: 1.7 (ref 1.2–2.2)
Albumin: 4.3 g/dL (ref 3.8–4.8)
Alkaline Phosphatase: 60 IU/L (ref 44–121)
BUN/Creatinine Ratio: 13 (ref 9–23)
BUN: 11 mg/dL (ref 6–24)
Bilirubin Total: 1 mg/dL (ref 0.0–1.2)
CO2: 23 mmol/L (ref 20–29)
Calcium: 9.1 mg/dL (ref 8.7–10.2)
Chloride: 102 mmol/L (ref 96–106)
Creatinine, Ser: 0.82 mg/dL (ref 0.57–1.00)
Globulin, Total: 2.6 g/dL (ref 1.5–4.5)
Glucose: 83 mg/dL (ref 70–99)
Potassium: 4 mmol/L (ref 3.5–5.2)
Sodium: 139 mmol/L (ref 134–144)
Total Protein: 6.9 g/dL (ref 6.0–8.5)
eGFR: 91 mL/min/{1.73_m2} (ref >59)

## 2021-07-02 LAB — HEMOGLOBIN A1C
Est. average glucose Bld gHb Est-mCnc: 91 mg/dL
Hgb A1c MFr Bld: 4.8 % (ref 4.8–5.6)

## 2021-07-02 LAB — URINE CULTURE

## 2021-07-02 LAB — TSH+FREE T4
Free T4: 1.14 ng/dL (ref 0.82–1.77)
TSH: 2.37 u[IU]/mL (ref 0.450–4.500)

## 2021-07-02 LAB — B12 AND FOLATE PANEL
Folate: 7.8 ng/mL (ref >3.0)
Vitamin B-12: 601 pg/mL (ref 232–1245)

## 2021-07-02 LAB — CBC
Hematocrit: 39 % (ref 34.0–46.6)
Hemoglobin: 13.1 g/dL (ref 11.1–15.9)
MCH: 31.6 pg (ref 26.6–33.0)
MCHC: 33.6 g/dL (ref 31.5–35.7)
MCV: 94 fL (ref 79–97)
Platelets: 225 10*3/uL (ref 150–450)
RBC: 4.14 x10E6/uL (ref 3.77–5.28)
RDW: 11.3 % — ABNORMAL LOW (ref 11.7–15.4)
WBC: 5.5 10*3/uL (ref 3.4–10.8)

## 2021-07-02 LAB — VITAMIN D 25 HYDROXY (VIT D DEFICIENCY, FRACTURES): Vit D, 25-Hydroxy: 41.9 ng/mL (ref 30.0–100.0)

## 2021-07-02 LAB — FERRITIN: Ferritin: 78 ng/mL (ref 15–150)

## 2021-07-05 ENCOUNTER — Encounter: Payer: Self-pay | Admitting: Nurse Practitioner

## 2021-07-05 NOTE — Progress Notes (Signed)
Treated with macrobid at time of visit.

## 2021-07-08 ENCOUNTER — Ambulatory Visit: Payer: 59 | Admitting: Dermatology

## 2021-07-24 ENCOUNTER — Other Ambulatory Visit: Payer: Self-pay

## 2021-07-24 MED ORDER — LISDEXAMFETAMINE DIMESYLATE 70 MG PO CAPS: ORAL_CAPSULE | ORAL | 0 refills | Status: DC

## 2021-07-24 MED ORDER — LISDEXAMFETAMINE DIMESYLATE 70 MG PO CAPS
ORAL_CAPSULE | ORAL | 0 refills | Status: DC
  Filled 2021-08-24: qty 30, 30d supply, fill #0

## 2021-07-24 MED ORDER — LISDEXAMFETAMINE DIMESYLATE 70 MG PO CAPS
ORAL_CAPSULE | ORAL | 0 refills | Status: DC
  Filled 2021-07-24: qty 30, 30d supply, fill #0

## 2021-07-29 ENCOUNTER — Ambulatory Visit: Payer: 59 | Admitting: Dermatology

## 2021-08-24 ENCOUNTER — Other Ambulatory Visit: Payer: Self-pay

## 2021-09-22 ENCOUNTER — Other Ambulatory Visit: Payer: Self-pay | Admitting: Nurse Practitioner

## 2021-09-22 ENCOUNTER — Other Ambulatory Visit: Payer: Self-pay

## 2021-09-22 DIAGNOSIS — R41840 Attention and concentration deficit: Secondary | ICD-10-CM

## 2021-09-22 MED ORDER — LISDEXAMFETAMINE DIMESYLATE 30 MG PO CAPS
30.0000 mg | ORAL_CAPSULE | Freq: Every day | ORAL | 0 refills | Status: DC
  Filled 2021-09-22: qty 30, 30d supply, fill #0

## 2021-09-22 MED ORDER — LISDEXAMFETAMINE DIMESYLATE 40 MG PO CAPS
40.0000 mg | ORAL_CAPSULE | ORAL | 0 refills | Status: DC
  Filled 2021-09-22: qty 30, 30d supply, fill #0

## 2021-09-25 ENCOUNTER — Other Ambulatory Visit: Payer: Self-pay | Admitting: Family Medicine

## 2021-09-25 ENCOUNTER — Other Ambulatory Visit: Payer: Self-pay

## 2021-09-25 DIAGNOSIS — E559 Vitamin D deficiency, unspecified: Secondary | ICD-10-CM

## 2021-09-25 MED FILL — Cyanocobalamin Inj 1000 MCG/ML: INTRAMUSCULAR | 28 days supply | Qty: 4 | Fill #3 | Status: AC

## 2021-09-28 ENCOUNTER — Other Ambulatory Visit: Payer: Self-pay

## 2021-09-28 MED ORDER — VITAMIN D (ERGOCALCIFEROL) 1.25 MG (50000 UNIT) PO CAPS
ORAL_CAPSULE | ORAL | 2 refills | Status: DC
  Filled 2021-09-28: qty 4, 28d supply, fill #0
  Filled 2022-04-27: qty 4, 28d supply, fill #1
  Filled 2022-06-24: qty 4, 28d supply, fill #2

## 2021-09-29 ENCOUNTER — Other Ambulatory Visit: Payer: Self-pay

## 2021-09-30 ENCOUNTER — Ambulatory Visit (INDEPENDENT_AMBULATORY_CARE_PROVIDER_SITE_OTHER): Payer: 59 | Admitting: Nurse Practitioner

## 2021-09-30 ENCOUNTER — Other Ambulatory Visit: Payer: Self-pay

## 2021-09-30 ENCOUNTER — Encounter: Payer: Self-pay | Admitting: Nurse Practitioner

## 2021-09-30 VITALS — BP 100/61 | HR 64 | Ht 68.9 in | Wt 170.1 lb

## 2021-09-30 DIAGNOSIS — F4321 Adjustment disorder with depressed mood: Secondary | ICD-10-CM | POA: Diagnosis not present

## 2021-09-30 DIAGNOSIS — M0579 Rheumatoid arthritis with rheumatoid factor of multiple sites without organ or systems involvement: Secondary | ICD-10-CM | POA: Diagnosis not present

## 2021-09-30 DIAGNOSIS — R41840 Attention and concentration deficit: Secondary | ICD-10-CM | POA: Diagnosis not present

## 2021-09-30 MED ORDER — AMPHETAMINE-DEXTROAMPHETAMINE 30 MG PO TABS
30.0000 mg | ORAL_TABLET | Freq: Every day | ORAL | 0 refills | Status: DC
  Filled 2021-09-30: qty 60, 60d supply, fill #0

## 2021-09-30 NOTE — Progress Notes (Signed)
Established patient visit   Patient: Haley Wilkins   DOB: [birth year]   41 y.o. Female  MRN: [id number] Visit Date: 09/30/2021   Chief Complaint  Patient presents with   Follow-up   Subjective    HPI  Follow up ADD -takes Vyvanse 70 mg daily to help keep her focused and on track while at work.  She does take holidays from this medication on weekends and days off.  She does need new prescriptions for this today. -still having grief from the death of her mother  -having trouble with memory  -difficulty with sleep.  -vyvanse doesn't seem to be working at all. Really unable to retain information, especially while working.    Medications: Outpatient Medications Prior to Visit  Medication Sig   cyanocobalamin (VITAMIN B12) 1000 MCG/ML injection INJECT 1 ML INTO THE MUSCLE ONCE A WEEK.   cyclobenzaprine (FLEXERIL) 5 MG tablet TAKE 1 TABLET BY MOUTH AT BEDTIME AS NEEDED FOR MUSCLE SPASMS.   hydroxychloroquine (PLAQUENIL) 200 MG tablet Take 300 mg (1.5 tabs) daily or 200 mg (1 tab) alternating with 400 mg (2 tabs) every other day. Sandoz or Prasco generic pharmaceuticals medically necessary   ibuprofen (ADVIL) 600 MG tablet TAKE 1 TABLET BY MOUTH EVERY 8 HOURS AS NEEDED.   levonorgestrel (MIRENA) 20 MCG/24HR IUD 1 each by Intrauterine route once.   linaclotide (LINZESS) 145 MCG CAPS capsule Take 1 capsule (145 mcg total) by mouth daily before breakfast.   lisdexamfetamine (VYVANSE) 30 MG capsule Take 1 capsule (30 mg total) by mouth daily.   lisdexamfetamine (VYVANSE) 40 MG capsule Take 1 capsule (40 mg total) by mouth every morning.   omeprazole (PRILOSEC) 40 MG capsule Take 1 capsule (40 mg total) by mouth 2 (two) times daily.   phenazopyridine (PYRIDIUM) 200 MG tablet Take 1 tablet (200 mg total) by mouth 3 (three) times daily as needed for pain.   Vitamin D, Ergocalciferol, (DRISDOL) 1.25 MG (50000 UNIT) CAPS capsule TAKE 1 CAPSULE BY MOUTH EVERY 7 DAYS.   [DISCONTINUED] nitrofurantoin,  macrocrystal-monohydrate, (MACROBID) 100 MG capsule Take 1 capsule (100 mg total) by mouth 2 (two) times daily.   No facility-administered medications prior to visit.    Review of Systems  Constitutional:  Positive for fatigue. Negative for activity change, appetite change, chills and fever.  HENT:  Negative for congestion, postnasal drip, rhinorrhea, sinus pressure, sinus pain, sneezing and sore throat.   Eyes: Negative.   Respiratory:  Negative for cough, chest tightness, shortness of breath and wheezing.   Cardiovascular:  Negative for chest pain and palpitations.  Gastrointestinal:  Negative for abdominal pain, constipation, diarrhea, nausea and vomiting.  Endocrine: Negative for cold intolerance, heat intolerance, polydipsia and polyuria.  Genitourinary:  Negative for dyspareunia, dysuria, flank pain, frequency and urgency.  Musculoskeletal:  Negative for arthralgias, back pain and myalgias.  Skin:  Negative for rash.  Allergic/Immunologic: Negative for environmental allergies.  Neurological:  Negative for dizziness, weakness and headaches.  Hematological:  Negative for adenopathy.  Psychiatric/Behavioral:  Positive for decreased concentration and dysphoric mood. The patient is nervous/anxious.     Last CBC Lab Results  Component Value Date   WBC 5.5 07/01/2021   HGB 13.1 07/01/2021   HCT 39.0 07/01/2021   MCV 94 07/01/2021   MCH 31.6 07/01/2021   RDW 11.3 (L) 07/01/2021   PLT 225 62/22/9798   Last metabolic panel Lab Results  Component Value Date   GLUCOSE 83 07/01/2021   NA 139 07/01/2021   K 4.0  07/01/2021   CL 102 07/01/2021   CO2 23 07/01/2021   BUN 11 07/01/2021   CREATININE 0.82 07/01/2021   EGFR 91 07/01/2021   CALCIUM 9.1 07/01/2021   PROT 6.9 07/01/2021   ALBUMIN 4.3 07/01/2021   LABGLOB 2.6 07/01/2021   AGRATIO 1.7 07/01/2021   BILITOT 1.0 07/01/2021   ALKPHOS 60 07/01/2021   AST 17 07/01/2021   ALT 10 07/01/2021   ANIONGAP 5 10/01/2015   Last  lipids Lab Results  Component Value Date   CHOL 149 07/01/2021   HDL 77 07/01/2021   LDLCALC 63 07/01/2021   TRIG 37 07/01/2021   CHOLHDL 1.9 07/01/2021   Last hemoglobin A1c Lab Results  Component Value Date   HGBA1C 4.8 07/01/2021   Last thyroid functions Lab Results  Component Value Date   TSH 2.370 07/01/2021   T3TOTAL 116 10/01/2014   T4TOTAL 6.0 04/21/2017   Last vitamin D Lab Results  Component Value Date   VD25OH 41.9 07/01/2021   Last vitamin B12 and Folate Lab Results  Component Value Date   VITAMINB12 601 07/01/2021   FOLATE 7.8 07/01/2021       Objective     Today's Vitals   09/30/21 1614  BP: 100/61  Pulse: 64  SpO2: 100%  Weight: 170 lb 1.9 oz (77.2 kg)  Height: 5' 8.9" (1.75 m)   Body mass index is 25.2 kg/m.   BP Readings from Last 3 Encounters:  09/30/21 100/61  06/30/21 113/70  04/06/21 124/70    Wt Readings from Last 3 Encounters:  09/30/21 170 lb 1.9 oz (77.2 kg)  06/30/21 166 lb 14.4 oz (75.7 kg)  04/06/21 167 lb 1.6 oz (75.8 kg)    Physical Exam Vitals and nursing note reviewed.  Constitutional:      Appearance: Normal appearance. She is well-developed.  HENT:     Head: Normocephalic and atraumatic.     Mouth/Throat:     Mouth: Mucous membranes are moist.     Pharynx: Oropharynx is clear.  Eyes:     Extraocular Movements: Extraocular movements intact.     Conjunctiva/sclera: Conjunctivae normal.     Pupils: Pupils are equal, round, and reactive to light.  Cardiovascular:     Rate and Rhythm: Normal rate and regular rhythm.     Pulses: Normal pulses.     Heart sounds: Normal heart sounds.  Pulmonary:     Effort: Pulmonary effort is normal.     Breath sounds: Normal breath sounds.  Abdominal:     Palpations: Abdomen is soft.  Genitourinary:    Comments: Urine sample positive for small blood.  Musculoskeletal:        General: Normal range of motion.     Cervical back: Normal range of motion and neck supple.   Lymphadenopathy:     Cervical: No cervical adenopathy.  Skin:    General: Skin is warm and dry.     Capillary Refill: Capillary refill takes less than 2 seconds.  Neurological:     General: No focal deficit present.     Mental Status: She is alert and oriented to person, place, and time.  Psychiatric:        Attention and Perception: Attention and perception normal.        Mood and Affect: Mood is depressed. Affect is tearful.        Speech: Speech normal.        Behavior: Behavior normal. Behavior is cooperative.  Thought Content: Thought content normal.        Cognition and Memory: Cognition and memory normal.        Judgment: Judgment normal.       Assessment & Plan    1. Attention and concentration deficit Trial of Adderall 30 mg tablets twice daily as needed.  Patient to advise me of effectiveness.  We will send additional prescriptions at that time. - amphetamine-dextroamphetamine (ADDERALL) 30 MG tablet; Take 1 tablet by mouth daily.  Dispense: 60 tablet; Refill: 0  2. Grief Likely cause of decreased ability to focus and concentrate.  We will reassess at next visit.  3. Rheumatoid arthritis involving multiple sites with positive rheumatoid factor ([place]) Continue regular visits with rheumatology.  Problem List Items Addressed This Visit       Musculoskeletal and Integument   Rheumatoid arthritis involving multiple sites with positive rheumatoid factor (HCC)     Other   Attention and concentration deficit - Primary   Relevant Medications   amphetamine-dextroamphetamine (ADDERALL) 30 MG tablet   Grief     Return in about 3 months (around 12/30/2021) for mood.         Ronnell Freshwater, NP  [facility] at [facility] [phone] (phone) [phone] (fax)  [place]

## 2021-10-01 ENCOUNTER — Other Ambulatory Visit: Payer: Self-pay

## 2021-10-06 DIAGNOSIS — C44219 Basal cell carcinoma of skin of left ear and external auricular canal: Secondary | ICD-10-CM | POA: Diagnosis not present

## 2021-10-18 ENCOUNTER — Other Ambulatory Visit: Payer: Self-pay

## 2021-10-18 DIAGNOSIS — F4321 Adjustment disorder with depressed mood: Secondary | ICD-10-CM | POA: Insufficient documentation

## 2021-10-18 MED ORDER — AMPHETAMINE-DEXTROAMPHETAMINE 30 MG PO TABS
30.0000 mg | ORAL_TABLET | Freq: Two times a day (BID) | ORAL | 0 refills | Status: DC | PRN
  Filled 2021-10-18: qty 60, 30d supply, fill #0

## 2021-10-26 ENCOUNTER — Telehealth: Payer: Self-pay

## 2021-10-26 NOTE — Telephone Encounter (Signed)
History and specimen tracking updated per MOHs progress note. 

## 2021-11-05 ENCOUNTER — Other Ambulatory Visit: Payer: Self-pay

## 2021-11-05 ENCOUNTER — Other Ambulatory Visit: Payer: Self-pay | Admitting: Nurse Practitioner

## 2021-11-05 DIAGNOSIS — R41840 Attention and concentration deficit: Secondary | ICD-10-CM

## 2021-11-05 MED ORDER — LISDEXAMFETAMINE DIMESYLATE 70 MG PO CAPS
70.0000 mg | ORAL_CAPSULE | Freq: Every day | ORAL | 0 refills | Status: DC
  Filled 2021-11-05: qty 30, 30d supply, fill #0

## 2021-12-03 ENCOUNTER — Other Ambulatory Visit: Payer: Self-pay

## 2021-12-03 ENCOUNTER — Other Ambulatory Visit: Payer: Self-pay | Admitting: Nurse Practitioner

## 2021-12-03 DIAGNOSIS — R41840 Attention and concentration deficit: Secondary | ICD-10-CM

## 2021-12-03 MED ORDER — LISDEXAMFETAMINE DIMESYLATE 70 MG PO CAPS
70.0000 mg | ORAL_CAPSULE | Freq: Every day | ORAL | 0 refills | Status: DC
  Filled 2021-12-03: qty 30, 30d supply, fill #0

## 2021-12-04 ENCOUNTER — Other Ambulatory Visit: Payer: Self-pay

## 2021-12-24 ENCOUNTER — Encounter: Payer: Self-pay | Admitting: Nurse Practitioner

## 2021-12-24 ENCOUNTER — Ambulatory Visit (INDEPENDENT_AMBULATORY_CARE_PROVIDER_SITE_OTHER): Payer: 59 | Admitting: Nurse Practitioner

## 2021-12-24 ENCOUNTER — Other Ambulatory Visit: Payer: Self-pay

## 2021-12-24 VITALS — BP 102/61 | HR 74 | Ht 68.9 in | Wt 174.0 lb

## 2021-12-24 DIAGNOSIS — R41840 Attention and concentration deficit: Secondary | ICD-10-CM | POA: Diagnosis not present

## 2021-12-24 DIAGNOSIS — F4321 Adjustment disorder with depressed mood: Secondary | ICD-10-CM

## 2021-12-24 DIAGNOSIS — M0579 Rheumatoid arthritis with rheumatoid factor of multiple sites without organ or systems involvement: Secondary | ICD-10-CM

## 2021-12-24 DIAGNOSIS — F320 Major depressive disorder, single episode, mild: Secondary | ICD-10-CM

## 2021-12-24 MED ORDER — LISDEXAMFETAMINE DIMESYLATE 70 MG PO CAPS
70.0000 mg | ORAL_CAPSULE | Freq: Every day | ORAL | 0 refills | Status: DC
  Filled 2021-12-24 – 2022-01-01 (×2): qty 30, 30d supply, fill #0

## 2021-12-24 MED ORDER — LISDEXAMFETAMINE DIMESYLATE 70 MG PO CAPS
70.0000 mg | ORAL_CAPSULE | Freq: Every day | ORAL | 0 refills | Status: DC
  Filled 2021-12-24: qty 30, 30d supply, fill #0

## 2021-12-24 NOTE — Progress Notes (Signed)
Established patient visit   Patient: Haley Wilkins   DOB: 1980/03/20   41 y.o. Female  MRN: [id number] Visit Date: 12/24/2021   Chief Complaint  Patient presents with   Medication Refill   Subjective    HPI  Follow up ADD -takes Vyvanse 70 mg daily to help keep her focused and on track while at work.  She does take holidays from this medication on weekends and days off.  She does need new prescriptions for this today. --PDMP profile reviewed today.  --overdose risk score 110 with no red flags or state indicators.  -still having grief from the death of her mother  -having trouble with memory    Medications: Outpatient Medications Prior to Visit  Medication Sig   cyanocobalamin (VITAMIN B12) 1000 MCG/ML injection INJECT 1 ML INTO THE MUSCLE ONCE A WEEK.   cyclobenzaprine (FLEXERIL) 5 MG tablet TAKE 1 TABLET BY MOUTH AT BEDTIME AS NEEDED FOR MUSCLE SPASMS.   hydroxychloroquine (PLAQUENIL) 200 MG tablet Take 300 mg (1.5 tabs) daily or 200 mg (1 tab) alternating with 400 mg (2 tabs) every other day. Sandoz or Prasco generic pharmaceuticals medically necessary   levonorgestrel (MIRENA) 20 MCG/24HR IUD 1 each by Intrauterine route once.   linaclotide (LINZESS) 145 MCG CAPS capsule Take 1 capsule (145 mcg total) by mouth daily before breakfast.   omeprazole (PRILOSEC) 40 MG capsule Take 1 capsule (40 mg total) by mouth 2 (two) times daily.   phenazopyridine (PYRIDIUM) 200 MG tablet Take 1 tablet (200 mg total) by mouth 3 (three) times daily as needed for pain.   Vitamin D, Ergocalciferol, (DRISDOL) 1.25 MG (50000 UNIT) CAPS capsule TAKE 1 CAPSULE BY MOUTH EVERY 7 DAYS.   [DISCONTINUED] lisdexamfetamine (VYVANSE) 70 MG capsule Take 1 capsule (70 mg total) by mouth daily.   amphetamine-dextroamphetamine (ADDERALL) 30 MG tablet Take 1 tablet by mouth 2 (two) times daily as needed.   No facility-administered medications prior to visit.    Review of Systems  Constitutional:  Positive for  fatigue. Negative for activity change, appetite change, chills and fever.  HENT:  Negative for congestion, postnasal drip, rhinorrhea, sinus pressure, sinus pain, sneezing and sore throat.   Eyes: Negative.   Respiratory:  Negative for cough, chest tightness, shortness of breath and wheezing.   Cardiovascular:  Negative for chest pain and palpitations.  Gastrointestinal:  Negative for abdominal pain, constipation, diarrhea, nausea and vomiting.  Endocrine: Negative for cold intolerance, heat intolerance, polydipsia and polyuria.  Genitourinary:  Negative for dyspareunia, dysuria, flank pain, frequency and urgency.  Musculoskeletal:  Negative for arthralgias, back pain and myalgias.  Skin:  Negative for rash.  Allergic/Immunologic: Negative for environmental allergies.  Neurological:  Negative for dizziness, weakness and headaches.  Hematological:  Negative for adenopathy.  Psychiatric/Behavioral:  Positive for decreased concentration and dysphoric mood. The patient is nervous/anxious.        Objective     Today's Vitals   12/24/21 1441  BP: 102/61  Pulse: 74  SpO2: 100%  Weight: 174 lb (78.9 kg)  Height: 5' 8.9" (1.75 m)   Body mass index is 25.77 kg/m.  BP Readings from Last 3 Encounters:  12/24/21 102/61  09/30/21 100/61  06/30/21 113/70    Wt Readings from Last 3 Encounters:  12/24/21 174 lb (78.9 kg)  09/30/21 170 lb 1.9 oz (77.2 kg)  06/30/21 166 lb 14.4 oz (75.7 kg)    Physical Exam Vitals and nursing note reviewed.  Constitutional:      Appearance:  Normal appearance. She is well-developed.  HENT:     Head: Normocephalic and atraumatic.     Mouth/Throat:     Mouth: Mucous membranes are moist.     Pharynx: Oropharynx is clear.  Eyes:     Extraocular Movements: Extraocular movements intact.     Conjunctiva/sclera: Conjunctivae normal.     Pupils: Pupils are equal, round, and reactive to light.  Cardiovascular:     Rate and Rhythm: Normal rate and regular  rhythm.     Pulses: Normal pulses.     Heart sounds: Normal heart sounds.  Pulmonary:     Effort: Pulmonary effort is normal.     Breath sounds: Normal breath sounds.  Abdominal:     Palpations: Abdomen is soft.  Genitourinary:    Comments: Urine sample positive for small blood.  Musculoskeletal:        General: Normal range of motion.     Cervical back: Normal range of motion and neck supple.  Lymphadenopathy:     Cervical: No cervical adenopathy.  Skin:    General: Skin is warm and dry.     Capillary Refill: Capillary refill takes less than 2 seconds.  Neurological:     General: No focal deficit present.     Mental Status: She is alert and oriented to person, place, and time.  Psychiatric:        Attention and Perception: Attention and perception normal.        Mood and Affect: Mood is depressed. Affect is tearful.        Speech: Speech normal.        Behavior: Behavior normal. Behavior is cooperative.        Thought Content: Thought content normal.        Cognition and Memory: Cognition and memory normal.        Judgment: Judgment normal.      Assessment & Plan    1. Attention and concentration deficit Stable. Continue vyvanse 70 mg daily. Three 30 day prescriptions were sent to her pharmacy. Dates are 12/24/2021, 01/21/2022, and 02/19/2022.  - lisdexamfetamine (VYVANSE) 70 MG capsule; Take 1 capsule (70 mg total) by mouth daily.  Dispense: 30 capsule; Refill: 0  2. Major depressive disorder, single episode, mild ([place]) Depression mostly due to grief after the loss of her mother. She is in contact with grief counselor for assistance.   3. Grief She is in contact with grief counselor for assistance.   4. Rheumatoid arthritis involving multiple sites with positive rheumatoid factor ([place]) Continue regular  visits with rheumatology as scheduled.    Problem List Items Addressed This Visit       Musculoskeletal and Integument   Rheumatoid arthritis involving multiple  sites with positive rheumatoid factor (HCC)     Other   Attention and concentration deficit - Primary   Relevant Medications   lisdexamfetamine (VYVANSE) 70 MG capsule   Grief   Major depressive disorder, single episode, mild (HCC)     Return in about 3 months (around 03/25/2022) for mood, ADD.         Ronnell Freshwater, NP  [facility] at [facility] [phone] (phone) [phone] (fax)  [place]

## 2022-01-01 ENCOUNTER — Other Ambulatory Visit: Payer: Self-pay

## 2022-01-11 DIAGNOSIS — F320 Major depressive disorder, single episode, mild: Secondary | ICD-10-CM | POA: Insufficient documentation

## 2022-01-13 ENCOUNTER — Telehealth: Payer: Self-pay | Admitting: Certified Nurse Midwife

## 2022-01-13 NOTE — Telephone Encounter (Signed)
Left message for pt to call office back to r/s appt for 02/01/2022.

## 2022-02-01 ENCOUNTER — Encounter: Payer: Self-pay | Admitting: Certified Nurse Midwife

## 2022-02-01 NOTE — Patient Instructions (Incomplete)

## 2022-02-04 ENCOUNTER — Other Ambulatory Visit: Payer: Self-pay | Admitting: Nurse Practitioner

## 2022-02-04 ENCOUNTER — Other Ambulatory Visit: Payer: Self-pay

## 2022-02-04 DIAGNOSIS — R41840 Attention and concentration deficit: Secondary | ICD-10-CM

## 2022-02-04 MED ORDER — LISDEXAMFETAMINE DIMESYLATE 70 MG PO CAPS
70.0000 mg | ORAL_CAPSULE | Freq: Every day | ORAL | 0 refills | Status: DC
  Filled 2022-02-04 – 2022-02-05 (×2): qty 30, 30d supply, fill #0

## 2022-02-05 ENCOUNTER — Other Ambulatory Visit: Payer: Self-pay

## 2022-02-05 ENCOUNTER — Other Ambulatory Visit: Payer: Self-pay | Admitting: Nurse Practitioner

## 2022-02-05 DIAGNOSIS — N390 Urinary tract infection, site not specified: Secondary | ICD-10-CM

## 2022-02-05 MED ORDER — NITROFURANTOIN MONOHYD MACRO 100 MG PO CAPS
100.0000 mg | ORAL_CAPSULE | Freq: Two times a day (BID) | ORAL | 0 refills | Status: DC
  Filled 2022-02-05: qty 14, 7d supply, fill #0

## 2022-02-05 MED FILL — Cyanocobalamin Inj 1000 MCG/ML: INTRAMUSCULAR | 28 days supply | Qty: 4 | Fill #4 | Status: AC

## 2022-03-09 ENCOUNTER — Other Ambulatory Visit: Payer: Self-pay | Admitting: Nurse Practitioner

## 2022-03-09 ENCOUNTER — Other Ambulatory Visit: Payer: Self-pay

## 2022-03-09 DIAGNOSIS — R41840 Attention and concentration deficit: Secondary | ICD-10-CM

## 2022-03-09 MED ORDER — LISDEXAMFETAMINE DIMESYLATE 70 MG PO CAPS
70.0000 mg | ORAL_CAPSULE | Freq: Every day | ORAL | 0 refills | Status: DC
  Filled 2022-03-09: qty 30, 30d supply, fill #0

## 2022-03-25 ENCOUNTER — Encounter: Payer: Self-pay | Admitting: Nurse Practitioner

## 2022-03-25 ENCOUNTER — Other Ambulatory Visit: Payer: Self-pay

## 2022-03-25 ENCOUNTER — Ambulatory Visit (INDEPENDENT_AMBULATORY_CARE_PROVIDER_SITE_OTHER): Payer: Commercial Managed Care - PPO | Admitting: Nurse Practitioner

## 2022-03-25 VITALS — BP 102/65 | HR 78 | Ht 68.9 in | Wt 166.1 lb

## 2022-03-25 DIAGNOSIS — M0579 Rheumatoid arthritis with rheumatoid factor of multiple sites without organ or systems involvement: Secondary | ICD-10-CM

## 2022-03-25 DIAGNOSIS — R41840 Attention and concentration deficit: Secondary | ICD-10-CM | POA: Diagnosis not present

## 2022-03-25 MED ORDER — HYDROXYCHLOROQUINE SULFATE 200 MG PO TABS
ORAL_TABLET | ORAL | 2 refills | Status: DC
  Filled 2022-03-25: qty 135, 90d supply, fill #0
  Filled 2022-06-24: qty 135, 90d supply, fill #1
  Filled 2022-09-22: qty 135, 90d supply, fill #2

## 2022-03-25 MED ORDER — LISDEXAMFETAMINE DIMESYLATE 70 MG PO CAPS: 70.0000 mg | ORAL_CAPSULE | Freq: Every day | ORAL | 0 refills | Status: DC

## 2022-03-25 NOTE — Progress Notes (Signed)
Established patient visit   Patient: Haley Wilkins   DOB: November 19, 1980   42 y.o. Female  MRN: [id number] Visit Date: 03/25/2022   Chief Complaint  Patient presents with   Medical Management of Chronic Issues   Subjective    HPI  Follow up -complicated grief after loss of mother  -takes Vyvanse 70 mg daily to help keep her focused and on track while at work.  She does take holidays from this medication on weekends and days off.  She does need new prescriptions for this today. --PDMP profile reviewed today.  --unintentional overdose risk score is average at 270 with no red flags or state indicators.  -having trouble with memory. Likely related to increased stress and responsibility  -She denies chest pain, chest pressure, or shortness of breath. She denies headaches or visual disturbances. She denies abdominal pain, nausea, vomiting, or changes in bowel or bladder habits.     Medications: Outpatient Medications Prior to Visit  Medication Sig   cyanocobalamin (VITAMIN B12) 1000 MCG/ML injection INJECT 1 ML INTO THE MUSCLE ONCE A WEEK.   levonorgestrel (MIRENA) 20 MCG/24HR IUD 1 each by Intrauterine route once.   linaclotide (LINZESS) 145 MCG CAPS capsule Take 1 capsule (145 mcg total) by mouth daily before breakfast.   omeprazole (PRILOSEC) 40 MG capsule Take 1 capsule (40 mg total) by mouth 2 (two) times daily.   phenazopyridine (PYRIDIUM) 200 MG tablet Take 1 tablet (200 mg total) by mouth 3 (three) times daily as needed for pain.   Vitamin D, Ergocalciferol, (DRISDOL) 1.25 MG (50000 UNIT) CAPS capsule TAKE 1 CAPSULE BY MOUTH EVERY 7 DAYS.   [DISCONTINUED] hydroxychloroquine (PLAQUENIL) 200 MG tablet Take 300 mg (1.5 tabs) daily or 200 mg (1 tab) alternating with 400 mg (2 tabs) every other day. Sandoz or Prasco generic pharmaceuticals medically necessary   [DISCONTINUED] lisdexamfetamine (VYVANSE) 70 MG capsule Take 1 capsule (70 mg total) by mouth daily.   [DISCONTINUED] nitrofurantoin,  macrocrystal-monohydrate, (MACROBID) 100 MG capsule Take 1 capsule (100 mg total) by mouth 2 (two) times daily.   No facility-administered medications prior to visit.    Review of Systems See HPI    Last CBC Lab Results  Component Value Date   WBC 5.5 07/01/2021   HGB 13.1 07/01/2021   HCT 39.0 07/01/2021   MCV 94 07/01/2021   MCH 31.6 07/01/2021   RDW 11.3 (L) 07/01/2021   PLT 225 123456   Last metabolic panel Lab Results  Component Value Date   GLUCOSE 83 07/01/2021   NA 139 07/01/2021   K 4.0 07/01/2021   CL 102 07/01/2021   CO2 23 07/01/2021   BUN 11 07/01/2021   CREATININE 0.82 07/01/2021   EGFR 91 07/01/2021   CALCIUM 9.1 07/01/2021   PROT 6.9 07/01/2021   ALBUMIN 4.3 07/01/2021   LABGLOB 2.6 07/01/2021   AGRATIO 1.7 07/01/2021   BILITOT 1.0 07/01/2021   ALKPHOS 60 07/01/2021   AST 17 07/01/2021   ALT 10 07/01/2021   ANIONGAP 5 10/01/2015   Last lipids Lab Results  Component Value Date   CHOL 149 07/01/2021   HDL 77 07/01/2021   LDLCALC 63 07/01/2021   TRIG 37 07/01/2021   CHOLHDL 1.9 07/01/2021   Last hemoglobin A1c Lab Results  Component Value Date   HGBA1C 4.8 07/01/2021   Last thyroid functions Lab Results  Component Value Date   TSH 2.370 07/01/2021   T3TOTAL 116 10/01/2014   T4TOTAL 6.0 04/21/2017   Last vitamin D  Lab Results  Component Value Date   VD25OH 41.9 07/01/2021   Last vitamin B12 and Folate Lab Results  Component Value Date   VITAMINB12 601 07/01/2021   FOLATE 7.8 07/01/2021       Objective     Today's Vitals   03/25/22 1539  BP: 102/65  Pulse: 78  SpO2: 100%  Weight: 166 lb 1.9 oz (75.4 kg)  Height: 5' 8.9" (1.75 m)   Body mass index is 24.6 kg/m.  BP Readings from Last 3 Encounters:  03/25/22 102/65  12/24/21 102/61  09/30/21 100/61    Wt Readings from Last 3 Encounters:  03/25/22 166 lb 1.9 oz (75.4 kg)  12/24/21 174 lb (78.9 kg)  09/30/21 170 lb 1.9 oz (77.2 kg)    Physical Exam Vitals  and nursing note reviewed.  Constitutional:      Appearance: Normal appearance. She is well-developed.  HENT:     Head: Normocephalic and atraumatic.     Mouth/Throat:     Mouth: Mucous membranes are moist.     Pharynx: Oropharynx is clear.  Eyes:     Extraocular Movements: Extraocular movements intact.     Conjunctiva/sclera: Conjunctivae normal.     Pupils: Pupils are equal, round, and reactive to light.  Cardiovascular:     Rate and Rhythm: Normal rate and regular rhythm.     Pulses: Normal pulses.     Heart sounds: Normal heart sounds.  Pulmonary:     Effort: Pulmonary effort is normal.     Breath sounds: Normal breath sounds.  Abdominal:     Palpations: Abdomen is soft.  Genitourinary:    Comments: Urine sample positive for small blood.  Musculoskeletal:        General: Normal range of motion.     Cervical back: Normal range of motion and neck supple.  Lymphadenopathy:     Cervical: No cervical adenopathy.  Skin:    General: Skin is warm and dry.     Capillary Refill: Capillary refill takes less than 2 seconds.  Neurological:     General: No focal deficit present.     Mental Status: She is alert and oriented to person, place, and time.  Psychiatric:        Attention and Perception: Attention and perception normal.        Mood and Affect: Mood is depressed. Affect is tearful.        Speech: Speech normal.        Behavior: Behavior normal. Behavior is cooperative.        Thought Content: Thought content normal.        Cognition and Memory: Cognition and memory normal.        Judgment: Judgment normal.       Assessment & Plan     Problem List Items Addressed This Visit       Musculoskeletal and Integument   Rheumatoid arthritis involving multiple sites with positive rheumatoid factor (HCC) - Primary   Relevant Medications   hydroxychloroquine (PLAQUENIL) 200 MG tablet     Other   Attention and concentration deficit   Relevant Medications   lisdexamfetamine  (VYVANSE) 70 MG capsule     Return in about 3 months (around 06/23/2022) for mood, ADD.         Ronnell Freshwater, NP  [facility] Primary Care at [facility] [phone] (phone) [phone] (fax)  [place]

## 2022-03-30 ENCOUNTER — Other Ambulatory Visit: Payer: Self-pay

## 2022-03-30 MED ORDER — LISDEXAMFETAMINE DIMESYLATE 70 MG PO CAPS
70.0000 mg | ORAL_CAPSULE | Freq: Every day | ORAL | 0 refills | Status: DC
  Filled 2022-05-07: qty 30, 30d supply, fill #0

## 2022-03-30 MED ORDER — LISDEXAMFETAMINE DIMESYLATE 70 MG PO CAPS
70.0000 mg | ORAL_CAPSULE | Freq: Every day | ORAL | 0 refills | Status: DC
  Filled 2022-04-09: qty 30, 30d supply, fill #0

## 2022-03-30 MED ORDER — LISDEXAMFETAMINE DIMESYLATE 70 MG PO CAPS
70.0000 mg | ORAL_CAPSULE | Freq: Every day | ORAL | 0 refills | Status: DC
  Filled 2022-04-07 – 2022-06-09 (×4): qty 30, 30d supply, fill #0

## 2022-04-07 ENCOUNTER — Other Ambulatory Visit: Payer: Self-pay

## 2022-04-09 ENCOUNTER — Other Ambulatory Visit: Payer: Self-pay

## 2022-04-25 NOTE — Assessment & Plan Note (Signed)
Stable. Continue Vyvanse 70 mg daily when needed. Three 30 day prescriptions were went to her pharmacy today

## 2022-04-25 NOTE — Assessment & Plan Note (Signed)
Stable. Renew prescription for plaquenil  to stabilize symptoms. Patient to follow up with rheumatology for further evaluation and treatment

## 2022-04-26 ENCOUNTER — Other Ambulatory Visit: Payer: Self-pay | Admitting: Nurse Practitioner

## 2022-04-26 DIAGNOSIS — J0140 Acute pansinusitis, unspecified: Secondary | ICD-10-CM

## 2022-04-26 MED ORDER — AMOXICILLIN-POT CLAVULANATE 875-125 MG PO TABS
1.0000 | ORAL_TABLET | Freq: Two times a day (BID) | ORAL | 0 refills | Status: DC
  Filled 2022-04-26 – 2022-04-27 (×2): qty 14, 7d supply, fill #0

## 2022-04-26 MED ORDER — METHYLPREDNISOLONE 4 MG PO TBPK
ORAL_TABLET | ORAL | 0 refills | Status: DC
  Filled 2022-04-26 – 2022-04-27 (×2): qty 21, 6d supply, fill #0

## 2022-04-27 ENCOUNTER — Other Ambulatory Visit: Payer: Self-pay

## 2022-05-07 ENCOUNTER — Other Ambulatory Visit: Payer: Self-pay

## 2022-06-09 ENCOUNTER — Other Ambulatory Visit: Payer: Self-pay

## 2022-06-23 ENCOUNTER — Encounter: Payer: Self-pay | Admitting: Nurse Practitioner

## 2022-06-23 ENCOUNTER — Ambulatory Visit: Payer: Commercial Managed Care - PPO | Admitting: Nurse Practitioner

## 2022-06-23 ENCOUNTER — Other Ambulatory Visit: Payer: Self-pay

## 2022-06-23 VITALS — BP 106/70 | HR 71 | Ht 68.9 in | Wt 171.0 lb

## 2022-06-23 DIAGNOSIS — E785 Hyperlipidemia, unspecified: Secondary | ICD-10-CM

## 2022-06-23 DIAGNOSIS — E559 Vitamin D deficiency, unspecified: Secondary | ICD-10-CM | POA: Diagnosis not present

## 2022-06-23 DIAGNOSIS — E538 Deficiency of other specified B group vitamins: Secondary | ICD-10-CM | POA: Diagnosis not present

## 2022-06-23 DIAGNOSIS — M0579 Rheumatoid arthritis with rheumatoid factor of multiple sites without organ or systems involvement: Secondary | ICD-10-CM

## 2022-06-23 DIAGNOSIS — R41840 Attention and concentration deficit: Secondary | ICD-10-CM

## 2022-06-23 DIAGNOSIS — R5383 Other fatigue: Secondary | ICD-10-CM | POA: Diagnosis not present

## 2022-06-23 DIAGNOSIS — Z1231 Encounter for screening mammogram for malignant neoplasm of breast: Secondary | ICD-10-CM

## 2022-06-23 MED ORDER — LISDEXAMFETAMINE DIMESYLATE 70 MG PO CAPS
70.0000 mg | ORAL_CAPSULE | Freq: Every day | ORAL | 0 refills | Status: DC
  Filled 2022-07-08: qty 30, 30d supply, fill #0

## 2022-06-23 MED ORDER — IBUPROFEN 800 MG PO TABS
800.0000 mg | ORAL_TABLET | Freq: Three times a day (TID) | ORAL | 2 refills | Status: DC | PRN
  Filled 2022-06-23: qty 90, 30d supply, fill #0
  Filled 2022-12-19 – 2023-01-20 (×2): qty 90, 30d supply, fill #1

## 2022-06-23 MED ORDER — LISDEXAMFETAMINE DIMESYLATE 70 MG PO CAPS
70.0000 mg | ORAL_CAPSULE | Freq: Every day | ORAL | 0 refills | Status: DC
  Filled 2022-08-10: qty 30, 30d supply, fill #0

## 2022-06-23 MED ORDER — LISDEXAMFETAMINE DIMESYLATE 70 MG PO CAPS
70.0000 mg | ORAL_CAPSULE | Freq: Every day | ORAL | 0 refills | Status: DC
  Filled 2022-09-07: qty 30, 30d supply, fill #0

## 2022-06-23 NOTE — Progress Notes (Signed)
Established patient visit   Patient: Haley Wilkins   DOB: [birth year]   42 y.o. Female  MRN: [id number] Visit Date: 06/23/2022   Chief Complaint  Patient presents with   Medical Management of Chronic Issues   Subjective    HPI  Follow up -complicated grief after loss of mother  -takes Vyvanse 70 mg daily to help keep her focused and on track while at work.  She does take holidays from this medication on weekends and days off.  She does need new prescriptions for this today. --PDMP profile reviewed today.  --unintentional overdose risk score is average at 270 with no red flags or state indicators.  --last fill for vyvanse was 06/09/2022  Feels like something has changed  -having frequent night sweats. Does have IUD so does not have regular periods  -had spotting since she got IUD replaced and this has suddenly stopped.   Active RA -having some joint pain along outer aspect of right foot, just under the 5th toe.  -no injury -no swelling or joint deformity   -She denies chest pain, chest pressure, or shortness of breath. She denies headaches or visual disturbances. She denies abdominal pain, nausea, vomiting, or changes in bowel or bladder habits.     Medications: Outpatient Medications Prior to Visit  Medication Sig   cyanocobalamin (VITAMIN B12) 1000 MCG/ML injection INJECT 1 ML INTO THE MUSCLE ONCE A WEEK.   hydroxychloroquine (PLAQUENIL) 200 MG tablet Take 300 mg (1.5 tabs) daily or 200 mg (1 tab) alternating with 400 mg (2 tabs) every other day. Sandoz or Prasco generic pharmaceuticals medically necessary   levonorgestrel (MIRENA) 20 MCG/24HR IUD 1 each by Intrauterine route once.   linaclotide (LINZESS) 145 MCG CAPS capsule Take 1 capsule (145 mcg total) by mouth daily before breakfast.   phenazopyridine (PYRIDIUM) 200 MG tablet Take 1 tablet (200 mg total) by mouth 3 (three) times daily as needed for pain.   Vitamin D, Ergocalciferol, (DRISDOL) 1.25 MG (50000 UNIT) CAPS  capsule TAKE 1 CAPSULE BY MOUTH EVERY 7 DAYS.   [DISCONTINUED] lisdexamfetamine (VYVANSE) 70 MG capsule Take 1 capsule (70 mg total) by mouth daily.   [DISCONTINUED] lisdexamfetamine (VYVANSE) 70 MG capsule Take 1 capsule (70 mg total) by mouth daily.   [DISCONTINUED] lisdexamfetamine (VYVANSE) 70 MG capsule Take 1 capsule (70 mg total) by mouth daily.   [DISCONTINUED] lisdexamfetamine (VYVANSE) 70 MG capsule Take 1 capsule (70 mg total) by mouth daily. (DNF 05/21/22)   [DISCONTINUED] amoxicillin-clavulanate (AUGMENTIN) 875-125 MG tablet Take 1 tablet by mouth 2 (two) times daily.   [DISCONTINUED] methylPREDNISolone (MEDROL) 4 MG TBPK tablet Take by mouth as directed for 6 days   [DISCONTINUED] omeprazole (PRILOSEC) 40 MG capsule Take 1 capsule (40 mg total) by mouth 2 (two) times daily.   No facility-administered medications prior to visit.    Review of Systems  See HPI     Objective     Today's Vitals   06/23/22 1418  BP: 106/70  Pulse: 71  SpO2: 100%  Weight: 171 lb (77.6 kg)  Height: 5' 8.9" (1.75 m)   Body mass index is 25.33 kg/m.  BP Readings from Last 3 Encounters:  06/23/22 106/70  03/25/22 102/65  12/24/21 102/61    Wt Readings from Last 3 Encounters:  06/23/22 171 lb (77.6 kg)  03/25/22 166 lb 1.9 oz (75.4 kg)  12/24/21 174 lb (78.9 kg)    Physical Exam Vitals and nursing note reviewed.  Constitutional:      Appearance:  Normal appearance. She is well-developed.  HENT:     Head: Normocephalic and atraumatic.     Nose: Nose normal.     Mouth/Throat:     Mouth: Mucous membranes are moist.     Pharynx: Oropharynx is clear.  Eyes:     Extraocular Movements: Extraocular movements intact.     Conjunctiva/sclera: Conjunctivae normal.     Pupils: Pupils are equal, round, and reactive to light.  Neck:     Vascular: No carotid bruit.  Cardiovascular:     Rate and Rhythm: Normal rate and regular rhythm.     Pulses: Normal pulses.     Heart sounds: Normal heart  sounds.  Pulmonary:     Effort: Pulmonary effort is normal.     Breath sounds: Normal breath sounds.  Abdominal:     Palpations: Abdomen is soft.  Musculoskeletal:        General: Normal range of motion.     Cervical back: Normal range of motion and neck supple.  Lymphadenopathy:     Cervical: Cervical adenopathy present.  Skin:    General: Skin is warm and dry.     Capillary Refill: Capillary refill takes less than 2 seconds.  Neurological:     General: No focal deficit present.     Mental Status: She is alert and oriented to person, place, and time.  Psychiatric:        Mood and Affect: Mood normal.        Behavior: Behavior normal.        Thought Content: Thought content normal.        Judgment: Judgment normal.      Assessment & Plan    Rheumatoid arthritis involving multiple sites with positive rheumatoid factor (HCC) Assessment & Plan: Increased generalized joint pain.   -Check RA factor, ANA with reflex, and sed rate for further evaluation. -Renew prescription for plaquenil  to stabilize symptoms. Patient to follow up with rheumatology for further evaluation and treatment   Orders: -     Ibuprofen; Take 1 tablet (800 mg total) by mouth every 8 (eight) hours as needed.  Dispense: 90 tablet; Refill: 2 -     ANA w/Reflex if Positive; Future -     Rheumatoid factor; Future -     Sedimentation rate; Future -     Uric acid; Future  Vitamin B12 deficiency Assessment & Plan: Check vitamin B-12 levels. -Restart monthly B12 injections as indicated.  Orders: -     CBC; Future -     B12 and Folate Panel; Future -     Ferritin; Future  Vitamin D deficiency Assessment & Plan: Check vitamin d level and treat deficiency as indicated.    Orders: -     VITAMIN D 25 Hydroxy (Vit-D Deficiency, Fractures); Future  Dyslipidemia, goal LDL below 100 Assessment & Plan: Check fasting lipid panel and treat as indicated.  Orders: -     Lipid panel; Future  Other  fatigue Assessment & Plan: Check labs for further evaluation.  Orders: -     TSH + free T4; Future -     Hemoglobin A1c; Future -     CBC; Future -     B12 and Folate Panel; Future -     Ferritin; Future  Attention and concentration deficit Assessment & Plan: Stable. -Continue Vyvanse 70 mg daily. -three 30-day prescriptions printed and given to patient to take to pharmacy.  Orders: -     Lisdexamfetamine Dimesylate; Take 1  capsule (70 mg total) by mouth daily.  Dispense: 30 capsule; Refill: 0 -     Lisdexamfetamine Dimesylate; Take 1 capsule (70 mg total) by mouth daily. (DNF 05/21/22)  Dispense: 30 capsule; Refill: 0 -     Lisdexamfetamine Dimesylate; Take 1 capsule (70 mg total) by mouth daily.  Dispense: 30 capsule; Refill: 0  Encounter for screening mammogram for malignant neoplasm of breast     Return in about 3 months (around 09/23/2022) for mood, ADD. Friday AM, needs lab appointment. i will put orders in later on. TY.         Carlean Jews, NP  [facility] Primary Care at [facility] [phone] (phone) [phone] (fax)  [facility]

## 2022-06-24 ENCOUNTER — Other Ambulatory Visit: Payer: Self-pay

## 2022-06-24 ENCOUNTER — Other Ambulatory Visit: Payer: Commercial Managed Care - PPO

## 2022-06-24 ENCOUNTER — Other Ambulatory Visit: Payer: Self-pay | Admitting: Nurse Practitioner

## 2022-06-24 DIAGNOSIS — Z0000 Encounter for general adult medical examination without abnormal findings: Secondary | ICD-10-CM

## 2022-06-24 DIAGNOSIS — K219 Gastro-esophageal reflux disease without esophagitis: Secondary | ICD-10-CM

## 2022-06-24 MED ORDER — OMEPRAZOLE 40 MG PO CPDR
40.0000 mg | DELAYED_RELEASE_CAPSULE | Freq: Two times a day (BID) | ORAL | 3 refills | Status: DC
Start: 2022-06-24 — End: 2023-11-13
  Filled 2022-06-24: qty 180, 90d supply, fill #0

## 2022-06-25 LAB — LIPID PANEL
Chol/HDL Ratio: 2 ratio (ref 0.0–4.4)
Cholesterol, Total: 157 mg/dL (ref 100–199)
HDL: 78 mg/dL (ref >39)
LDL Chol Calc (NIH): 70 mg/dL (ref 0–99)
Triglycerides: 37 mg/dL (ref 0–149)
VLDL Cholesterol Cal: 9 mg/dL (ref 5–40)

## 2022-06-25 LAB — CBC WITH DIFFERENTIAL/PLATELET
Basophils Absolute: 0 10*3/uL (ref 0.0–0.2)
Basos: 0 %
EOS (ABSOLUTE): 0.1 10*3/uL (ref 0.0–0.4)
Eos: 2 %
Hematocrit: 39.9 % (ref 34.0–46.6)
Hemoglobin: 13.2 g/dL (ref 11.1–15.9)
Immature Grans (Abs): 0 10*3/uL (ref 0.0–0.1)
Immature Granulocytes: 0 %
Lymphocytes Absolute: 1 10*3/uL (ref 0.7–3.1)
Lymphs: 22 %
MCH: 31.5 pg (ref 26.6–33.0)
MCHC: 33.1 g/dL (ref 31.5–35.7)
MCV: 95 fL (ref 79–97)
Monocytes Absolute: 0.4 10*3/uL (ref 0.1–0.9)
Monocytes: 9 %
Neutrophils Absolute: 3 10*3/uL (ref 1.4–7.0)
Neutrophils: 67 %
Platelets: 232 10*3/uL (ref 150–450)
RBC: 4.19 x10E6/uL (ref 3.77–5.28)
RDW: 11.7 % (ref 11.7–15.4)
WBC: 4.5 10*3/uL (ref 3.4–10.8)

## 2022-06-25 LAB — COMPREHENSIVE METABOLIC PANEL
ALT: 11 IU/L (ref 0–32)
AST: 15 IU/L (ref 0–40)
Albumin/Globulin Ratio: 1.5 (ref 1.2–2.2)
Albumin: 4.3 g/dL (ref 3.9–4.9)
Alkaline Phosphatase: 68 IU/L (ref 44–121)
BUN/Creatinine Ratio: 11 (ref 9–23)
BUN: 11 mg/dL (ref 6–24)
Bilirubin Total: 0.8 mg/dL (ref 0.0–1.2)
CO2: 22 mmol/L (ref 20–29)
Calcium: 9.1 mg/dL (ref 8.7–10.2)
Chloride: 104 mmol/L (ref 96–106)
Creatinine, Ser: 0.98 mg/dL (ref 0.57–1.00)
Globulin, Total: 2.8 g/dL (ref 1.5–4.5)
Glucose: 98 mg/dL (ref 70–99)
Potassium: 4.1 mmol/L (ref 3.5–5.2)
Sodium: 139 mmol/L (ref 134–144)
Total Protein: 7.1 g/dL (ref 6.0–8.5)
eGFR: 73 mL/min/{1.73_m2} (ref >59)

## 2022-06-25 LAB — HEMOGLOBIN A1C
Est. average glucose Bld gHb Est-mCnc: 100 mg/dL
Hgb A1c MFr Bld: 5.1 % (ref 4.8–5.6)

## 2022-06-25 LAB — TSH: TSH: 2.88 u[IU]/mL (ref 0.450–4.500)

## 2022-06-25 NOTE — Progress Notes (Signed)
Normal labs. Need additional labs. Orders placed in epic. Will notify patient

## 2022-06-29 ENCOUNTER — Other Ambulatory Visit: Payer: Commercial Managed Care - PPO

## 2022-06-29 ENCOUNTER — Other Ambulatory Visit: Payer: Self-pay

## 2022-06-29 DIAGNOSIS — E785 Hyperlipidemia, unspecified: Secondary | ICD-10-CM | POA: Diagnosis not present

## 2022-06-29 DIAGNOSIS — E559 Vitamin D deficiency, unspecified: Secondary | ICD-10-CM

## 2022-06-29 DIAGNOSIS — M0579 Rheumatoid arthritis with rheumatoid factor of multiple sites without organ or systems involvement: Secondary | ICD-10-CM

## 2022-06-29 DIAGNOSIS — E538 Deficiency of other specified B group vitamins: Secondary | ICD-10-CM | POA: Diagnosis not present

## 2022-06-29 DIAGNOSIS — R5383 Other fatigue: Secondary | ICD-10-CM | POA: Diagnosis not present

## 2022-06-30 LAB — ANA W/REFLEX IF POSITIVE

## 2022-06-30 LAB — TSH+FREE T4: Free T4: 1.21 ng/dL (ref 0.82–1.77)

## 2022-06-30 LAB — LIPID PANEL
Cholesterol, Total: 149 mg/dL (ref 100–199)
HDL: 77 mg/dL (ref >39)
LDL Chol Calc (NIH): 63 mg/dL (ref 0–99)
VLDL Cholesterol Cal: 9 mg/dL (ref 5–40)

## 2022-06-30 LAB — RHEUMATOID FACTOR: Rheumatoid fact SerPl-aCnc: <10 IU/mL (ref <14.0)

## 2022-06-30 LAB — SEDIMENTATION RATE: Sed Rate: 2 mm/hr (ref 0–32)

## 2022-06-30 LAB — CBC: Platelets: 242 10*3/uL (ref 150–450)

## 2022-06-30 LAB — B12 AND FOLATE PANEL: Vitamin B-12: 278 pg/mL (ref 232–1245)

## 2022-06-30 LAB — HEMOGLOBIN A1C: Hgb A1c MFr Bld: 5.1 % (ref 4.8–5.6)

## 2022-06-30 LAB — URIC ACID: Uric Acid: 4 mg/dL (ref 2.6–6.2)

## 2022-07-01 ENCOUNTER — Encounter: Payer: Self-pay | Admitting: Nurse Practitioner

## 2022-07-01 LAB — CBC
Hematocrit: 38.7 % (ref 34.0–46.6)
Hemoglobin: 12.6 g/dL (ref 11.1–15.9)
MCH: 31.4 pg (ref 26.6–33.0)
MCHC: 32.6 g/dL (ref 31.5–35.7)
MCV: 97 fL (ref 79–97)
RBC: 4.01 x10E6/uL (ref 3.77–5.28)
RDW: 11.8 % (ref 11.7–15.4)
WBC: 5.2 10*3/uL (ref 3.4–10.8)

## 2022-07-01 LAB — ANA W/REFLEX IF POSITIVE
Anti Nuclear Antibody (ANA): POSITIVE — AB
Chromatin Ab SerPl-aCnc: <0.2 AI (ref 0.0–0.9)
ENA RNP Ab: 0.4 AI (ref 0.0–0.9)
ENA SM Ab Ser-aCnc: <0.2 AI (ref 0.0–0.9)
ENA SSB (LA) Ab: 0.2 AI (ref 0.0–0.9)

## 2022-07-01 LAB — TSH+FREE T4: TSH: 3.21 u[IU]/mL (ref 0.450–4.500)

## 2022-07-01 LAB — LIPID PANEL
Chol/HDL Ratio: 1.9 ratio (ref 0.0–4.4)
Triglycerides: 36 mg/dL (ref 0–149)

## 2022-07-01 LAB — FERRITIN: Ferritin: 95 ng/mL (ref 15–150)

## 2022-07-01 LAB — HEMOGLOBIN A1C: Est. average glucose Bld gHb Est-mCnc: 100 mg/dL

## 2022-07-01 LAB — B12 AND FOLATE PANEL: Folate: 3.6 ng/mL (ref >3.0)

## 2022-07-01 LAB — VITAMIN D 25 HYDROXY (VIT D DEFICIENCY, FRACTURES): Vit D, 25-Hydroxy: 30.2 ng/mL (ref 30.0–100.0)

## 2022-07-01 NOTE — Progress Notes (Signed)
MyChart message sent to patient regarding labs. Likely positive for Sjogren's disease. She should follow up with her rheumatologist.

## 2022-07-08 ENCOUNTER — Other Ambulatory Visit: Payer: Self-pay

## 2022-07-11 DIAGNOSIS — E785 Hyperlipidemia, unspecified: Secondary | ICD-10-CM | POA: Insufficient documentation

## 2022-07-11 NOTE — Assessment & Plan Note (Signed)
Increased generalized joint pain.   -Check RA factor, ANA with reflex, and sed rate for further evaluation. -Renew prescription for plaquenil  to stabilize symptoms. Patient to follow up with rheumatology for further evaluation and treatment

## 2022-07-11 NOTE — Assessment & Plan Note (Signed)
Check fasting lipid panel and treat as indicated  

## 2022-07-11 NOTE — Assessment & Plan Note (Signed)
Check vitamin d level and treat deficiency as indicated.   

## 2022-07-11 NOTE — Assessment & Plan Note (Signed)
Check vitamin B-12 levels. -Restart monthly B12 injections as indicated.

## 2022-07-11 NOTE — Assessment & Plan Note (Signed)
Check labs for further evaluation.  

## 2022-07-11 NOTE — Assessment & Plan Note (Signed)
Stable. -Continue Vyvanse 70 mg daily. -three 30-day prescriptions printed and given to patient to take to pharmacy.

## 2022-08-10 ENCOUNTER — Other Ambulatory Visit: Payer: Self-pay

## 2022-08-11 ENCOUNTER — Telehealth: Payer: Self-pay | Admitting: *Deleted

## 2022-08-27 ENCOUNTER — Other Ambulatory Visit: Payer: Self-pay | Admitting: Nurse Practitioner

## 2022-08-27 DIAGNOSIS — E538 Deficiency of other specified B group vitamins: Secondary | ICD-10-CM

## 2022-08-27 DIAGNOSIS — E559 Vitamin D deficiency, unspecified: Secondary | ICD-10-CM

## 2022-08-29 ENCOUNTER — Other Ambulatory Visit: Payer: Self-pay | Admitting: Nurse Practitioner

## 2022-08-29 ENCOUNTER — Other Ambulatory Visit: Payer: Self-pay

## 2022-08-29 DIAGNOSIS — E559 Vitamin D deficiency, unspecified: Secondary | ICD-10-CM

## 2022-08-29 DIAGNOSIS — E538 Deficiency of other specified B group vitamins: Secondary | ICD-10-CM

## 2022-08-30 ENCOUNTER — Other Ambulatory Visit: Payer: Self-pay

## 2022-08-31 ENCOUNTER — Other Ambulatory Visit: Payer: Self-pay

## 2022-09-01 NOTE — Telephone Encounter (Signed)
Was trying to reach pt to reschedule with one of the current providers since other provider has left the office.

## 2022-09-02 ENCOUNTER — Other Ambulatory Visit: Payer: Self-pay

## 2022-09-02 MED FILL — Cyanocobalamin Inj 1000 MCG/ML: INTRAMUSCULAR | 28 days supply | Qty: 4 | Fill #0 | Status: AC

## 2022-09-03 ENCOUNTER — Other Ambulatory Visit: Payer: Self-pay

## 2022-09-07 ENCOUNTER — Other Ambulatory Visit: Payer: Self-pay

## 2022-09-10 ENCOUNTER — Other Ambulatory Visit: Payer: Self-pay

## 2022-09-22 ENCOUNTER — Other Ambulatory Visit: Payer: Self-pay

## 2022-09-22 MED FILL — Cyanocobalamin Inj 1000 MCG/ML: INTRAMUSCULAR | 28 days supply | Qty: 4 | Fill #1 | Status: CN

## 2022-09-23 ENCOUNTER — Ambulatory Visit: Payer: Commercial Managed Care - PPO | Admitting: Nurse Practitioner

## 2022-09-29 ENCOUNTER — Other Ambulatory Visit: Payer: Self-pay

## 2022-09-29 MED FILL — Cyanocobalamin Inj 1000 MCG/ML: INTRAMUSCULAR | 28 days supply | Qty: 4 | Fill #1 | Status: AC

## 2022-10-06 ENCOUNTER — Other Ambulatory Visit: Payer: Self-pay

## 2022-10-06 ENCOUNTER — Telehealth: Payer: Self-pay

## 2022-10-06 DIAGNOSIS — R41840 Attention and concentration deficit: Secondary | ICD-10-CM

## 2022-10-06 MED ORDER — LISDEXAMFETAMINE DIMESYLATE 70 MG PO CAPS
70.0000 mg | ORAL_CAPSULE | Freq: Every day | ORAL | 0 refills | Status: DC
Start: 2022-10-06 — End: 2022-10-18
  Filled 2022-10-06 – 2022-10-08 (×2): qty 30, 30d supply, fill #0

## 2022-10-06 MED ORDER — LISDEXAMFETAMINE DIMESYLATE 70 MG PO CAPS
70.0000 mg | ORAL_CAPSULE | Freq: Every day | ORAL | 0 refills | Status: DC
Start: 2022-10-06 — End: 2022-10-06

## 2022-10-06 NOTE — Telephone Encounter (Signed)
Meds ordered this encounter  Medications   lisdexamfetamine (VYVANSE) 70 MG capsule    Sig: Take 1 capsule (70 mg total) by mouth daily.    Dispense:  30 capsule    Refill:  0    Order Specific Question:   Supervising Provider    Answer:   Nani Gasser D [[id number]]

## 2022-10-06 NOTE — Telephone Encounter (Signed)
Prescription Request  10/06/2022  LOV: 06/23/22  What is the name of the medication or equipment?  lisdexamfetamine (VYVANSE) 70 MG capsule   Have you contacted your pharmacy to request a refill? No   Which pharmacy would you like this sent to?  [facility] [address] [place] [address] Phone: [phone] Fax: [phone]    Patient notified that their request is being sent to the clinical staff for review and that they should receive a response within 2 business days.   Please advise at Mobile [phone] (mobile)

## 2022-10-06 NOTE — Addendum Note (Signed)
Addended by: Saralyn Pilar on: 10/06/2022 04:51 PM   Modules accepted: Orders

## 2022-10-06 NOTE — Addendum Note (Signed)
Addended by: Saralyn Pilar on: 10/06/2022 04:40 PM   Modules accepted: Orders

## 2022-10-08 ENCOUNTER — Other Ambulatory Visit: Payer: Self-pay

## 2022-10-18 ENCOUNTER — Ambulatory Visit: Payer: Commercial Managed Care - PPO | Admitting: Family Medicine

## 2022-10-18 ENCOUNTER — Encounter: Payer: Self-pay | Admitting: Family Medicine

## 2022-10-18 ENCOUNTER — Other Ambulatory Visit: Payer: Self-pay

## 2022-10-18 VITALS — BP 122/76 | HR 75 | Resp 18 | Ht 68.9 in | Wt 173.0 lb

## 2022-10-18 DIAGNOSIS — F320 Major depressive disorder, single episode, mild: Secondary | ICD-10-CM

## 2022-10-18 DIAGNOSIS — D1803 Hemangioma of intra-abdominal structures: Secondary | ICD-10-CM

## 2022-10-18 DIAGNOSIS — E785 Hyperlipidemia, unspecified: Secondary | ICD-10-CM

## 2022-10-18 DIAGNOSIS — Z1159 Encounter for screening for other viral diseases: Secondary | ICD-10-CM

## 2022-10-18 DIAGNOSIS — R41840 Attention and concentration deficit: Secondary | ICD-10-CM | POA: Diagnosis not present

## 2022-10-18 DIAGNOSIS — E663 Overweight: Secondary | ICD-10-CM | POA: Diagnosis not present

## 2022-10-18 DIAGNOSIS — E559 Vitamin D deficiency, unspecified: Secondary | ICD-10-CM | POA: Diagnosis not present

## 2022-10-18 DIAGNOSIS — I872 Venous insufficiency (chronic) (peripheral): Secondary | ICD-10-CM | POA: Diagnosis not present

## 2022-10-18 MED ORDER — LISDEXAMFETAMINE DIMESYLATE 70 MG PO CAPS: 70.0000 mg | ORAL_CAPSULE | Freq: Every day | ORAL | 0 refills | Status: DC

## 2022-10-18 MED ORDER — LISDEXAMFETAMINE DIMESYLATE 70 MG PO CAPS
70.0000 mg | ORAL_CAPSULE | Freq: Every day | ORAL | 0 refills | Status: DC
  Filled 2022-12-10: qty 30, 30d supply, fill #0

## 2022-10-18 NOTE — Assessment & Plan Note (Signed)
Bilateral peripheral edema and leg heaviness likely due to chronic venous insufficiency.  Patient declines ultrasound at this time and would like to instead use conservative measures like walking, leg elevation, and compression stockings to see if her symptoms respond.

## 2022-10-18 NOTE — Assessment & Plan Note (Signed)
Stable, will continue to monitor.

## 2022-10-18 NOTE — Progress Notes (Signed)
Established Patient Office Visit  Subjective   Patient ID: Haley Wilkins, female    DOB: 04-03-1980  Age: 42 y.o. MRN: [id number]  Chief Complaint  Patient presents with   ADD   Anxiety   Depression   Foot Swelling   Leg Swelling    HPI Haley Wilkins is a 42 y.o. female presenting today for follow up of ADHD, mood.  She also notes that she has experienced bilateral leg and foot swelling for the past couple of months.  She has a standing desk and a walking pad that she uses while she works from home.  When she does sit, she keeps her legs elevated is much as possible.  She states that despite this, her legs feel heavy almost every day particularly after sitting for a while.  It does not seem to improve with walking. ADHD: Reports symptoms are well controlled on Vyvanse. Denies any problems with insomnia, chest pain, palpitations, or SOB.   Mood: Patient is not currently taking any medication but is doing well.     10/18/2022    2:28 PM 06/23/2022    2:19 PM 03/25/2022    3:41 PM  Depression screen PHQ 2/9  Decreased Interest 0 0 0  Down, Depressed, Hopeless 0 0 0  PHQ - 2 Score 0 0 0  Altered sleeping 0 0 0  Tired, decreased energy 0 0 0  Change in appetite 0 0 0  Feeling bad or failure about yourself  0 0 0  Trouble concentrating 2 1 2   Moving slowly or fidgety/restless 0 0 0  Suicidal thoughts 0 0 0  PHQ-9 Score 2 1 2   Difficult doing work/chores Somewhat difficult Somewhat difficult        03/25/2022    3:41 PM 12/24/2021    2:44 PM 09/30/2021    4:16 PM 06/30/2021    4:09 PM  GAD 7 : Generalized Anxiety Score  Nervous, Anxious, on Edge 0 0 1 0  Control/stop worrying 0 0 1 0  Worry too much - different things 0 0 1 1  Trouble relaxing 0 0 0 0  Restless 0 0 1 0  Easily annoyed or irritable 0 0 1 0  Afraid - awful might happen 0 0 0 0  Total GAD 7 Score 0 0 5 1   Outpatient Medications Prior to Visit  Medication Sig   cyanocobalamin (VITAMIN B12) 1000 MCG/ML injection  Inject 1 mL (1,000 mcg total) into the muscle once a week.   hydroxychloroquine (PLAQUENIL) 200 MG tablet Take 300 mg (1.5 tabs) daily or 200 mg (1 tab) alternating with 400 mg (2 tabs) every other day. Sandoz or Prasco generic pharmaceuticals medically necessary   ibuprofen (ADVIL) 800 MG tablet Take 1 tablet (800 mg total) by mouth every 8 (eight) hours as needed.   levonorgestrel (MIRENA) 20 MCG/24HR IUD 1 each by Intrauterine route once.   linaclotide (LINZESS) 145 MCG CAPS capsule Take 1 capsule (145 mcg total) by mouth daily before breakfast.   omeprazole (PRILOSEC) 40 MG capsule Take 1 capsule (40 mg total) by mouth 2 (two) times daily.   phenazopyridine (PYRIDIUM) 200 MG tablet Take 1 tablet (200 mg total) by mouth 3 (three) times daily as needed for pain.   Vitamin D, Ergocalciferol, (DRISDOL) 1.25 MG (50000 UNIT) CAPS capsule TAKE 1 CAPSULE BY MOUTH EVERY 7 DAYS.   [DISCONTINUED] lisdexamfetamine (VYVANSE) 70 MG capsule Take 1 capsule (70 mg total) by mouth daily.   No  facility-administered medications prior to visit.    ROS Negative unless otherwise noted in HPI   Objective:     BP 122/76 (BP Location: Left Arm, Patient Position: Sitting, Cuff Size: Normal)   Pulse 75   Resp 18   Ht 5' 8.9" (1.75 m)   Wt 173 lb (78.5 kg)   SpO2 100%   BMI 25.62 kg/m   Physical Exam Constitutional:      General: She is not in acute distress.    Appearance: Normal appearance.  HENT:     Head: Normocephalic and atraumatic.  Cardiovascular:     Rate and Rhythm: Normal rate and regular rhythm.     Heart sounds: No murmur heard.    No friction rub. No gallop.  Pulmonary:     Effort: Pulmonary effort is normal. No respiratory distress.     Breath sounds: No wheezing, rhonchi or rales.  Skin:    General: Skin is warm and dry.  Neurological:     Mental Status: She is alert and oriented to person, place, and time.     Assessment & Plan:  Attention and concentration deficit Assessment  & Plan: Stable. Continue Vyvanse 70 mg daily.  One prescription already sent on 10/06/2022.  Two additional 30-day sent to pharmacy.  Orders: -     Lisdexamfetamine Dimesylate; Take 1 capsule (70 mg total) by mouth daily.  Dispense: 30 capsule; Refill: 0 -     Lisdexamfetamine Dimesylate; Take 1 capsule (70 mg total) by mouth daily.  Dispense: 30 capsule; Refill: 0  Major depressive disorder, single episode, mild (HCC) Assessment & Plan: Stable, will continue to monitor.   Hemangioma of liver Assessment & Plan: First diagnosed in 2017 at [facility] liver clinic.  Patient reports that provider did not think that she needed that level of specialized care and recommended that she establish with a general gastroenterologist.  She has not established with gastroenterology yet. Last CT in November 2021 by PCP with recommendation to repeat in 6 months, it does not appear that this was ever done.  Sending referral to establish with gastroenterology at [facility].  Depending on when they are able to schedule her for, may order CT at her annual physical.  Orders: -     Ambulatory referral to Gastroenterology  Chronic venous insufficiency of lower extremity Assessment & Plan: Bilateral peripheral edema and leg heaviness likely due to chronic venous insufficiency.  Patient declines ultrasound at this time and would like to instead use conservative measures like walking, leg elevation, and compression stockings to see if her symptoms respond.   Screening for viral disease -     Hepatitis C antibody; Future  Vitamin D deficiency -     VITAMIN D 25 Hydroxy (Vit-D Deficiency, Fractures); Future  Dyslipidemia, goal LDL below 100 -     Lipid panel; Future  Overweight with body mass index (BMI) 25.0-29.9 -     CBC with Differential/Platelet; Future -     Comprehensive metabolic panel; Future -     Hemoglobin A1c; Future -     Lipid panel; Future -     TSH Rfx on Abnormal to Free T4; Future -      VITAMIN D 25 Hydroxy (Vit-D Deficiency, Fractures); Future    Return in about 3 months (around 01/17/2023) for annual physical, fasting blood work 1 week before.    Melida Quitter, PA

## 2022-10-18 NOTE — Assessment & Plan Note (Signed)
First diagnosed in 2017 at [facility] liver clinic.  Patient reports that provider did not think that she needed that level of specialized care and recommended that she establish with a general gastroenterologist.  She has not established with gastroenterology yet. Last CT in November 2021 by PCP with recommendation to repeat in 6 months, it does not appear that this was ever done.  Sending referral to establish with gastroenterology at [facility].  Depending on when they are able to schedule her for, may order CT at her annual physical.

## 2022-10-18 NOTE — Assessment & Plan Note (Signed)
Stable. Continue Vyvanse 70 mg daily.  One prescription already sent on 10/06/2022.  Two additional 30-day sent to pharmacy.

## 2022-10-27 ENCOUNTER — Other Ambulatory Visit: Payer: Self-pay | Admitting: Nurse Practitioner

## 2022-10-27 DIAGNOSIS — N921 Excessive and frequent menstruation with irregular cycle: Secondary | ICD-10-CM | POA: Diagnosis not present

## 2022-10-27 DIAGNOSIS — Z1151 Encounter for screening for human papillomavirus (HPV): Secondary | ICD-10-CM | POA: Diagnosis not present

## 2022-10-27 DIAGNOSIS — Z1331 Encounter for screening for depression: Secondary | ICD-10-CM | POA: Diagnosis not present

## 2022-10-27 DIAGNOSIS — Z124 Encounter for screening for malignant neoplasm of cervix: Secondary | ICD-10-CM | POA: Diagnosis not present

## 2022-10-27 DIAGNOSIS — E559 Vitamin D deficiency, unspecified: Secondary | ICD-10-CM

## 2022-10-27 DIAGNOSIS — Z975 Presence of (intrauterine) contraceptive device: Secondary | ICD-10-CM | POA: Diagnosis not present

## 2022-10-27 DIAGNOSIS — Z01411 Encounter for gynecological examination (general) (routine) with abnormal findings: Secondary | ICD-10-CM | POA: Diagnosis not present

## 2022-10-27 DIAGNOSIS — N951 Menopausal and female climacteric states: Secondary | ICD-10-CM | POA: Diagnosis not present

## 2022-10-27 DIAGNOSIS — Z1339 Encounter for screening examination for other mental health and behavioral disorders: Secondary | ICD-10-CM | POA: Diagnosis not present

## 2022-10-28 ENCOUNTER — Other Ambulatory Visit: Payer: Self-pay

## 2022-10-28 DIAGNOSIS — Z1231 Encounter for screening mammogram for malignant neoplasm of breast: Secondary | ICD-10-CM

## 2022-10-28 NOTE — Telephone Encounter (Signed)
After finishing course of prescription strength vitamin D, she should switch to over-the-counter vitamin D3 2000 unit daily supplement to maintain vitamin D levels.  We will recheck vitamin D with her annual physical labs.

## 2022-10-29 ENCOUNTER — Ambulatory Visit
Admission: RE | Admit: 2022-10-29 | Discharge: 2022-10-29 | Disposition: A | Discharge status: Home / Self Care | Payer: Commercial Managed Care - PPO | Source: Ambulatory Visit

## 2022-10-29 DIAGNOSIS — Z1231 Encounter for screening mammogram for malignant neoplasm of breast: Secondary | ICD-10-CM | POA: Diagnosis not present

## 2022-11-04 ENCOUNTER — Other Ambulatory Visit: Payer: Self-pay

## 2022-11-04 ENCOUNTER — Telehealth: Payer: Self-pay

## 2022-11-04 DIAGNOSIS — R41840 Attention and concentration deficit: Secondary | ICD-10-CM

## 2022-11-04 DIAGNOSIS — R928 Other abnormal and inconclusive findings on diagnostic imaging of breast: Secondary | ICD-10-CM

## 2022-11-04 MED ORDER — LISDEXAMFETAMINE DIMESYLATE 70 MG PO CAPS
70.0000 mg | ORAL_CAPSULE | Freq: Every day | ORAL | 0 refills | Status: DC
  Filled 2022-11-04: qty 30, 30d supply, fill #0

## 2022-11-04 NOTE — Telephone Encounter (Signed)
Pt is calling to see if the pharmacy can be called to ok an early refill due to her going out of town tomorrow.  The paper Rx says do not refill before 11/08/22  [facility] Phone: [phone]  Fax: [phone]

## 2022-11-04 NOTE — Telephone Encounter (Signed)
Meds ordered this encounter  Medications   lisdexamfetamine (VYVANSE) 70 MG capsule    Sig: Take 1 capsule (70 mg total) by mouth daily.    Dispense:  30 capsule    Refill:  0    Added to original prescription requesting first fill on or after 11/08/2022, patient going out of town and needs early refill today    Order Specific Question:   Supervising Provider    Answer:   Nani Gasser D [[id number]]

## 2022-11-09 ENCOUNTER — Ambulatory Visit
Admission: RE | Admit: 2022-11-09 | Discharge: 2022-11-09 | Disposition: A | Discharge status: Home / Self Care | Payer: Commercial Managed Care - PPO | Source: Ambulatory Visit

## 2022-11-09 DIAGNOSIS — R928 Other abnormal and inconclusive findings on diagnostic imaging of breast: Secondary | ICD-10-CM | POA: Diagnosis not present

## 2022-11-09 DIAGNOSIS — N6311 Unspecified lump in the right breast, upper outer quadrant: Secondary | ICD-10-CM | POA: Diagnosis not present

## 2022-11-09 DIAGNOSIS — N6001 Solitary cyst of right breast: Secondary | ICD-10-CM | POA: Diagnosis not present

## 2022-11-09 DIAGNOSIS — R92333 Mammographic heterogeneous density, bilateral breasts: Secondary | ICD-10-CM | POA: Diagnosis not present

## 2022-11-16 ENCOUNTER — Other Ambulatory Visit: Payer: Commercial Managed Care - PPO

## 2022-11-25 ENCOUNTER — Other Ambulatory Visit: Payer: Self-pay | Admitting: Nurse Practitioner

## 2022-11-25 ENCOUNTER — Other Ambulatory Visit: Payer: Self-pay

## 2022-11-25 DIAGNOSIS — E559 Vitamin D deficiency, unspecified: Secondary | ICD-10-CM

## 2022-11-26 ENCOUNTER — Other Ambulatory Visit: Payer: Self-pay

## 2022-11-26 MED ORDER — VITAMIN D (ERGOCALCIFEROL) 1.25 MG (50000 UNIT) PO CAPS
50000.0000 [IU] | ORAL_CAPSULE | ORAL | 2 refills | Status: DC
  Filled 2022-11-26: qty 4, 28d supply, fill #0
  Filled 2023-01-20: qty 4, 28d supply, fill #1

## 2022-11-26 NOTE — Addendum Note (Signed)
Addended by: Saralyn Pilar on: 11/26/2022 12:05 PM   Modules accepted: Orders

## 2022-12-10 ENCOUNTER — Other Ambulatory Visit: Payer: Self-pay

## 2023-01-03 ENCOUNTER — Other Ambulatory Visit: Payer: Self-pay

## 2023-01-03 ENCOUNTER — Telehealth: Payer: Self-pay | Admitting: *Deleted

## 2023-01-03 DIAGNOSIS — R41840 Attention and concentration deficit: Secondary | ICD-10-CM

## 2023-01-03 MED ORDER — LISDEXAMFETAMINE DIMESYLATE 70 MG PO CAPS
70.0000 mg | ORAL_CAPSULE | Freq: Every day | ORAL | 0 refills | Status: DC
  Filled 2023-01-03 – 2023-01-10 (×2): qty 30, 30d supply, fill #0

## 2023-01-03 NOTE — Telephone Encounter (Signed)
Contacted pt to change appt due to provider being out of office the week of christmas.  Pt is requesting enough medication for her Vyvanse be sent in to [facility] at [facility] to last until her appointment on 01/31/23 She said she will run out on Sunday.

## 2023-01-03 NOTE — Telephone Encounter (Signed)
Meds ordered this encounter  Medications   lisdexamfetamine (VYVANSE) 70 MG capsule    Sig: Take 1 capsule (70 mg total) by mouth daily.    Dispense:  30 capsule    Refill:  0    First fill on or after 12/06/2022    Order Specific Question:   Supervising Provider    Answer:   Sandre Kitty [[id number]]

## 2023-01-04 ENCOUNTER — Other Ambulatory Visit: Payer: Self-pay

## 2023-01-10 ENCOUNTER — Other Ambulatory Visit: Payer: Self-pay

## 2023-01-12 ENCOUNTER — Other Ambulatory Visit: Payer: Commercial Managed Care - PPO

## 2023-01-12 DIAGNOSIS — E559 Vitamin D deficiency, unspecified: Secondary | ICD-10-CM

## 2023-01-12 DIAGNOSIS — Z1159 Encounter for screening for other viral diseases: Secondary | ICD-10-CM | POA: Diagnosis not present

## 2023-01-12 DIAGNOSIS — E785 Hyperlipidemia, unspecified: Secondary | ICD-10-CM

## 2023-01-12 DIAGNOSIS — E663 Overweight: Secondary | ICD-10-CM | POA: Diagnosis not present

## 2023-01-13 LAB — TSH RFX ON ABNORMAL TO FREE T4: TSH: 2.27 u[IU]/mL (ref 0.450–4.500)

## 2023-01-13 LAB — CBC WITH DIFFERENTIAL/PLATELET
Basophils Absolute: 0 10*3/uL (ref 0.0–0.2)
Basos: 0 %
EOS (ABSOLUTE): 0.1 10*3/uL (ref 0.0–0.4)
Eos: 2 %
Hematocrit: 42.4 % (ref 34.0–46.6)
Hemoglobin: 13.7 g/dL (ref 11.1–15.9)
Immature Grans (Abs): 0 10*3/uL (ref 0.0–0.1)
Immature Granulocytes: 0 %
Lymphocytes Absolute: 1.1 10*3/uL (ref 0.7–3.1)
Lymphs: 14 %
MCH: 32 pg (ref 26.6–33.0)
MCHC: 32.3 g/dL (ref 31.5–35.7)
MCV: 99 fL — ABNORMAL HIGH (ref 79–97)
Monocytes Absolute: 0.6 10*3/uL (ref 0.1–0.9)
Monocytes: 8 %
Neutrophils Absolute: 5.9 10*3/uL (ref 1.4–7.0)
Neutrophils: 76 %
Platelets: 201 10*3/uL (ref 150–450)
RBC: 4.28 x10E6/uL (ref 3.77–5.28)
RDW: 11.5 % — ABNORMAL LOW (ref 11.7–15.4)
WBC: 7.8 10*3/uL (ref 3.4–10.8)

## 2023-01-13 LAB — HEPATITIS C ANTIBODY: Hep C Virus Ab: NONREACTIVE

## 2023-01-13 LAB — COMPREHENSIVE METABOLIC PANEL
ALT: 10 [IU]/L (ref 0–32)
AST: 20 [IU]/L (ref 0–40)
Albumin: 4.1 g/dL (ref 3.9–4.9)
Alkaline Phosphatase: 67 [IU]/L (ref 44–121)
BUN/Creatinine Ratio: 7 — ABNORMAL LOW (ref 9–23)
BUN: 6 mg/dL (ref 6–24)
Bilirubin Total: 0.8 mg/dL (ref 0.0–1.2)
CO2: 23 mmol/L (ref 20–29)
Calcium: 9.1 mg/dL (ref 8.7–10.2)
Chloride: 104 mmol/L (ref 96–106)
Creatinine, Ser: 0.81 mg/dL (ref 0.57–1.00)
Globulin, Total: 2.7 g/dL (ref 1.5–4.5)
Glucose: 80 mg/dL (ref 70–99)
Potassium: 4.1 mmol/L (ref 3.5–5.2)
Sodium: 140 mmol/L (ref 134–144)
Total Protein: 6.8 g/dL (ref 6.0–8.5)
eGFR: 92 mL/min/{1.73_m2} (ref >59)

## 2023-01-13 LAB — LIPID PANEL
Chol/HDL Ratio: 2.2 {ratio} (ref 0.0–4.4)
Cholesterol, Total: 155 mg/dL (ref 100–199)
HDL: 72 mg/dL (ref >39)
LDL Chol Calc (NIH): 74 mg/dL (ref 0–99)
Triglycerides: 38 mg/dL (ref 0–149)
VLDL Cholesterol Cal: 9 mg/dL (ref 5–40)

## 2023-01-13 LAB — HEMOGLOBIN A1C
Est. average glucose Bld gHb Est-mCnc: 100 mg/dL
Hgb A1c MFr Bld: 5.1 % (ref 4.8–5.6)

## 2023-01-13 LAB — VITAMIN D 25 HYDROXY (VIT D DEFICIENCY, FRACTURES): Vit D, 25-Hydroxy: 39.4 ng/mL (ref 30.0–100.0)

## 2023-01-17 ENCOUNTER — Encounter: Payer: Commercial Managed Care - PPO | Admitting: Family Medicine

## 2023-01-31 ENCOUNTER — Encounter: Payer: Self-pay | Admitting: Family Medicine

## 2023-01-31 ENCOUNTER — Other Ambulatory Visit: Payer: Self-pay

## 2023-01-31 ENCOUNTER — Ambulatory Visit (INDEPENDENT_AMBULATORY_CARE_PROVIDER_SITE_OTHER): Payer: Commercial Managed Care - PPO | Admitting: Family Medicine

## 2023-01-31 VITALS — BP 131/77 | HR 74 | Temp 98.1°F | Ht 68.9 in | Wt 174.0 lb

## 2023-01-31 DIAGNOSIS — M0579 Rheumatoid arthritis with rheumatoid factor of multiple sites without organ or systems involvement: Secondary | ICD-10-CM | POA: Diagnosis not present

## 2023-01-31 DIAGNOSIS — Z0000 Encounter for general adult medical examination without abnormal findings: Secondary | ICD-10-CM | POA: Diagnosis not present

## 2023-01-31 DIAGNOSIS — R41840 Attention and concentration deficit: Secondary | ICD-10-CM | POA: Diagnosis not present

## 2023-01-31 DIAGNOSIS — E559 Vitamin D deficiency, unspecified: Secondary | ICD-10-CM | POA: Diagnosis not present

## 2023-01-31 MED ORDER — LISDEXAMFETAMINE DIMESYLATE 70 MG PO CAPS
70.0000 mg | ORAL_CAPSULE | Freq: Every day | ORAL | 0 refills | Status: DC
  Filled 2023-03-10: qty 30, 30d supply, fill #0

## 2023-01-31 MED ORDER — VITAMIN D (ERGOCALCIFEROL) 1.25 MG (50000 UNIT) PO CAPS
50000.0000 [IU] | ORAL_CAPSULE | ORAL | 2 refills | Status: AC
  Filled 2023-01-31 – 2023-04-08 (×3): qty 12, 84d supply, fill #0
  Filled 2023-07-04: qty 12, 84d supply, fill #1
  Filled 2023-12-09: qty 12, 84d supply, fill #2

## 2023-01-31 MED ORDER — HYDROXYCHLOROQUINE SULFATE 200 MG PO TABS
300.0000 mg | ORAL_TABLET | Freq: Every day | ORAL | 2 refills | Status: DC
  Filled 2023-01-31: qty 135, 90d supply, fill #0
  Filled 2023-04-08: qty 135, 90d supply, fill #1

## 2023-01-31 MED ORDER — LISDEXAMFETAMINE DIMESYLATE 70 MG PO CAPS
70.0000 mg | ORAL_CAPSULE | Freq: Every day | ORAL | 0 refills | Status: DC
Start: 2023-01-31 — End: 2023-04-14
  Filled 2023-01-31 – 2023-02-09 (×2): qty 30, 30d supply, fill #0

## 2023-01-31 MED ORDER — LISDEXAMFETAMINE DIMESYLATE 70 MG PO CAPS
70.0000 mg | ORAL_CAPSULE | Freq: Every day | ORAL | 0 refills | Status: DC
  Filled 2023-04-08: qty 30, 30d supply, fill #0

## 2023-01-31 NOTE — Progress Notes (Signed)
 Complete physical exam  Patient: Haley Wilkins   DOB: 05-11-1980   43 y.o. Female  MRN: [id number]  Subjective:    Chief Complaint  Patient presents with   Annual Exam    Haley Wilkins is a 43 y.o. female who presents today for a complete physical exam. She reports consuming a general diet.  She generally feels fairly well. She reports sleeping well. She does not have additional problems to discuss today.    Most recent fall risk assessment:    01/31/2023    1:55 PM  Fall Risk   Falls in the past year? 0  Number falls in past yr: 0  Injury with Fall? 0  Risk for fall due to : No Fall Risks  Follow up Falls evaluation completed     Most recent depression and anxiety screenings:    01/31/2023    1:55 PM 10/18/2022    2:28 PM  PHQ 2/9 Scores  PHQ - 2 Score 0 0  PHQ- 9 Score 0 2      01/31/2023    1:55 PM 03/25/2022    3:41 PM 12/24/2021    2:44 PM 09/30/2021    4:16 PM  GAD 7 : Generalized Anxiety Score  Nervous, Anxious, on Edge 0 0 0 1  Control/stop worrying 0 0 0 1  Worry too much - different things 0 0 0 1  Trouble relaxing 0 0 0 0  Restless 0 0 0 1  Easily annoyed or irritable 0 0 0 1  Afraid - awful might happen 0 0 0 0  Total GAD 7 Score 0 0 0 5    Patient Active Problem List   Diagnosis Date Noted   Chronic venous insufficiency of lower extremity 10/18/2022   Dyslipidemia, goal LDL below 100 07/11/2022   Overweight with body mass index (BMI) 25.0-29.9 01/19/2021   Ocular migraine 10/14/2020   Chronic non-seasonal allergic rhinitis 04/16/2020   Muscle pain, lumbar 02/11/2020   Hemangioma of liver 12/09/2019   Gastroesophageal reflux disease without esophagitis 06/05/2019   Undifferentiated connective tissue disease (HCC) 05/22/2018   Vitamin B12 deficiency 10/21/2017   Vitamin D  deficiency 10/21/2017   Rheumatoid arthritis involving multiple sites with positive rheumatoid factor (HCC) 06/27/2017   Attention and concentration deficit 05/29/2017   Major  depressive disorder, single episode, mild (HCC) 05/29/2017   Dysmenorrhea 12/04/2015   Pelvic congestion syndrome 12/04/2015   Lesion of liver 10/29/2015   Other fatigue 02/28/2015    Past Surgical History:  Procedure Laterality Date   CESAREAN SECTION     LEEP  2006   LITHOTRIPSY  2003   Social History   Tobacco Use   Smoking status: Never   Smokeless tobacco: Never  Vaping Use   Vaping status: Never Used  Substance Use Topics   Alcohol use: Not Currently   Drug use: No   Family History  Problem Relation Age of Onset   Diabetes Mother    Diabetes Father    Diabetes Maternal Grandfather    Breast cancer Neg Hx    No Known Allergies   Patient Care Team: Wallace Joesph LABOR, PA as PCP - General (Family Medicine)   Outpatient Medications Prior to Visit  Medication Sig   cyanocobalamin  (VITAMIN B12) 1000 MCG/ML injection Inject 1 mL (1,000 mcg total) into the muscle once a week.   ibuprofen  (ADVIL ) 800 MG tablet Take 1 tablet (800 mg total) by mouth every 8 (eight) hours as needed.   levonorgestrel  (  MIRENA ) 20 MCG/24HR IUD 1 each by Intrauterine route once.   linaclotide  (LINZESS ) 145 MCG CAPS capsule Take 1 capsule (145 mcg total) by mouth daily before breakfast.   omeprazole  (PRILOSEC) 40 MG capsule Take 1 capsule (40 mg total) by mouth 2 (two) times daily.   phenazopyridine  (PYRIDIUM ) 200 MG tablet Take 1 tablet (200 mg total) by mouth 3 (three) times daily as needed for pain.   [DISCONTINUED] hydroxychloroquine  (PLAQUENIL ) 200 MG tablet Take 300 mg (1.5 tabs) daily or 200 mg (1 tab) alternating with 400 mg (2 tabs) every other day. Sandoz or Prasco generic pharmaceuticals medically necessary   [DISCONTINUED] lisdexamfetamine (VYVANSE ) 70 MG capsule Take 1 capsule (70 mg total) by mouth daily.   [DISCONTINUED] Vitamin D , Ergocalciferol , (DRISDOL ) 1.25 MG (50000 UNIT) CAPS capsule TAKE 1 CAPSULE BY MOUTH EVERY 7 DAYS.   No facility-administered medications prior to visit.     Review of Systems  Constitutional:  Negative for chills, fever and malaise/fatigue.  HENT:  Negative for congestion and hearing loss.   Eyes:  Negative for blurred vision and double vision.  Respiratory:  Negative for cough and shortness of breath.   Cardiovascular:  Negative for chest pain, palpitations and leg swelling.  Gastrointestinal:  Negative for abdominal pain, constipation, diarrhea and heartburn.  Genitourinary:  Negative for frequency and urgency.  Musculoskeletal:  Negative for myalgias and neck pain.  Neurological:  Negative for headaches.  Endo/Heme/Allergies:  Negative for polydipsia.  Psychiatric/Behavioral:  Negative for depression. The patient is not nervous/anxious.       Objective:    BP 131/77   Pulse 74   Temp 98.1 F (36.7 C) (Oral)   Ht 5' 8.9 (1.75 m)   Wt 174 lb 0.6 oz (78.9 kg)   SpO2 100%   BMI 25.78 kg/m    Physical Exam Constitutional:      General: She is not in acute distress.    Appearance: Normal appearance.  HENT:     Head: Normocephalic and atraumatic.     Right Ear: Tympanic membrane, ear canal and external ear normal.     Left Ear: Tympanic membrane, ear canal and external ear normal.     Nose: Nose normal.     Mouth/Throat:     Mouth: Mucous membranes are moist.     Pharynx: No oropharyngeal exudate or posterior oropharyngeal erythema.  Eyes:     Extraocular Movements: Extraocular movements intact.     Conjunctiva/sclera: Conjunctivae normal.     Pupils: Pupils are equal, round, and reactive to light.     Comments: She wears glasses occasionally but not every day  Neck:     Thyroid : No thyroid  mass, thyromegaly or thyroid  tenderness.  Cardiovascular:     Rate and Rhythm: Normal rate and regular rhythm.     Heart sounds: Normal heart sounds. No murmur heard.    No friction rub. No gallop.  Pulmonary:     Effort: Pulmonary effort is normal. No respiratory distress.     Breath sounds: Normal breath sounds. No wheezing,  rhonchi or rales.  Abdominal:     General: Abdomen is flat. Bowel sounds are normal. There is no distension.     Palpations: There is no mass.     Tenderness: There is no abdominal tenderness. There is no guarding.  Musculoskeletal:        General: Normal range of motion.     Cervical back: Normal range of motion and neck supple.  Lymphadenopathy:  Cervical: No cervical adenopathy.  Skin:    General: Skin is warm and dry.  Neurological:     Mental Status: She is alert and oriented to person, place, and time.     Cranial Nerves: No cranial nerve deficit.     Motor: No weakness.     Deep Tendon Reflexes: Reflexes normal.  Psychiatric:        Mood and Affect: Mood normal.        Assessment & Plan:    Routine Health Maintenance and Physical Exam  Immunization History  Administered Date(s) Administered   Influenza-Unspecified 11/02/2019    Health Maintenance  Topic Date Due   COVID-19 Vaccine (1) Never done   DTaP/Tdap/Td (1 - Tdap) Never done   INFLUENZA VACCINE  04/25/2023 (Originally 08/26/2022)   MAMMOGRAM  10/29/2023   Cervical Cancer Screening (HPV/Pap Cotest)  01/28/2024   Hepatitis C Screening  Completed   HIV Screening  Completed   HPV VACCINES  Aged Out    Reviewed most recent labs including CBC, CMP, lipid panel, A1C, TSH, and vitamin D . All within normal limits/stable from last check. She is up-to-date on her tetanus booster but needs to find the date.  Discussed health benefits of physical activity, and encouraged her to engage in regular exercise appropriate for her age and condition.  Wellness examination  Attention and concentration deficit -     Lisdexamfetamine Dimesylate ; Take 1 capsule (70 mg total) by mouth daily.  Dispense: 30 capsule; Refill: 0 -     Lisdexamfetamine Dimesylate ; Take 1 capsule (70 mg total) by mouth daily.  Dispense: 30 capsule; Refill: 0 -     Lisdexamfetamine Dimesylate ; Take 1 capsule (70 mg total) by mouth daily.  Dispense:  30 capsule; Refill: 0  Rheumatoid arthritis involving multiple sites with positive rheumatoid factor (HCC) -     Hydroxychloroquine  Sulfate; Take 300 mg (1.5 tabs) daily or 200 mg (1 tab) alternating with 400 mg (2 tabs) every other day. Sandoz or Prasco generic pharmaceuticals medically necessary  Dispense: 135 tablet; Refill: 2  Vitamin D  deficiency, unspecified -     Vitamin D  (Ergocalciferol ); TAKE 1 CAPSULE BY MOUTH EVERY 7 DAYS.  Dispense: 12 capsule; Refill: 2    Return in about 3 months (around 05/01/2023) for follow-up for ADHD, in person or video.     Joesph DELENA Sear, PA

## 2023-01-31 NOTE — Patient Instructions (Signed)
 If you are able to track down when your last tetanus booster was, please send a message on MyChart with the date!

## 2023-02-09 ENCOUNTER — Other Ambulatory Visit: Payer: Self-pay

## 2023-02-10 ENCOUNTER — Other Ambulatory Visit: Payer: Self-pay

## 2023-02-25 ENCOUNTER — Other Ambulatory Visit: Payer: Self-pay

## 2023-03-03 ENCOUNTER — Encounter: Payer: Self-pay | Admitting: Family Medicine

## 2023-03-08 ENCOUNTER — Other Ambulatory Visit: Payer: Self-pay

## 2023-03-08 ENCOUNTER — Other Ambulatory Visit: Payer: Self-pay | Admitting: Nurse Practitioner

## 2023-03-08 DIAGNOSIS — K5900 Constipation, unspecified: Secondary | ICD-10-CM | POA: Diagnosis not present

## 2023-03-08 DIAGNOSIS — K769 Liver disease, unspecified: Secondary | ICD-10-CM

## 2023-03-08 DIAGNOSIS — D1803 Hemangioma of intra-abdominal structures: Secondary | ICD-10-CM | POA: Diagnosis not present

## 2023-03-08 DIAGNOSIS — K219 Gastro-esophageal reflux disease without esophagitis: Secondary | ICD-10-CM | POA: Diagnosis not present

## 2023-03-08 DIAGNOSIS — Z1211 Encounter for screening for malignant neoplasm of colon: Secondary | ICD-10-CM | POA: Diagnosis not present

## 2023-03-08 MED ORDER — NA SULFATE-K SULFATE-MG SULF 17.5-3.13-1.6 GM/177ML PO SOLN
ORAL | 0 refills | Status: AC
  Filled 2023-03-08: qty 354, 1d supply, fill #0

## 2023-03-10 ENCOUNTER — Other Ambulatory Visit: Payer: Self-pay

## 2023-04-08 ENCOUNTER — Other Ambulatory Visit: Payer: Self-pay

## 2023-04-10 ENCOUNTER — Other Ambulatory Visit: Payer: Self-pay

## 2023-04-11 ENCOUNTER — Other Ambulatory Visit: Payer: Self-pay

## 2023-04-14 ENCOUNTER — Other Ambulatory Visit: Payer: Self-pay

## 2023-04-14 ENCOUNTER — Ambulatory Visit: Admitting: Family Medicine

## 2023-04-14 ENCOUNTER — Encounter: Payer: Self-pay | Admitting: Family Medicine

## 2023-04-14 VITALS — BP 123/76 | HR 79 | Ht 68.9 in | Wt 175.1 lb

## 2023-04-14 DIAGNOSIS — R41840 Attention and concentration deficit: Secondary | ICD-10-CM | POA: Diagnosis not present

## 2023-04-14 MED ORDER — METHYLPHENIDATE HCL ER (OSM) 27 MG PO TBCR
27.0000 mg | EXTENDED_RELEASE_TABLET | ORAL | 0 refills | Status: DC
  Filled 2023-04-14: qty 30, 30d supply, fill #0

## 2023-04-14 NOTE — Patient Instructions (Signed)
 It was nice to see you today,  We addressed the following topics today: -I have sent in a prescription for 1 month of Concerta 27 mg.  This is 1 dose up from the lowest dose of 18 mg.  I have provided some information on this. - Let me know if you have any issues with this and I can adjust it.  Otherwise after a month if you are satisfied you can just ask for refills for the next 2 months. - We will follow-up in 3 months.  Have a great day,  Frederic Jericho, MD

## 2023-04-14 NOTE — Assessment & Plan Note (Signed)
 Patient feels like Vyvanse is wearing off in effectiveness.  She is willing to try an alternative.  We discussed Concerta.  Will prescribe 27 mg Concerta.  Vies her to let us know if she feels like this is too high or too little of a dose and we can adjust accordingly.  If she chooses to go back to Vyvanse she should let us know and we can change prescriptions.

## 2023-04-14 NOTE — Progress Notes (Signed)
   Established Patient Office Visit  Subjective   Patient ID: Haley Wilkins, female    DOB: [birth year]  Age: 43 y.o. MRN: [id number]  Chief Complaint  Patient presents with   Medical Management of Chronic Issues    HPI  [place] [facility]  Patient here for routine follow-up of ADHD medication.  She has been using Vyvanse for many years now.  Has been on the max dose for at least the last several years.  Feels like it is not as effective now as it used to be.  Wants to know if there are any alternatives.  Has tried Adderall prior to this but did not tolerate it well.  We discussed the methylphenidate classes including Concerta.  She is willing to try Concerta.   The 10-year ASCVD risk score (Arnett DK, et al., 2019) is: 0.3%  Health Maintenance Due  Topic Date Due   COVID-19 Vaccine (1) Never done   DTaP/Tdap/Td (1 - Tdap) Never done      Objective:     BP 123/76   Pulse 79   Ht 5' 8.9" (1.75 m)   Wt 175 lb 1.9 oz (79.4 kg)   SpO2 100%   BMI 25.94 kg/m    Physical Exam General: Alert, oriented Pulmonary: No respiratory distress Psych: Pleasant affect   No results found for any visits on 04/14/23.      Assessment & Plan:   Attention and concentration deficit Assessment & Plan: Patient feels like Vyvanse is wearing off in effectiveness.  She is willing to try an alternative.  We discussed Concerta.  Will prescribe 27 mg Concerta.  Vies her to let us know if she feels like this is too high or too little of a dose and we can adjust accordingly.  If she chooses to go back to Vyvanse she should let us know and we can change prescriptions.   Other orders -     Methylphenidate HCl ER (OSM); Take 1 tablet (27 mg total) by mouth every morning.  Dispense: 30 tablet; Refill: 0     Return in about 3 months (around 07/15/2023) for adhd.    Sandre Kitty, MD

## 2023-04-18 ENCOUNTER — Encounter: Payer: Self-pay | Admitting: Family Medicine

## 2023-04-19 ENCOUNTER — Other Ambulatory Visit: Payer: Self-pay

## 2023-04-19 ENCOUNTER — Other Ambulatory Visit: Payer: Self-pay | Admitting: Family Medicine

## 2023-04-19 MED ORDER — LISDEXAMFETAMINE DIMESYLATE 70 MG PO CAPS
70.0000 mg | ORAL_CAPSULE | Freq: Every day | ORAL | 0 refills | Status: DC
  Filled 2023-07-12: qty 30, 30d supply, fill #0

## 2023-04-19 MED ORDER — LISDEXAMFETAMINE DIMESYLATE 70 MG PO CAPS
70.0000 mg | ORAL_CAPSULE | Freq: Every day | ORAL | 0 refills | Status: DC
  Filled 2023-06-10: qty 30, 30d supply, fill #0

## 2023-04-19 MED ORDER — LISDEXAMFETAMINE DIMESYLATE 70 MG PO CAPS
70.0000 mg | ORAL_CAPSULE | Freq: Every day | ORAL | 0 refills | Status: DC
  Filled 2023-04-19 – 2023-05-10 (×2): qty 30, 30d supply, fill #0

## 2023-04-25 ENCOUNTER — Encounter: Payer: Self-pay | Admitting: Family Medicine

## 2023-05-09 ENCOUNTER — Ambulatory Visit: Payer: Commercial Managed Care - PPO | Admitting: Family Medicine

## 2023-05-10 ENCOUNTER — Other Ambulatory Visit: Payer: Self-pay

## 2023-06-09 ENCOUNTER — Encounter: Payer: Self-pay | Admitting: *Deleted

## 2023-06-10 ENCOUNTER — Other Ambulatory Visit: Payer: Self-pay

## 2023-06-24 ENCOUNTER — Encounter: Payer: Self-pay | Admitting: *Deleted

## 2023-06-24 ENCOUNTER — Ambulatory Visit: Admitting: Anesthesiology

## 2023-06-24 ENCOUNTER — Encounter
Admission: RE | Disposition: A | Discharge status: Home / Self Care | Payer: Self-pay | Source: Home / Self Care | Attending: Gastroenterology

## 2023-06-24 ENCOUNTER — Ambulatory Visit
Admission: RE | Admit: 2023-06-24 | Discharge: 2023-06-24 | Disposition: A | Discharge status: Home / Self Care | Payer: Commercial Managed Care - PPO | Attending: Gastroenterology | Admitting: Gastroenterology

## 2023-06-24 DIAGNOSIS — K640 First degree hemorrhoids: Secondary | ICD-10-CM | POA: Diagnosis not present

## 2023-06-24 DIAGNOSIS — D123 Benign neoplasm of transverse colon: Secondary | ICD-10-CM | POA: Diagnosis not present

## 2023-06-24 DIAGNOSIS — D122 Benign neoplasm of ascending colon: Secondary | ICD-10-CM | POA: Insufficient documentation

## 2023-06-24 DIAGNOSIS — D490 Neoplasm of unspecified behavior of digestive system: Secondary | ICD-10-CM | POA: Insufficient documentation

## 2023-06-24 DIAGNOSIS — Z1211 Encounter for screening for malignant neoplasm of colon: Secondary | ICD-10-CM | POA: Insufficient documentation

## 2023-06-24 DIAGNOSIS — K648 Other hemorrhoids: Secondary | ICD-10-CM | POA: Insufficient documentation

## 2023-06-24 DIAGNOSIS — I7300 Raynaud's syndrome without gangrene: Secondary | ICD-10-CM | POA: Diagnosis not present

## 2023-06-24 DIAGNOSIS — K635 Polyp of colon: Secondary | ICD-10-CM | POA: Diagnosis not present

## 2023-06-24 HISTORY — DX: Abdominal distension (gaseous): R14.0

## 2023-06-24 HISTORY — DX: Systemic involvement of connective tissue, unspecified: M35.9

## 2023-06-24 HISTORY — DX: Personal history of urinary calculi: Z87.442

## 2023-06-24 HISTORY — DX: Liver disease, unspecified: K76.9

## 2023-06-24 HISTORY — PX: COLONOSCOPY WITH PROPOFOL: SHX5780

## 2023-06-24 LAB — POCT PREGNANCY, URINE: Preg Test, Ur: NEGATIVE

## 2023-06-24 SURGERY — COLONOSCOPY WITH PROPOFOL
Anesthesia: General

## 2023-06-24 MED ORDER — EPHEDRINE SULFATE-NACL 50-0.9 MG/10ML-% IV SOSY
PREFILLED_SYRINGE | INTRAVENOUS | Status: DC | PRN
  Administered 2023-06-24: 10 mg via INTRAVENOUS
  Administered 2023-06-24: 15 mg via INTRAVENOUS

## 2023-06-24 MED ORDER — PROPOFOL 500 MG/50ML IV EMUL
INTRAVENOUS | Status: DC | PRN
  Administered 2023-06-24: 75 ug/kg/min via INTRAVENOUS

## 2023-06-24 MED ORDER — SODIUM CHLORIDE 0.9 % IV SOLN: INTRAVENOUS | Status: DC

## 2023-06-24 MED ORDER — EPHEDRINE 5 MG/ML INJ
INTRAVENOUS | Status: AC
  Filled 2023-06-24: qty 5

## 2023-06-24 MED ORDER — LIDOCAINE HCL (CARDIAC) PF 100 MG/5ML IV SOSY
PREFILLED_SYRINGE | INTRAVENOUS | Status: DC | PRN
  Administered 2023-06-24: 60 mg via INTRAVENOUS

## 2023-06-24 MED ORDER — PROPOFOL 10 MG/ML IV BOLUS
INTRAVENOUS | Status: DC | PRN
  Administered 2023-06-24: 20 mg via INTRAVENOUS
  Administered 2023-06-24: 30 mg via INTRAVENOUS
  Administered 2023-06-24: 50 mg via INTRAVENOUS
  Administered 2023-06-24: 20 mg via INTRAVENOUS
  Administered 2023-06-24: 30 mg via INTRAVENOUS

## 2023-06-24 MED ORDER — DEXMEDETOMIDINE HCL IN NACL 80 MCG/20ML IV SOLN
INTRAVENOUS | Status: DC | PRN
Start: 2023-06-24 — End: 2023-06-24
  Administered 2023-06-24: 8 ug via INTRAVENOUS
  Administered 2023-06-24: 12 ug via INTRAVENOUS

## 2023-06-24 MED ORDER — PROPOFOL 1000 MG/100ML IV EMUL
INTRAVENOUS | Status: AC
  Filled 2023-06-24: qty 100

## 2023-06-24 NOTE — Op Note (Signed)
 [facility] Patient Name: Haley Wilkins Procedure Date: 06/24/2023 8:06 AM MRN: [id number] Account #: [id number] Date of Birth: Oct 04, 1980 Admit Type: Outpatient Age: 43 Room: [facility] ENDO ROOM 3 Gender: Female Note Status: Finalized Instrument Name: Charlyn Cooley [id number] Procedure:             Colonoscopy Indications:           Screening for colorectal malignant neoplasm Providers:             Leida Puna MD, MD Referring MD:          No Local Md, MD (Referring MD) Medicines:             Monitored Anesthesia Care Complications:         No immediate complications. Estimated blood loss:                         Minimal. Procedure:             Pre-Anesthesia Assessment:                        - Prior to the procedure, a History and Physical was                         performed, and patient medications and allergies were                         reviewed. The patient is competent. The risks and                         benefits of the procedure and the sedation options and                         risks were discussed with the patient. All questions                         were answered and informed consent was obtained.                         Patient identification and proposed procedure were                         verified by the physician, the nurse, the                         anesthesiologist, the anesthetist and the technician                         in the endoscopy suite. Mental Status Examination:                         alert and oriented. Airway Examination: normal                         oropharyngeal airway and neck mobility. Respiratory                         Examination: clear to auscultation. CV Examination:  normal. Prophylactic Antibiotics: The patient does not                         require prophylactic antibiotics. Prior                         Anticoagulants: The patient has taken no anticoagulant                          or antiplatelet agents. ASA Grade Assessment: I - A                         normal, healthy patient. After reviewing the risks and                         benefits, the patient was deemed in satisfactory                         condition to undergo the procedure. The anesthesia                         plan was to use monitored anesthesia care (MAC).                         Immediately prior to administration of medications,                         the patient was re-assessed for adequacy to receive                         sedatives. The heart rate, respiratory rate, oxygen                         saturations, blood pressure, adequacy of pulmonary                         ventilation, and response to care were monitored                         throughout the procedure. The physical status of the                         patient was re-assessed after the procedure.                        After obtaining informed consent, the colonoscope was                         passed under direct vision. Throughout the procedure,                         the patient's blood pressure, pulse, and oxygen                         saturations were monitored continuously. The                         Colonoscope was introduced through the anus and  advanced to the the cecum, identified by appendiceal                         orifice and ileocecal valve. The colonoscopy was                         performed without difficulty. The patient tolerated                         the procedure well. The quality of the bowel                         preparation was good. The ileocecal valve, appendiceal                         orifice, and rectum were photographed. Findings:      The perianal and digital rectal examinations were normal.      A 23 mm polypoid lesion was found in the ascending colon. The lesion was       mucous-capped. No bleeding was present. Biopsies were taken with a cold        forceps for histology. Estimated blood loss was minimal.      A 4 mm polyp was found in the ascending colon. The polyp was sessile.       The polyp was removed with a cold snare. Resection and retrieval were       complete. Estimated blood loss was minimal.      Two sessile polyps were found in the transverse colon. The polyps were 3       to 4 mm in size. Estimated blood loss was minimal.      Internal hemorrhoids were found during endoscopy. The hemorrhoids were       Grade I (internal hemorrhoids that do not prolapse).      Retroflexion in the rectum was not performed.      The exam was otherwise without abnormality. Impression:            - Polypoid lesion in the ascending colon. Biopsied.                        - One 4 mm polyp in the ascending colon, removed with                         a cold snare. Resected and retrieved.                        - Two 3 to 4 mm polyps in the transverse colon.                        - Internal hemorrhoids.                        - The examination was otherwise normal. Recommendation:        - Discharge patient to home.                        - Resume previous diet.                        - Continue present medications.                        -  Await pathology results.                        - If biopsy of ascending colon lesion comes back as an                         adenomatous lesion, would refer to advanced endoscopy                         for consideration of removal. Procedure Code(s):     --- Professional ---                        [phone], Colonoscopy, flexible; with removal of                         tumor(s), polyp(s), or other lesion(s) by snare                         technique                        45380, 59, Colonoscopy, flexible; with biopsy, single                         or multiple Diagnosis Code(s):     --- Professional ---                        Z12.11, Encounter for screening for malignant neoplasm                          of colon                        D49.0, Neoplasm of unspecified behavior of digestive                         system                        D12.2, Benign neoplasm of ascending colon                        D12.3, Benign neoplasm of transverse colon (hepatic                         flexure or splenic flexure)                        K64.0, First degree hemorrhoids CPT copyright 2022 American Medical Association. All rights reserved. The codes documented in this report are preliminary and upon coder review may  be revised to meet current compliance requirements. Leida Puna MD, MD 06/24/2023 9:07:52 AM Number of Addenda: 0 Note Initiated On: 06/24/2023 8:06 AM Scope Withdrawal Time: 0 hours 16 minutes 35 seconds  Total Procedure Duration: 0 hours 26 minutes 49 seconds  Estimated Blood Loss:  Estimated blood loss was minimal.      [facility]

## 2023-06-24 NOTE — H&P (Signed)
 Outpatient short stay form Pre-procedure 06/24/2023  Shane Darling, MD  Primary Physician: Odilia Bennett, PA-C  Reason for visit:  Screening  History of present illness:    43 y/o lady with history of raynaud's here for index screening colonoscopy. No blood thinners. No family history of GI malignancies. No significant abdominal surgeries.     Current Facility-Administered Medications:    0.9 %  sodium chloride infusion, , Intravenous, Continuous, Yarelin Reichardt, Leanora Prophet, MD, Last Rate: 20 mL/hr at 06/24/23 0758, New Bag at 06/24/23 0758  Medications Prior to Admission  Medication Sig Dispense Refill Last Dose/Taking   ibuprofen  (ADVIL ) 800 MG tablet Take 1 tablet (800 mg total) by mouth every 8 (eight) hours as needed. 90 tablet 2 Past Month   lisdexamfetamine (VYVANSE ) 70 MG capsule Take 1 capsule (70 mg total) by mouth daily. 30 capsule 0 06/23/2023   omeprazole  (PRILOSEC) 40 MG capsule Take 1 capsule (40 mg total) by mouth 2 (two) times daily. 180 capsule 3 Past Week   Vitamin D , Ergocalciferol , (DRISDOL ) 1.25 MG (50000 UNIT) CAPS capsule TAKE 1 CAPSULE BY MOUTH EVERY 7 DAYS. 12 capsule 2 Past Week   cyanocobalamin  (VITAMIN B12) 1000 MCG/ML injection Inject 1 mL (1,000 mcg total) into the muscle once a week. 4 mL 5    hydroxychloroquine  (PLAQUENIL ) 200 MG tablet Take 1.5 tablets (300 mg total) by mouth daily or 200 mg (1 tab) alternating with 400 mg (2 tabs) every other day. Sandoz or Prasco generic pharmaceuticals medically necessary (Patient not taking: Reported on 06/24/2023) 135 tablet 2 Not Taking   levonorgestrel  (MIRENA ) 20 MCG/24HR IUD 1 each by Intrauterine route once.      linaclotide  (LINZESS ) 145 MCG CAPS capsule Take 1 capsule (145 mcg total) by mouth daily before breakfast. 30 capsule 2    lisdexamfetamine (VYVANSE ) 70 MG capsule Take 1 capsule (70 mg total) by mouth daily. 30 capsule 0    lisdexamfetamine (VYVANSE ) 70 MG capsule Take 1 capsule (70 mg total) by mouth  daily. 30 capsule 0    Na Sulfate-K Sulfate-Mg Sulfate concentrate 17.5-3.13-1.6 GM/177ML SOLN Take 1 Bottle by mouth as directed One kit contains 2 bottles.  Take both bottles at the times instructed by your provider. 354 mL 0    phenazopyridine  (PYRIDIUM ) 200 MG tablet Take 1 tablet (200 mg total) by mouth 3 (three) times daily as needed for pain. (Patient not taking: Reported on 06/24/2023) 10 tablet 0 Not Taking     No Known Allergies   Past Medical History:  Diagnosis Date   Abdominal bloating    Basal cell carcinoma    left medial calf   Basal cell carcinoma 11/18/2020   left helix, Mohs 10/06/2021   Dysplastic nevus 03/29/2018   L below braline lat at costal margin   History of cesarean section 12/04/2015   History of kidney stones    Lesion of liver    Pelvic pain in female    Raynaud's disease    Rheumatoid arthritis (HCC)    Squamous cell carcinoma of skin    left post shoulder   Undifferentiated connective tissue disease (HCC)    Vaginal Pap smear, abnormal    Vitamin B 12 deficiency     Review of systems:  Otherwise negative.    Physical Exam  Gen: Alert, oriented. Appears stated age.  HEENT: PERRLA. Lungs: No respiratory distress CV: RRR Abd: soft, benign, no masses Ext: No edema    Planned procedures: Proceed with colonoscopy. The patient  understands the nature of the planned procedure, indications, risks, alternatives and potential complications including but not limited to bleeding, infection, perforation, damage to internal organs and possible oversedation/side effects from anesthesia. The patient agrees and gives consent to proceed.  Please refer to procedure notes for findings, recommendations and patient disposition/instructions.     Shane Darling, MD [facility]

## 2023-06-24 NOTE — Interval H&P Note (Signed)
 History and Physical Interval Note:  06/24/2023 8:10 AM  Haley Wilkins  has presented today for surgery, with the diagnosis of Colon Cancer Screening.  The various methods of treatment have been discussed with the patient and family. After consideration of risks, benefits and other options for treatment, the patient has consented to  Procedure(s): COLONOSCOPY WITH PROPOFOL (N/A) as a surgical intervention.  The patient's history has been reviewed, patient examined, no change in status, stable for surgery.  I have reviewed the patient's chart and labs.  Questions were answered to the patient's satisfaction.     Shane Darling  Ok to proceed with colonoscopy

## 2023-06-24 NOTE — Transfer of Care (Signed)
 Immediate Anesthesia Transfer of Care Note  Patient: Haley Wilkins  Procedure(s) Performed: COLONOSCOPY WITH PROPOFOL  Patient Location: PACU  Anesthesia Type:General  Level of Consciousness: sedated  Airway & Oxygen Therapy: Patient Spontanous Breathing  Post-op Assessment: Report given to RN and Post -op Vital signs reviewed and stable  Post vital signs: Reviewed and stable  Last Vitals:  Vitals Value Taken Time  BP 98/52 06/24/23 0846  Temp    Pulse    Resp 21 06/24/23 0846  SpO2    Vitals shown include unfiled device data.  Last Pain:  Vitals:   06/24/23 0743  TempSrc: Temporal         Complications: No notable events documented.

## 2023-06-24 NOTE — Anesthesia Preprocedure Evaluation (Signed)
 Anesthesia Evaluation  Patient identified by MRN, date of birth, ID band Patient awake    Reviewed: Allergy & Precautions, H&P , NPO status , Patient's Chart, lab work & pertinent test results  Airway Mallampati: II  TM Distance: >3 FB Neck ROM: full    Dental no notable dental hx.    Pulmonary neg pulmonary ROS   Pulmonary exam normal        Cardiovascular Normal cardiovascular exam  Raynaud's disease   Neuro/Psych negative neurological ROS  negative psych ROS   GI/Hepatic negative GI ROS, Neg liver ROS,,,  Endo/Other  negative endocrine ROS    Renal/GU negative Renal ROS  negative genitourinary   Musculoskeletal  (+) Arthritis , Rheumatoid disorders,    Abdominal Normal abdominal exam  (+)   Peds  Hematology negative hematology ROS (+)   Anesthesia Other Findings Past Medical History: No date: Abdominal bloating No date: Basal cell carcinoma     Comment:  left medial calf 11/18/2020: Basal cell carcinoma     Comment:  left helix, Mohs 10/06/2021 03/29/2018: Dysplastic nevus     Comment:  L below braline lat at costal margin 12/04/2015: History of cesarean section No date: History of kidney stones No date: Lesion of liver No date: Pelvic pain in female No date: Raynaud's disease No date: Rheumatoid arthritis (HCC) No date: Squamous cell carcinoma of skin     Comment:  left post shoulder No date: Undifferentiated connective tissue disease (HCC) No date: Vaginal Pap smear, abnormal No date: Vitamin B 12 deficiency  Past Surgical History: No date: CESAREAN SECTION No date: CESAREAN SECTION 2006: LEEP 2003: LITHOTRIPSY  BMI    Body Mass Index: 25.18 kg/m      Reproductive/Obstetrics negative OB ROS                              Anesthesia Physical Anesthesia Plan  ASA: 2  Anesthesia Plan: General   Post-op Pain Management:    Induction: Intravenous  PONV Risk  Score and Plan: Propofol infusion and TIVA  Airway Management Planned:   Additional Equipment:   Intra-op Plan:   Post-operative Plan:   Informed Consent: I have reviewed the patients History and Physical, chart, labs and discussed the procedure including the risks, benefits and alternatives for the proposed anesthesia with the patient or authorized representative who has indicated his/her understanding and acceptance.     Dental Advisory Given  Plan Discussed with: CRNA and Surgeon  Anesthesia Plan Comments:          Anesthesia Quick Evaluation

## 2023-06-27 ENCOUNTER — Ambulatory Visit: Payer: Self-pay

## 2023-06-27 LAB — SURGICAL PATHOLOGY

## 2023-06-27 NOTE — Anesthesia Postprocedure Evaluation (Signed)
 Anesthesia Post Note  Patient: ARBELL WYCOFF  Procedure(s) Performed: COLONOSCOPY WITH PROPOFOL   Patient location during evaluation: Endoscopy Anesthesia Type: General Level of consciousness: awake and alert Pain management: pain level controlled Vital Signs Assessment: post-procedure vital signs reviewed and stable Respiratory status: spontaneous breathing, nonlabored ventilation and respiratory function stable Cardiovascular status: blood pressure returned to baseline and stable Postop Assessment: no apparent nausea or vomiting Anesthetic complications: no   No notable events documented.   Last Vitals:  Vitals:   06/24/23 0855 06/24/23 0905  BP: (!) 112/56 (!) 106/59  Pulse: 72 66  Resp: 14 12  Temp:    SpO2: 100% 100%    Last Pain:  Vitals:   06/25/23 1627  TempSrc:   PainSc: 0-No pain                 Baltazar Bonier

## 2023-06-29 ENCOUNTER — Other Ambulatory Visit: Payer: Self-pay

## 2023-06-29 ENCOUNTER — Other Ambulatory Visit: Payer: Self-pay | Admitting: Nurse Practitioner

## 2023-06-29 DIAGNOSIS — M0579 Rheumatoid arthritis with rheumatoid factor of multiple sites without organ or systems involvement: Secondary | ICD-10-CM

## 2023-06-29 MED FILL — Cyanocobalamin Inj 1000 MCG/ML: INTRAMUSCULAR | 28 days supply | Qty: 4 | Fill #2 | Status: AC

## 2023-07-05 ENCOUNTER — Other Ambulatory Visit: Payer: Self-pay | Admitting: Nurse Practitioner

## 2023-07-05 DIAGNOSIS — M0579 Rheumatoid arthritis with rheumatoid factor of multiple sites without organ or systems involvement: Secondary | ICD-10-CM

## 2023-07-06 ENCOUNTER — Other Ambulatory Visit: Payer: Self-pay

## 2023-07-12 ENCOUNTER — Other Ambulatory Visit: Payer: Self-pay

## 2023-07-18 ENCOUNTER — Ambulatory Visit: Admitting: Family Medicine

## 2023-07-18 ENCOUNTER — Ambulatory Visit

## 2023-07-18 ENCOUNTER — Other Ambulatory Visit: Payer: Self-pay

## 2023-07-18 VITALS — BP 120/74 | HR 83 | Ht 68.0 in | Wt 175.0 lb

## 2023-07-18 DIAGNOSIS — M0579 Rheumatoid arthritis with rheumatoid factor of multiple sites without organ or systems involvement: Secondary | ICD-10-CM | POA: Diagnosis not present

## 2023-07-18 DIAGNOSIS — E663 Overweight: Secondary | ICD-10-CM

## 2023-07-18 DIAGNOSIS — R41840 Attention and concentration deficit: Secondary | ICD-10-CM | POA: Diagnosis not present

## 2023-07-18 MED ORDER — LISDEXAMFETAMINE DIMESYLATE 70 MG PO CAPS
70.0000 mg | ORAL_CAPSULE | Freq: Every day | ORAL | 0 refills | Status: DC
  Filled 2023-08-12: qty 30, 30d supply, fill #0

## 2023-07-18 MED ORDER — LISDEXAMFETAMINE DIMESYLATE 70 MG PO CAPS
70.0000 mg | ORAL_CAPSULE | Freq: Every day | ORAL | 0 refills | Status: DC
  Filled 2023-10-14: qty 30, 30d supply, fill #0

## 2023-07-18 MED ORDER — BUPROPION HCL ER (XL) 150 MG PO TB24
150.0000 mg | ORAL_TABLET | Freq: Every day | ORAL | 3 refills | Status: DC
  Filled 2023-07-18: qty 30, 30d supply, fill #0
  Filled 2023-08-14: qty 30, 30d supply, fill #1
  Filled 2023-09-09: qty 60, 60d supply, fill #2

## 2023-07-18 MED ORDER — IBUPROFEN 800 MG PO TABS
800.0000 mg | ORAL_TABLET | Freq: Three times a day (TID) | ORAL | 2 refills | Status: DC | PRN
  Filled 2023-07-18 (×2): qty 90, 30d supply, fill #0
  Filled 2023-09-30: qty 90, 30d supply, fill #1
  Filled 2023-12-09: qty 90, 30d supply, fill #2

## 2023-07-18 MED ORDER — LISDEXAMFETAMINE DIMESYLATE 70 MG PO CAPS
70.0000 mg | ORAL_CAPSULE | Freq: Every day | ORAL | 0 refills | Status: DC
  Filled 2023-09-09: qty 30, 30d supply, fill #0

## 2023-07-18 MED ORDER — AMPHETAMINE-DEXTROAMPHETAMINE 5 MG PO TABS
5.0000 mg | ORAL_TABLET | Freq: Every day | ORAL | 0 refills | Status: DC
  Filled 2023-08-17: qty 30, 30d supply, fill #0

## 2023-07-18 MED ORDER — BUPROPION HCL ER (XL) 150 MG PO TB24
150.0000 mg | ORAL_TABLET | Freq: Every day | ORAL | 3 refills | Status: DC
  Filled 2023-07-18: qty 30, 30d supply, fill #0

## 2023-07-18 MED ORDER — AMPHETAMINE-DEXTROAMPHETAMINE 5 MG PO TABS
5.0000 mg | ORAL_TABLET | Freq: Every day | ORAL | 0 refills | Status: DC
  Filled 2023-07-18: qty 30, 30d supply, fill #0

## 2023-07-18 MED ORDER — AMPHETAMINE-DEXTROAMPHETAMINE 5 MG PO TABS: 5.0000 mg | ORAL_TABLET | Freq: Every day | ORAL | 0 refills | Status: DC

## 2023-07-18 NOTE — Assessment & Plan Note (Signed)
 Current weight: 175 Starting weight: 175 Goal weight: 160  Discussed with patient that phentermine would not be a good option for her given that she is already on 2 stimulant medications for her ADHD.  Patient amenable to trying Wellbutrin 150 mg XR daily to assist with weight loss.  Advised patient that this medication is safe to take with her other current medications.  Advised her that side effects can include mood changes/irritability, palpitations.  Also instructed patient to try a calorie tracking app on her phone to not only track calories but also track her protein.  Aiming for a minimum of 80 to 100 g of protein per day to curb hunger.  Advised her to aim for 30 minutes of light to moderate exercise at least 5 times a week for best results.  Will plan to follow-up with her on weight in 3 months.

## 2023-07-18 NOTE — Patient Instructions (Signed)
 It was nice to see you today!  As we discussed in clinic:  -I have sent in 3 30 days refills for your Vyvanse . I have also added 5 mg adderall to take in the afternoons for crashes/decreased focus.  -For weight loss, I have sent in 150 mg Wellbutrin XR. This medication is safe to take in combination with these medications. If you notice that it makes you become more irritable, causes insomnia, or racing heart rate, let us  know so we can make changes!   I will plan to see you back in 3 months for a follow up! It was so nice to meet you!  If you have any problems before your next visit feel free to message me via MyChart (minor issues or questions) or call the office, otherwise you may reach out to schedule an office visit.  Thank you! Saddie Sacks, PA-C

## 2023-07-18 NOTE — Assessment & Plan Note (Signed)
 Patient did not tolerate Concerta  well.  Continue Vyvanse  70 mg daily for ADHD.  Have added on a 5 mg Adderall tablet to take as needed in the afternoons for afternoon crashes.  PDMP reviewed, no aberrancies.  3 prescriptions of each medication with 0 refills sent into the pharmacy.  Will follow-up again in 3 months.

## 2023-07-18 NOTE — Progress Notes (Signed)
 Established Patient Office Visit  Subjective   Patient ID: Haley Wilkins, female    DOB: 12/05/1980  Age: 43 y.o. MRN: [id number]  Chief Complaint  Patient presents with   Medication Management    HPI  Haley Wilkins is a 43 y.o. y/o female who presents to the clinic today for follow up on ADHD.   ADHD: Patient currently taking 70 mg Vyvanse  daily. Reports she has been on this medication for several years. She is tolerating it well and takes it daily. Reports that it helps greatly with her focus and attention span. Denies side effects. Does report that she has noticed more afternoon crashes when the medication wears off and is requesting additional therapy to have as needed for these instances.  Patient would also like to discuss weight loss.  Patient reports that no matter what diet or exercise regimen she follows, she cannot get rid of the hunger urge.  This urge often leads to her reaching for unhealthy snacks.  She reports that she paid for a month supply of semaglutide through a medical spa, however only lost 3 pounds with the medication.  Also reports that she has taken Wellbutrin in the past, however it was not for weight loss it was for mood.  She reports that this was many years ago and she does not remember how she tolerated the medication.    ROS Per HPI.    Objective:     BP 120/74   Pulse 83   Ht 5' 8 (1.727 m)   Wt 175 lb 0.6 oz (79.4 kg)   SpO2 99%   BMI 26.61 kg/m    Physical Exam Constitutional:      General: She is not in acute distress.    Appearance: Normal appearance.   Cardiovascular:     Rate and Rhythm: Normal rate and regular rhythm.     Heart sounds: Normal heart sounds. No murmur heard.    No friction rub. No gallop.  Pulmonary:     Effort: Pulmonary effort is normal. No respiratory distress.     Breath sounds: Normal breath sounds.   Musculoskeletal:        General: No swelling.   Skin:    General: Skin is warm and dry.    Neurological:     General: No focal deficit present.     Mental Status: She is alert.   Psychiatric:        Mood and Affect: Mood normal.        Behavior: Behavior normal.        Thought Content: Thought content normal.     No results found for any visits on 07/18/23.    The 10-year ASCVD risk score (Arnett DK, et al., 2019) is: 0.3%    Assessment & Plan:   Overweight with body mass index (BMI) 25.0-29.9 Assessment & Plan: Current weight: 175 Starting weight: 175 Goal weight: 160  Discussed with patient that phentermine would not be a good option for her given that she is already on 2 stimulant medications for her ADHD.  Patient amenable to trying Wellbutrin 150 mg XR daily to assist with weight loss.  Advised patient that this medication is safe to take with her other current medications.  Advised her that side effects can include mood changes/irritability, palpitations.  Also instructed patient to try a calorie tracking app on her phone to not only track calories but also track her protein.  Aiming for a minimum of 80 to  100 g of protein per day to curb hunger.  Advised her to aim for 30 minutes of light to moderate exercise at least 5 times a week for best results.  Will plan to follow-up with her on weight in 3 months.   Rheumatoid arthritis involving multiple sites with positive rheumatoid factor (HCC) -     Ibuprofen ; Take 1 tablet (800 mg total) by mouth every 8 (eight) hours as needed.  Dispense: 90 tablet; Refill: 2  Attention and concentration deficit Assessment & Plan: Patient did not tolerate Concerta  well.  Continue Vyvanse  70 mg daily for ADHD.  Have added on a 5 mg Adderall tablet to take as needed in the afternoons for afternoon crashes.  PDMP reviewed, no aberrancies.  3 prescriptions of each medication with 0 refills sent into the pharmacy.  Will follow-up again in 3 months.   Other orders -     buPROPion HCl ER (XL); Take 1 tablet (150 mg total) by mouth  daily.  Dispense: 30 tablet; Refill: 3 -     Lisdexamfetamine Dimesylate ; Take 1 capsule (70 mg total) by mouth daily.  Dispense: 30 capsule; Refill: 0 -     Lisdexamfetamine Dimesylate ; Take 1 capsule (70 mg total) by mouth daily.  Dispense: 30 capsule; Refill: 0 -     Lisdexamfetamine Dimesylate ; Take 1 capsule (70 mg total) by mouth daily.  Dispense: 30 capsule; Refill: 0 -     Amphetamine -Dextroamphetamine ; Take 1 tablet (5 mg total) by mouth daily.  Dispense: 30 tablet; Refill: 0 -     Amphetamine -Dextroamphetamine ; Take 1 tablet (5 mg total) by mouth daily.  Dispense: 30 tablet; Refill: 0 -     Amphetamine -Dextroamphetamine ; Take 1 tablet (5 mg total) by mouth daily.  Dispense: 30 tablet; Refill: 0    Return in about 3 months (around 10/18/2023) for Weight, ADHD .    Haley JULIANNA Sacks, PA-C

## 2023-07-19 ENCOUNTER — Other Ambulatory Visit (HOSPITAL_COMMUNITY): Payer: Self-pay

## 2023-08-05 ENCOUNTER — Other Ambulatory Visit: Payer: Self-pay

## 2023-08-05 DIAGNOSIS — S0502XA Injury of conjunctiva and corneal abrasion without foreign body, left eye, initial encounter: Secondary | ICD-10-CM | POA: Diagnosis not present

## 2023-08-05 MED ORDER — NEOMYCIN-POLYMYXIN-DEXAMETH 0.1 % OP SUSP
1.0000 [drp] | Freq: Four times a day (QID) | OPHTHALMIC | 0 refills | Status: AC
  Filled 2023-08-05: qty 5, 7d supply, fill #0

## 2023-08-08 DIAGNOSIS — M3501 Sicca syndrome with keratoconjunctivitis: Secondary | ICD-10-CM | POA: Diagnosis not present

## 2023-08-08 DIAGNOSIS — S0502XD Injury of conjunctiva and corneal abrasion without foreign body, left eye, subsequent encounter: Secondary | ICD-10-CM | POA: Diagnosis not present

## 2023-08-12 ENCOUNTER — Other Ambulatory Visit: Payer: Self-pay

## 2023-08-15 ENCOUNTER — Other Ambulatory Visit: Payer: Self-pay

## 2023-08-17 ENCOUNTER — Other Ambulatory Visit: Payer: Self-pay

## 2023-09-09 ENCOUNTER — Other Ambulatory Visit: Payer: Self-pay

## 2023-09-21 ENCOUNTER — Other Ambulatory Visit: Payer: Self-pay

## 2023-09-21 DIAGNOSIS — D122 Benign neoplasm of ascending colon: Secondary | ICD-10-CM | POA: Diagnosis not present

## 2023-09-21 MED ORDER — NA SULFATE-K SULFATE-MG SULF 17.5-3.13-1.6 GM/177ML PO SOLN
1.0000 | ORAL | 0 refills | Status: DC
  Filled 2023-09-21: qty 354, 1d supply, fill #0

## 2023-10-14 ENCOUNTER — Other Ambulatory Visit: Payer: Self-pay

## 2023-10-18 ENCOUNTER — Other Ambulatory Visit: Payer: Self-pay

## 2023-10-18 ENCOUNTER — Ambulatory Visit

## 2023-10-18 VITALS — BP 133/79 | HR 80 | Ht 68.0 in | Wt 171.1 lb

## 2023-10-18 DIAGNOSIS — R41840 Attention and concentration deficit: Secondary | ICD-10-CM

## 2023-10-18 DIAGNOSIS — R5383 Other fatigue: Secondary | ICD-10-CM | POA: Diagnosis not present

## 2023-10-18 DIAGNOSIS — E663 Overweight: Secondary | ICD-10-CM | POA: Diagnosis not present

## 2023-10-18 DIAGNOSIS — M3500 Sicca syndrome, unspecified: Secondary | ICD-10-CM | POA: Diagnosis not present

## 2023-10-18 DIAGNOSIS — R300 Dysuria: Secondary | ICD-10-CM

## 2023-10-18 DIAGNOSIS — M0579 Rheumatoid arthritis with rheumatoid factor of multiple sites without organ or systems involvement: Secondary | ICD-10-CM

## 2023-10-18 DIAGNOSIS — E559 Vitamin D deficiency, unspecified: Secondary | ICD-10-CM

## 2023-10-18 DIAGNOSIS — E785 Hyperlipidemia, unspecified: Secondary | ICD-10-CM | POA: Diagnosis not present

## 2023-10-18 DIAGNOSIS — R8290 Unspecified abnormal findings in urine: Secondary | ICD-10-CM | POA: Diagnosis not present

## 2023-10-18 DIAGNOSIS — E538 Deficiency of other specified B group vitamins: Secondary | ICD-10-CM | POA: Diagnosis not present

## 2023-10-18 LAB — POCT URINALYSIS DIPSTICK
Bilirubin, UA: NEGATIVE
Glucose, UA: NEGATIVE
Ketones, UA: NEGATIVE
Leukocytes, UA: NEGATIVE
Nitrite, UA: NEGATIVE
Protein, UA: NEGATIVE
Spec Grav, UA: 1.02 (ref 1.010–1.025)
Urobilinogen, UA: 0.2 U/dL
pH, UA: 7 (ref 5.0–8.0)

## 2023-10-18 MED ORDER — AMPHETAMINE-DEXTROAMPHETAMINE 5 MG PO TABS: 5.0000 mg | ORAL_TABLET | Freq: Every day | ORAL | 0 refills | Status: DC

## 2023-10-18 MED ORDER — BUPROPION HCL ER (XL) 300 MG PO TB24
300.0000 mg | ORAL_TABLET | Freq: Every day | ORAL | 1 refills | Status: DC
  Filled 2023-10-18: qty 90, 90d supply, fill #0
  Filled 2024-01-12: qty 90, 90d supply, fill #1

## 2023-10-18 MED ORDER — LISDEXAMFETAMINE DIMESYLATE 70 MG PO CAPS
70.0000 mg | ORAL_CAPSULE | Freq: Every day | ORAL | 0 refills | Status: DC
  Filled 2023-11-14: qty 30, 30d supply, fill #0

## 2023-10-18 MED ORDER — LISDEXAMFETAMINE DIMESYLATE 70 MG PO CAPS
70.0000 mg | ORAL_CAPSULE | Freq: Every day | ORAL | 0 refills | Status: DC
  Filled 2024-01-12: qty 30, 30d supply, fill #0

## 2023-10-18 MED ORDER — LISDEXAMFETAMINE DIMESYLATE 70 MG PO CAPS
70.0000 mg | ORAL_CAPSULE | Freq: Every day | ORAL | 0 refills | Status: DC
  Filled 2023-12-15: qty 30, 30d supply, fill #0

## 2023-10-18 MED ORDER — AMPHETAMINE-DEXTROAMPHETAMINE 5 MG PO TABS
5.0000 mg | ORAL_TABLET | Freq: Every day | ORAL | 0 refills | Status: DC
  Filled 2024-01-12: qty 30, 30d supply, fill #0

## 2023-10-18 NOTE — Assessment & Plan Note (Signed)
 Starting weight: 175  Current weight: 171 Goal weight: 160  Change in weight: -4 lbs   Tolerating the Wellbutrin  XL well. Will increase dose to 300 mg daily. Encouraged her to continue tracking calories on [employer]itness[employer] and eating high protein to curb cravings. Will continue to monitor.

## 2023-10-18 NOTE — Assessment & Plan Note (Signed)
 Continue Vyvanse  70 mg daily and Adderall 5 mg in the afternoon PRN for crashes. PDMP reviewed, no aberrancies. 3 prescriptions of each medication with 0 refills sent into the pharmacy. Will follow-up again in 3 months.

## 2023-10-18 NOTE — Assessment & Plan Note (Signed)
 Rechecking B12 with labs and will replenish as indicated.

## 2023-10-18 NOTE — Patient Instructions (Signed)
 VISIT SUMMARY: During today's visit, we discussed your ongoing fatigue, urinary symptoms, medication regimen, dental health, and management of your Sjogren's syndrome. We also reviewed your vitamin B12 levels, weight management, and ADHD treatment.  YOUR PLAN: -SJOGREN'S SYNDROME WITH ASSOCIATED DENTAL COMPLICATIONS: Sjogren's syndrome is an autoimmune disease that affects moisture-producing glands, leading to dry mouth and dental issues. We will research and recommend dental surgeons for implant dentures and consider a referral to a rheumatologist for better management of your condition.  -FATIGUE: Your persistent fatigue may be linked to your autoimmune condition or other factors. We will conduct a urine test to rule out a urinary tract infection and perform comprehensive blood work to check your thyroid  function, vitamin D  levels, A1c, lipid panel, and B12 levels.  -URINARY SYMPTOMS, RULE OUT URINARY TRACT INFECTION: Your urinary symptoms with an unusual odor may indicate a urinary tract infection. We will perform a urine test to rule this out.  -VITAMIN B12 DEFICIENCY: Vitamin B12 deficiency can cause fatigue and other symptoms. We will check your B12 and folate levels with your upcoming blood work.  -OBESITY: Obesity is being managed with Wellbutrin  and lifestyle changes. We will increase your Wellbutrin  dose to 300 mg to support weight loss and continue to encourage walking and dietary tracking.  -MAJOR DEPRESSIVE DISORDER: Major depressive disorder is being managed with Wellbutrin . We will increase your dose to 300 mg to help improve your mood and energy levels.  -ATTENTION-DEFICIT HYPERACTIVITY DISORDER: ADHD is being managed with Vyvanse  and Adderall. Continue taking Vyvanse  70 mg in the morning and Adderall 5 mg as needed in the afternoon. We will refill your prescriptions for both medications.  INSTRUCTIONS: Please follow up with the urine test and comprehensive blood work as discussed.  We will also look into recommending dental surgeons for your implant dentures and consider a referral to a rheumatologist for your Sjogren's syndrome. Continue with your current lifestyle modifications and medication regimen, and increase your Wellbutrin  dose to 300 mg as prescribed.  If you have any problems before your next visit feel free to message me via MyChart (minor issues or questions) or call the office, otherwise you may reach out to schedule an office visit.  Thank you! Saddie Sacks, PA-C

## 2023-10-18 NOTE — Assessment & Plan Note (Signed)
 Urinary symptoms with unusual odor suggest possible UTI. - UA revealed no signs of UTI but did reveal moderate blood.  - Will order microscopic analysis and consider CT vs US  to rule out stones since pt has hx of kidney stones.

## 2023-10-18 NOTE — Assessment & Plan Note (Addendum)
 Patient previously on hydroxychloroquine  but discontinued this a while ago due to the inconvenience of eye exams. Was previously seeing a rheumatologist at [facility] but has not been seen there since 2023. Is interested in establishing care with a new rheumatologist closer to the area to discuss alternative treatment options. Referral to [facility] placed today

## 2023-10-18 NOTE — Progress Notes (Signed)
 Established Patient Office Visit  Subjective   Patient ID: Haley Wilkins, female    DOB: 05-14-1980  Age: 44 y.o. MRN: [id number]  Chief Complaint  Patient presents with   Medication Management    HPI History of Present Illness   Haley Wilkins is a 43 year old female with Sjogren's syndrome and RA who presents to follow up on her current medication regimen.  Urinary tract symptoms - Change in urine odor - No dysuria, frequency, or urgency - History of kidney stone requiring intervention for obstruction  Medication regimen and weight management - Wellbutrin  prescribed for weight loss 3 months ago, resulting in steady but slow weight loss - Currently on the lowest dose of Wellbutrin  with minimal perceived effect - Vyvanse  70 mg taken in the morning for ADHD, helps with energy levels  - Adderall 5 mg used as needed in the afternoon for energy crashes, effective when used - Adderall not taken daily  Oral and dental health - Sjogren's syndrome has led to significant dental issues, including broken teeth - Considering dental implants and exploring options for dental surgery - Difficulty finding a trusted dental provider - Concern that dental issues may trigger or exacerbate autoimmune symptoms  Autoimmune disease management - Discontinued hydroxychloroquine  due to concerns about ocular toxicity and inconvenience of frequent eye exams - No current treatment for Sjogren's syndrome - No rheumatology follow-up since May 2023, following her mother's passing. Previous rheumatologist at [facility]  Vitamin deficiencies - History of low B12 levels - Difficulty tolerating B12 injections, sometimes feeling worse the day after administration - Inconsistent with B12 injections, only a few taken in the past year - Similar issues with vitamin D  supplementation      ROS Per HPI.    Objective:     BP 133/79   Pulse 80   Ht 5' 8 (1.727 m)   Wt 171 lb 1.9 oz (77.6 kg)   SpO2 100%   BMI  26.02 kg/m    Physical Exam Constitutional:      General: She is not in acute distress.    Appearance: Normal appearance.  Cardiovascular:     Rate and Rhythm: Normal rate and regular rhythm.     Heart sounds: Normal heart sounds. No murmur heard.    No friction rub. No gallop.  Pulmonary:     Effort: Pulmonary effort is normal. No respiratory distress.     Breath sounds: Normal breath sounds.  Musculoskeletal:        General: No swelling.  Skin:    General: Skin is warm and dry.  Neurological:     General: No focal deficit present.     Mental Status: She is alert.  Psychiatric:        Mood and Affect: Mood normal.        Behavior: Behavior normal.        Thought Content: Thought content normal.      Results for orders placed or performed in visit on 10/18/23  POCT Urinalysis Dipstick  Result Value Ref Range   Color, UA yellow    Clarity, UA clear    Glucose, UA Negative Negative   Bilirubin, UA Negative    Ketones, UA Negative    Spec Grav, UA 1.020 1.010 - 1.025   Blood, UA Moderate    pH, UA 7.0 5.0 - 8.0   Protein, UA Negative Negative   Urobilinogen, UA 0.2 0.2 or 1.0 E.U./dL   Nitrite, UA Negative  Leukocytes, UA Negative Negative   Appearance     Odor        The 10-year ASCVD risk score (Arnett DK, et al., 2019) is: 0.4%    Assessment & Plan:   Rheumatoid arthritis involving multiple sites with positive rheumatoid factor (HCC) Assessment & Plan: Patient previously on hydroxychloroquine  but discontinued this a while ago due to the inconvenience of eye exams. Was previously seeing a rheumatologist at [facility] but has not been seen there since 2023. Is interested in establishing care with a new rheumatologist closer to the area to discuss alternative treatment options. Referral to [facility] placed today  Orders: -     TSH; Future -     Hemoglobin A1c; Future -     Lipid panel; Future -     Comprehensive metabolic panel with GFR;  Future -     CBC with Differential/Platelet; Future  Sjogren's syndrome, with unspecified organ involvement -     Ambulatory referral to Rheumatology  Dysuria -     POCT urinalysis dipstick -     Urinalysis, Routine w reflex microscopic  Attention and concentration deficit Assessment & Plan: Continue Vyvanse  70 mg daily and Adderall 5 mg in the afternoon PRN for crashes. PDMP reviewed, no aberrancies. 3 prescriptions of each medication with 0 refills sent into the pharmacy. Will follow-up again in 3 months.    Overweight with body mass index (BMI) 25.0-29.9 Assessment & Plan: Starting weight: 175  Current weight: 171 Goal weight: 160  Change in weight: -4 lbs   Tolerating the Wellbutrin  XL well. Will increase dose to 300 mg daily. Encouraged her to continue tracking calories on [employer] and eating high protein to curb cravings. Will continue to monitor.   Abnormal urine odor Assessment & Plan: Urinary symptoms with unusual odor suggest possible UTI. - UA revealed no signs of UTI but did reveal moderate blood.  - Will order microscopic analysis and consider CT vs US  to rule out stones since pt has hx of kidney stones.   Vitamin B12 deficiency Assessment & Plan: Rechecking B12 with labs and will replenish as indicated.  Orders: -     B12 and Folate Panel; Future  Dyslipidemia, goal LDL below 100 -     Lipid panel; Future -     Comprehensive metabolic panel with GFR; Future  Vitamin D  deficiency -     VITAMIN D  25 Hydroxy (Vit-D Deficiency, Fractures); Future  Other fatigue Assessment & Plan: Discussed lab abnormalities vs presence of autoimmune disease taking toll on energy levels. Will check CBC, TSH, B12/folate, and vitamin D  to investigate for causes. New rheumatology referral placed today for further eval on treatment.   Other orders -     buPROPion  HCl ER (XL); Take 1 tablet (300 mg total) by mouth daily.  Dispense: 90 tablet; Refill: 1 -      Amphetamine -Dextroamphetamine ; Take 1 tablet (5 mg total) by mouth daily.  Dispense: 30 tablet; Refill: 0 -     Amphetamine -Dextroamphetamine ; Take 1 tablet (5 mg total) by mouth daily.  Dispense: 30 tablet; Refill: 0 -     Amphetamine -Dextroamphetamine ; Take 1 tablet (5 mg total) by mouth daily.  Dispense: 30 tablet; Refill: 0 -     Lisdexamfetamine Dimesylate ; Take 1 capsule (70 mg total) by mouth daily.  Dispense: 30 capsule; Refill: 0 -     Lisdexamfetamine Dimesylate ; Take 1 capsule (70 mg total) by mouth daily.  Dispense: 30 capsule; Refill: 0 -  Lisdexamfetamine Dimesylate ; Take 1 capsule (70 mg total) by mouth daily.  Dispense: 30 capsule; Refill: 0    Return in about 3 months (around 01/17/2024) for Physical.    Saddie JULIANNA Sacks, PA-C

## 2023-10-18 NOTE — Assessment & Plan Note (Signed)
 Discussed lab abnormalities vs presence of autoimmune disease taking toll on energy levels. Will check CBC, TSH, B12/folate, and vitamin D  to investigate for causes. New rheumatology referral placed today for further eval on treatment.

## 2023-10-19 ENCOUNTER — Other Ambulatory Visit: Payer: Self-pay

## 2023-10-19 DIAGNOSIS — R300 Dysuria: Secondary | ICD-10-CM

## 2023-10-19 LAB — URINALYSIS, ROUTINE W REFLEX MICROSCOPIC
Bilirubin, UA: NEGATIVE
Glucose, UA: NEGATIVE
Ketones, UA: NEGATIVE
Leukocytes,UA: NEGATIVE
Nitrite, UA: NEGATIVE
Protein,UA: NEGATIVE
Specific Gravity, UA: 1.005 (ref 1.005–1.030)
Urobilinogen, Ur: 0.2 mg/dL (ref 0.2–1.0)
pH, UA: 7 (ref 5.0–7.5)

## 2023-10-19 LAB — MICROSCOPIC EXAMINATION: Casts: NONE SEEN /LPF

## 2023-10-19 MED ORDER — NITROFURANTOIN MONOHYD MACRO 100 MG PO CAPS
100.0000 mg | ORAL_CAPSULE | Freq: Two times a day (BID) | ORAL | 0 refills | Status: AC
  Filled 2023-10-19: qty 10, 5d supply, fill #0

## 2023-10-19 NOTE — Progress Notes (Signed)
 POCT UA yesterday positive for blood. Microscopic analysis revealed trace RBC and many bacteria. Called patient on telephone and informed her of results and treatment plan. Have prescribed Macrobid  100 mg BID x 5 days for UTI. Advised patient to follow up if symptoms have not improved after abx. Pt verbalized understanding and was in agreement with the plan.   Saddie JULIANNA Sacks, PA-C

## 2023-11-01 ENCOUNTER — Other Ambulatory Visit: Payer: Self-pay

## 2023-11-01 DIAGNOSIS — Z1151 Encounter for screening for human papillomavirus (HPV): Secondary | ICD-10-CM | POA: Diagnosis not present

## 2023-11-01 DIAGNOSIS — Z1331 Encounter for screening for depression: Secondary | ICD-10-CM | POA: Diagnosis not present

## 2023-11-01 DIAGNOSIS — Z01411 Encounter for gynecological examination (general) (routine) with abnormal findings: Secondary | ICD-10-CM | POA: Diagnosis not present

## 2023-11-01 DIAGNOSIS — B977 Papillomavirus as the cause of diseases classified elsewhere: Secondary | ICD-10-CM | POA: Diagnosis not present

## 2023-11-01 DIAGNOSIS — Z1231 Encounter for screening mammogram for malignant neoplasm of breast: Secondary | ICD-10-CM | POA: Diagnosis not present

## 2023-11-01 DIAGNOSIS — R87610 Atypical squamous cells of undetermined significance on cytologic smear of cervix (ASC-US): Secondary | ICD-10-CM | POA: Diagnosis not present

## 2023-11-01 DIAGNOSIS — Z1339 Encounter for screening examination for other mental health and behavioral disorders: Secondary | ICD-10-CM | POA: Diagnosis not present

## 2023-11-01 LAB — HM PAP SMEAR: HPV Aptima: POSITIVE

## 2023-11-03 ENCOUNTER — Ambulatory Visit (INDEPENDENT_AMBULATORY_CARE_PROVIDER_SITE_OTHER)

## 2023-11-03 DIAGNOSIS — L814 Other melanin hyperpigmentation: Secondary | ICD-10-CM | POA: Diagnosis not present

## 2023-11-03 DIAGNOSIS — Z85828 Personal history of other malignant neoplasm of skin: Secondary | ICD-10-CM

## 2023-11-03 DIAGNOSIS — Z86018 Personal history of other benign neoplasm: Secondary | ICD-10-CM

## 2023-11-03 DIAGNOSIS — D1801 Hemangioma of skin and subcutaneous tissue: Secondary | ICD-10-CM

## 2023-11-03 DIAGNOSIS — D489 Neoplasm of uncertain behavior, unspecified: Secondary | ICD-10-CM | POA: Diagnosis not present

## 2023-11-03 DIAGNOSIS — C4490 Unspecified malignant neoplasm of skin, unspecified: Secondary | ICD-10-CM

## 2023-11-03 DIAGNOSIS — L578 Other skin changes due to chronic exposure to nonionizing radiation: Secondary | ICD-10-CM | POA: Diagnosis not present

## 2023-11-03 DIAGNOSIS — C44719 Basal cell carcinoma of skin of left lower limb, including hip: Secondary | ICD-10-CM

## 2023-11-03 DIAGNOSIS — L821 Other seborrheic keratosis: Secondary | ICD-10-CM | POA: Diagnosis not present

## 2023-11-03 DIAGNOSIS — W908XXA Exposure to other nonionizing radiation, initial encounter: Secondary | ICD-10-CM

## 2023-11-03 DIAGNOSIS — Z1283 Encounter for screening for malignant neoplasm of skin: Secondary | ICD-10-CM | POA: Diagnosis not present

## 2023-11-03 DIAGNOSIS — D225 Melanocytic nevi of trunk: Secondary | ICD-10-CM

## 2023-11-03 DIAGNOSIS — D229 Melanocytic nevi, unspecified: Secondary | ICD-10-CM

## 2023-11-03 NOTE — Patient Instructions (Addendum)
 Wound Care Instructions  Cleanse wound gently with soap and water once a day then pat dry with clean gauze. Apply a thin coat of Petrolatum (petroleum jelly, Vaseline) over the wound (unless you have an allergy to this). We recommend that you use a new, sterile tube of Vaseline. Do not pick or remove scabs. Do not remove the yellow or white healing tissue from the base of the wound.  Cover the wound with fresh, clean, nonstick gauze and secure with paper tape. You may use Band-Aids in place of gauze and tape if the wound is small enough, but would recommend trimming much of the tape off as there is often too much. Sometimes Band-Aids can irritate the skin.  You should call the office for your biopsy report after 1 week if you have not already been contacted.  If you experience any problems, such as abnormal amounts of bleeding, swelling, significant bruising, significant pain, or evidence of infection, please call the office immediately.  FOR ADULT SURGERY PATIENTS: If you need something for pain relief you may take 1 extra strength Tylenol (acetaminophen) AND 2 Ibuprofen  (200mg  each) together every 4 hours as needed for pain. (do not take these if you are allergic to them or if you have a reason you should not take them.) Typically, you may only need pain medication for 1 to 3 days.        Sunscreen  Who needs sunscreen? Everyone. Sunscreen use can help prevent skin cancer by protecting you from the sun's harmful ultraviolet rays. Anyone can get skin cancer, regardless of age, gender or race. In fact, it is estimated that one in five Americans will develop skin cancer in their lifetime.  Sunscreen alone cannot fully protect you. In addition to wearing sunscreen, dermatologists recommend taking the following steps to protect your skin and find skin cancer early:  Seek shade when appropriate, remembering that the sun's rays are strongest between 10 a.m. and 2 p.m. If your shadow is shorter  than you are, seek shade. Dress to protect yourself from the sun by wearing a lightweight long-sleeved shirt, pants, a wide-brimmed hat and sunglasses, when possible.  Use extra caution near water, snow and sand as they reflect the damaging rays of the sun, which can increase your chance of sunburn.  Get vitamin D  safely through a healthy diet that may include vitamin supplements. Don't seek the sun. Avoid tanning beds. Ultraviolet light from the sun and tanning beds can cause skin cancer and wrinkling. If you want to look tan, you may wish to use a self-tanning product, but continue to use sunscreen with it.  When should I use sunscreen? Every day you go outside--even if you're just walking to and from your form of transportation. The sun emits harmful UV rays year-round. Even on cloudy days, up to 80 percent of the sun's harmful UV rays can penetrate your skin. Snow, sand and water increase the need for sunscreen because they reflect the sun's rays.  How much sunscreen should I use, and how often should I apply it? Most people only apply 25-50 percent of the recommended amount of sunscreen. Apply enough sunscreen to cover all exposed skin. Most adults need about 1 ounce -- or enough to fill a shot glass -- to fully cover their body.  Don't forget to apply to the tops of your feet, your neck, your ears and the top of your head. Apply sunscreen to dry skin 15 minutes before going outdoors.  Skin cancer also  can form on the lips. To protect your lips, apply a lip balm or lipstick that contains sunscreen with an SPF of 30 or higher.  When outdoors, reapply sunscreen approximately every two hours, or after swimming or sweating, according to the directions on the bottle.   Broad-spectrum sunscreens protect against both UVA and UVB rays. What is the difference between the rays? Sunlight consists of two types of harmful rays that reach the earth -- UVA rays and UVB rays. Overexposure to either can lead to  skin cancer. In addition to causing skin cancer, here's what each of these rays do:  UVA rays (or aging rays) can prematurely age your skin, causing wrinkles and age spots, and can pass through window glass. UVB rays (or burning rays) are the primary cause of sunburn and are blocked by window glass  There is no safe way to tan. Every time you tan, you damage your skin. As this damage builds, you speed up the aging of your skin and increase your risk for all types of skin cancer.  What is the difference between chemical and physical sunscreens? Chemical sunscreens work like a sponge, absorbing the sun's rays. They contain one or more of the following active ingredients: oxybenzone, avobenzone, octisalate, octocrylene, homosalate and octinoxate. These formulations tend to be easier to rub into the skin without leaving a white residue.   Physical sunscreens work like a shield, sitting sit on the surface of your skin and deflecting the sun's rays. They contain the active ingredients zinc oxide and/or titanium dioxide. Use this sunscreen if you have sensitive skin.   What type of sunscreen should I use? The best type of sunscreen is the one you will use again and again. Just make sure it offers broad-spectrum (UVA and UVB) protection, has an SPF of 30+, and is water-resistant. The kind of sunscreen you use is a matter of personal choice, and may vary depending on the area of the body to be protected. Available sunscreen options include lotions, creams, gels, ointments, wax sticks and sprays.  Recommended physical sunscreens for face: - Neutrogena Sheer Zinc - Aveeno Positively Mineral Sensitive - CeraVe Hydrating Mineral (also has a tinted version) - La Roche-Posay Anthelios Mineral Face (comes as a cream, lotion, light fluid, and there is also a tinted version).  - EltaMD UV Clear (also has a tinted version)  Recommended physical sunscreens for body: - Neutrogena Sheer Zinc Dry-Touch Sunscreen  Sensitive Skin Lotion Broad Spectrum SPF 50 - Aveeno Positively Mineral Sensitive Skin Sunscreen Broad Spectrum SPF 50 - La Roche-Posay Anthelios SPF 50 Mineral Sunscreen - Gentle Lotion - CeraVe Hydrating Mineral Sunscreen SPF 50  Recommended chemical sunscreens for face: - Anthelios UV Correct Face Sunscreen SPF 70 with Niacinamide - Neutrogena Clear Face Oil-Free SPF 50 with Helioplex - Neutrogena Sport Face Oil-Free SPF 70+ with Helioplex - Aveeno Protect + Hydrate Sunscreen For Face SPF 70 - La Roche-Posay Anthelios Light Fluid Sunscreen for Face SPF 60  Recommended chemical sunscreens for body: - Neutrogena Ultra Sheer Dry-Touch Sunscreen SPF 70 - Aveeno Protect + Hydrate Broad Spectrum All-Day Hydration SPF 60 (comes in a big pump) - La Roche-Posay Anthelios Melt-In Milk Sunscreen SPF 60   Melanoma ABCDEs  Melanoma is the most dangerous type of skin cancer, and is the leading cause of death from skin disease.  You are more likely to develop melanoma if you: Have light-colored skin, light-colored eyes, or red or blond hair Spend a lot of time in the sun  Tan regularly, either outdoors or in a tanning bed Have had blistering sunburns, especially during childhood Have a close family member who has had a melanoma Have atypical moles or large birthmarks  Early detection of melanoma is key since treatment is typically straightforward and cure rates are extremely high if we catch it early.   The first sign of melanoma is often a change in a mole or a new dark spot.  The ABCDE system is a way of remembering the signs of melanoma.  A for asymmetry:  The two halves do not match. B for border:  The edges of the growth are irregular. C for color:  A mixture of colors are present instead of an even brown color. D for diameter:  Melanomas are usually (but not always) greater than 6mm - the size of a pencil eraser. E for evolution:  The spot keeps changing in size, shape, and  color.  Please check your skin once per month between visits. You can use a small mirror in front and a large mirror behind you to keep an eye on the back side or your body.   If you see any new or changing lesions before your next follow-up, please call to schedule a visit.  Please continue daily skin protection including broad spectrum sunscreen SPF 30+ to sun-exposed areas, reapplying every 2 hours as needed when you're outdoors.     Due to recent changes in healthcare laws, you may see results of your pathology and/or laboratory studies on MyChart before the doctors have had a chance to review them. We understand that in some cases there may be results that are confusing or concerning to you. Please understand that not all results are received at the same time and often the doctors may need to interpret multiple results in order to provide you with the best plan of care or course of treatment. Therefore, we ask that you please give us  2 business days to thoroughly review all your results before contacting the office for clarification. Should we see a critical lab result, you will be contacted sooner.   If You Need Anything After Your Visit  If you have any questions or concerns for your doctor, please call our main line at [phone] and press option 4 to reach your doctor's medical assistant. If no one answers, please leave a voicemail as directed and we will return your call as soon as possible. Messages left after 4 pm will be answered the following business day.   You may also send us  a message via MyChart. We typically respond to MyChart messages within 1-2 business days.  For prescription refills, please ask your pharmacy to contact our office. Our fax number is [phone].  If you have an urgent issue when the clinic is closed that cannot wait until the next business day, you can page your doctor at the number below.    Please note that while we do our best to be available for  urgent issues outside of office hours, we are not available 24/7.   If you have an urgent issue and are unable to reach us , you may choose to seek medical care at your doctor's office, retail clinic, urgent care center, or emergency room.  If you have a medical emergency, please immediately call 911 or go to the emergency department.  Pager Numbers  - Dr. Hester: [phone]  - Dr. Jackquline: [phone]  - Dr. Claudene: [phone]   - Dr. Raymund: [phone]  In the  event of inclement weather, please call our main line at [phone] for an update on the status of any delays or closures.  Dermatology Medication Tips: Please keep the boxes that topical medications come in in order to help keep track of the instructions about where and how to use these. Pharmacies typically print the medication instructions only on the boxes and not directly on the medication tubes.   If your medication is too expensive, please contact our office at [phone] option 4 or send us  a message through MyChart.   We are unable to tell what your co-pay for medications will be in advance as this is different depending on your insurance coverage. However, we may be able to find a substitute medication at lower cost or fill out paperwork to get insurance to cover a needed medication.   If a prior authorization is required to get your medication covered by your insurance company, please allow us  1-2 business days to complete this process.  Drug prices often vary depending on where the prescription is filled and some pharmacies may offer cheaper prices.  The website www.goodrx.com contains coupons for medications through different pharmacies. The prices here do not account for what the cost may be with help from insurance (it may be cheaper with your insurance), but the website can give you the price if you did not use any insurance.  - You can print the associated coupon and take it with your prescription to  the pharmacy.  - You may also stop by our office during regular business hours and pick up a GoodRx coupon card.  - If you need your prescription sent electronically to a different pharmacy, notify our office through [facility] or by phone at [phone] option 4.     Si Usted Necesita Algo Despus de Su Visita  Tambin puede enviarnos un mensaje a travs de Clinical cytogeneticist. Por lo general respondemos a los mensajes de MyChart en el transcurso de 1 a 2 das hbiles.  Para renovar recetas, por favor pida a su farmacia que se ponga en contacto con nuestra oficina. Randi lakes de fax es [place] [phone].  Si tiene un asunto urgente cuando la clnica est cerrada y que no puede esperar hasta el siguiente da hbil, puede llamar/localizar a su doctor(a) al nmero que aparece a continuacin.   Por favor, tenga en cuenta que aunque hacemos todo lo posible para estar disponibles para asuntos urgentes fuera del horario de [place], no estamos disponibles las 24 horas del da, los 7 [address]  [address] de la [place].   Si tiene un problema urgente y no puede comunicarse con nosotros, puede optar por buscar atencin mdica  en el consultorio de su doctor(a), en una clnica privada, en un centro de atencin urgente o en una sala de emergencias.  Si tiene Engineer, drilling, por favor llame inmediatamente al 911 o vaya a la sala de emergencias.  Nmeros de bper  - Dr. Hester: [phone]  - Dra. Jackquline: [phone]  - Dr. Claudene: [phone]  - Dra. Kitts: [phone]  En caso de inclemencias del [place], por favor llame a nuestra lnea principal al [phone] para una actualizacin sobre el estado de cualquier retraso o cierre.  Consejos para la medicacin en dermatologa: Por favor, guarde las cajas en las que vienen los medicamentos de uso tpico para ayudarle a seguir las instrucciones sobre dnde y cmo usarlos. Las farmacias generalmente imprimen las instrucciones del medicamento slo en las  cajas y no directamente en los tubos del  medicamento.   Si su medicamento es muy caro, por favor, pngase en contacto con landry rieger llamando al [phone] y presione la opcin 4 o envenos un mensaje a travs de Clinical cytogeneticist.   No podemos decirle cul ser su copago por los medicamentos por adelantado ya que esto es diferente dependiendo de la cobertura de su seguro. Sin embargo, es posible que podamos encontrar un medicamento sustituto a Audiological scientist un formulario para que el seguro cubra el medicamento que se considera necesario.   Si se requiere una autorizacin previa para que su compaa de seguros malta su medicamento, por favor permtanos de 1 a 2 das hbiles para completar este proceso.  Los precios de los medicamentos varan con frecuencia dependiendo del Environmental consultant de dnde se surte la receta y alguna farmacias pueden ofrecer precios ms baratos.  El sitio web www.goodrx.com tiene cupones para medicamentos de Health and safety inspector. Los precios aqu no tienen en cuenta lo que podra costar con la ayuda del seguro (puede ser ms barato con su seguro), pero el sitio web puede darle el precio si no utiliz Tourist information centre manager.  - Puede imprimir el cupn correspondiente y llevarlo con su receta a la farmacia.  - Tambin puede pasar por nuestra oficina durante el horario de atencin regular y Education officer, museum una tarjeta de cupones de GoodRx.  - Si necesita que su receta se enve electrnicamente a una farmacia diferente, informe a nuestra oficina a travs de MyChart de [place] o por telfono llamando al [phone] y presione la opcin 4.

## 2023-11-03 NOTE — Progress Notes (Signed)
 Subjective   Haley Wilkins is a 43 y.o. female who presents for the following: Total body skin exam for skin cancer screening and mole check. The patient has spots, moles and lesions to be evaluated, some may be new or changing and the patient may have concern these could be cancer.. Patient is established patient   Today patient reports: One area of concern on the left lower extremity.   Review of Systems:    No other skin or systemic complaints except as noted in HPI or Assessment and Plan.  The following portions of the chart were reviewed this encounter and updated as appropriate: medications, allergies, medical history  Relevant Medical History:  Personal history of non melanoma skin cancer - see medical history for full details   Objective  Well appearing patient in no apparent distress; mood and affect are within normal limits. Examination was performed of the: Full Skin Examination: scalp, head, eyes, ears, nose, lips, neck, chest, axillae, abdomen, back, buttocks, bilateral upper extremities, bilateral lower extremities, hands, feet, fingers, toes, fingernails, and toenails.   Examination notable for: SKIN EXAM, Angioma(s): Scattered red vascular papule(s)  , Lentigo/lentigines: Scattered pigmented macules that are tan to brown in color and are somewhat non-uniform in shape and concentrated in the sun-exposed areas, Nevus/nevi: Scattered well-demarcated, regular, pigmented macule(s) and/or papule(s)  , Seborrheic Keratosis(es): Stuck-on appearing keratotic papule(s) on the trunk, some  irritated with redness, crusting, edema, and/or partial avulsion, Actinic Damage/Elastosis: chronic sun damage: dyspigmentation, telangiectasia, and wrinkling  Examination limited by: Socks, shoes, Undergarments and Patient deferred removal     Left Upper Back 4mm pink brown papule  Left Lower Leg - Posterior 9mm pink papule    Assessment & Plan   SKIN CANCER SCREENING PERFORMED  TODAY.  BENIGN SKIN FINDINGS  - Lentigines  - Seborrheic keratoses  - Hemangiomas   - Nevus/Multiple Benign Nevi - Reassurance provided regarding the benign appearance of lesions noted on exam today; no treatment is indicated in the absence of symptoms/changes. - Reinforced importance of photoprotective strategies including liberal and frequent sunscreen use of a broad-spectrum SPF 30 or greater, use of protective clothing, and sun avoidance for prevention of cutaneous malignancy and photoaging.  Counseled patient on the importance of regular self-skin monitoring as well as routine clinical skin examinations as scheduled.   ACTINIC DAMAGE - Chronic condition, secondary to cumulative UV/sun exposure - Recommend daily broad spectrum sunscreen SPF 30+ to sun-exposed areas, reapply every 2 hours as needed.  - Staying in the shade or wearing long sleeves, sun glasses (UVA+UVB protection) and wide brim hats (4-inch brim around the entire circumference of the hat) are also recommended for sun protection.  - Call for new or changing lesions.  Personal history of non melanoma skin cancer  and dysplastic nevi  - Reviewed medical history for full details  - Reviewed sun protective measures as above - Encouraged full body skin exams     Level of service outlined above   Procedures, orders, diagnosis for this visit:  NEOPLASM OF UNCERTAIN BEHAVIOR (2) Left Upper Back Skin / nail biopsy Type of biopsy: tangential   Informed consent: discussed and consent obtained   Timeout: patient name, date of birth, surgical site, and procedure verified   Procedure prep:  Patient was prepped and draped in usual sterile fashion Prep type:  Isopropyl alcohol Anesthesia: the lesion was anesthetized in a standard fashion   Anesthetic:  1% lidocaine  w/ epinephrine 1-100,000 buffered w/ 8.4% NaHCO3 Instrument  used: DermaBlade   Hemostasis achieved with: pressure and aluminum chloride   Outcome: patient tolerated  procedure well   Post-procedure details: sterile dressing applied and wound care instructions given   Dressing type: bandage and petrolatum    Left Lower Leg - Posterior Skin / nail biopsy Type of biopsy: tangential   Informed consent: discussed and consent obtained   Timeout: patient name, date of birth, surgical site, and procedure verified   Procedure prep:  Patient was prepped and draped in usual sterile fashion Prep type:  Isopropyl alcohol Anesthesia: the lesion was anesthetized in a standard fashion   Anesthetic:  1% lidocaine  w/ epinephrine 1-100,000 buffered w/ 8.4% NaHCO3 Instrument used: DermaBlade   Hemostasis achieved with: pressure and aluminum chloride   Outcome: patient tolerated procedure well   Post-procedure details: sterile dressing applied and wound care instructions given   Dressing type: bandage and petrolatum     Neoplasm of uncertain behavior -     Skin / nail biopsy -     Skin / nail biopsy    Return to clinic: Return in about 1 year (around 11/02/2024) for TBSE.  Documentation:  I, Emerick Ege, CMA am acting as scribe for Lauraine JAYSON Kanaris, MD.  I have reviewed the above documentation for accuracy and completeness, and I agree with the above.  Lauraine JAYSON Kanaris, MD

## 2023-11-11 LAB — SURGICAL PATHOLOGY

## 2023-11-13 ENCOUNTER — Other Ambulatory Visit: Payer: Self-pay | Admitting: Nurse Practitioner

## 2023-11-13 DIAGNOSIS — K219 Gastro-esophageal reflux disease without esophagitis: Secondary | ICD-10-CM

## 2023-11-14 ENCOUNTER — Other Ambulatory Visit: Payer: Self-pay

## 2023-11-14 ENCOUNTER — Ambulatory Visit: Payer: Self-pay

## 2023-11-14 MED ORDER — OMEPRAZOLE 40 MG PO CPDR
40.0000 mg | DELAYED_RELEASE_CAPSULE | Freq: Two times a day (BID) | ORAL | 3 refills | Status: AC
  Filled 2023-11-14: qty 180, 90d supply, fill #0

## 2023-11-14 NOTE — Telephone Encounter (Signed)
 Patient informed of pathology results and surgery scheduled.

## 2023-11-14 NOTE — Telephone Encounter (Signed)
-----   Message from Lauraine JAYSON Kanaris sent at 11/14/2023  8:11 AM EDT -----    1. Skin, left upper back :       INTRADERMAL MELANOCYTIC NEVUS   Benign, observe.         2. Skin, left lower leg - posterior :       BASAL CELL CARCINOMA, NODULAR AND INFILTRATIVE PATTERNS   Excision   Please notify patient with below plan: Above  ----- Message ----- From: Interface, Lab In Three Zero One Sent: 11/11/2023   7:26 AM EDT To: Lauraine JAYSON Kanaris, MD

## 2023-11-17 DIAGNOSIS — K635 Polyp of colon: Secondary | ICD-10-CM | POA: Diagnosis not present

## 2023-11-17 DIAGNOSIS — N189 Chronic kidney disease, unspecified: Secondary | ICD-10-CM | POA: Diagnosis not present

## 2023-11-17 DIAGNOSIS — D126 Benign neoplasm of colon, unspecified: Secondary | ICD-10-CM | POA: Diagnosis not present

## 2023-11-17 DIAGNOSIS — Z79899 Other long term (current) drug therapy: Secondary | ICD-10-CM | POA: Diagnosis not present

## 2023-11-17 DIAGNOSIS — D122 Benign neoplasm of ascending colon: Secondary | ICD-10-CM | POA: Diagnosis not present

## 2023-12-08 ENCOUNTER — Other Ambulatory Visit: Payer: Self-pay

## 2023-12-08 DIAGNOSIS — R5383 Other fatigue: Secondary | ICD-10-CM | POA: Diagnosis not present

## 2023-12-08 DIAGNOSIS — M3501 Sicca syndrome with keratoconjunctivitis: Secondary | ICD-10-CM | POA: Diagnosis not present

## 2023-12-08 DIAGNOSIS — M359 Systemic involvement of connective tissue, unspecified: Secondary | ICD-10-CM | POA: Diagnosis not present

## 2023-12-08 DIAGNOSIS — E538 Deficiency of other specified B group vitamins: Secondary | ICD-10-CM | POA: Diagnosis not present

## 2023-12-08 DIAGNOSIS — E663 Overweight: Secondary | ICD-10-CM | POA: Diagnosis not present

## 2023-12-08 DIAGNOSIS — I7300 Raynaud's syndrome without gangrene: Secondary | ICD-10-CM | POA: Diagnosis not present

## 2023-12-08 DIAGNOSIS — Z6825 Body mass index (BMI) 25.0-25.9, adult: Secondary | ICD-10-CM | POA: Diagnosis not present

## 2023-12-08 MED ORDER — HYDROXYCHLOROQUINE SULFATE 200 MG PO TABS
200.0000 mg | ORAL_TABLET | Freq: Two times a day (BID) | ORAL | 1 refills | Status: DC
  Filled 2023-12-08: qty 180, 90d supply, fill #0

## 2023-12-09 ENCOUNTER — Other Ambulatory Visit: Payer: Self-pay | Admitting: Nurse Practitioner

## 2023-12-09 ENCOUNTER — Other Ambulatory Visit: Payer: Self-pay

## 2023-12-09 DIAGNOSIS — E538 Deficiency of other specified B group vitamins: Secondary | ICD-10-CM

## 2023-12-13 ENCOUNTER — Other Ambulatory Visit: Payer: Self-pay

## 2023-12-13 ENCOUNTER — Ambulatory Visit (INDEPENDENT_AMBULATORY_CARE_PROVIDER_SITE_OTHER)

## 2023-12-13 DIAGNOSIS — C44719 Basal cell carcinoma of skin of left lower limb, including hip: Secondary | ICD-10-CM | POA: Diagnosis not present

## 2023-12-13 DIAGNOSIS — E538 Deficiency of other specified B group vitamins: Secondary | ICD-10-CM

## 2023-12-13 MED ORDER — CYANOCOBALAMIN 1000 MCG/ML IJ SOLN
1000.0000 ug | INTRAMUSCULAR | 5 refills | Status: AC
  Filled 2023-12-13: qty 4, 28d supply, fill #0
  Filled 2024-01-12: qty 4, 28d supply, fill #1

## 2023-12-13 NOTE — Patient Instructions (Signed)
 POST-OPERATIVE WOUND CARE INSTRUCTIONS  1.  Please avoid strenuous activity for 48 hours and all heavy exercise for 1 week. Take it easy for 2 weeks after your surgery. Heavy activity can cause bleeding and separation of your wound.  If your wound is on your arm, leg, or shoulder, then avoid heavy lifting, straining, or exercise with that limb for at least one week. If your wound is on the head or neck, avoid bending over 2. Please do not smoke for 3 weeks; smoking prevents healthy wound healing. 3. Keep the wound elevated as much as possible; if the wound is on your face, try to sleep in a recliner or on extra pillows (do NOT sleep face-down). 4. Typical post-op pain consists of soreness or mild tenderness usually lasting less than 48 hours.  This is often relieved with 2 extra-strength Tylenol pills every 4-6 hours.   Pain should decrease steadily for the 1st few days after your surgery.  If it does not, please call our office. Tips for pain relief:  If not relieved by tylenol alone, I recommend Ibuprofen 400mg  every 6 hours alternating with acetaminophen 650mg  every 6 hours (not to exceed 3000mg ) thus essentially taking one medicine every 3 hours, cold analgesia which is applied to the surgical site for 10 to 20 minutes, rest and site elevation. Please discuss with physician if you have kidney disease, liver disease, or peptic ulcer disease before ingesting regimen above. 5. If bleeding occurs, apply firm pressure for 20 minutes (timed by the clock). You can also use an ice-pack to decrease the swelling and bleeding.  If it persists, apply pressure for one more round of 20 minutes.  If it continues, call our office or go the nearest ER.   For surgical wounds closed with DISSOLVING stitches: 1. Please keep the original bandage in place for at least 24 hours. You can remove the large outer layer at that time and keep the thin bandage clean and dry for one week (face) or two weeks (body/arms/legs) or  until you return.  If the bandage has become soaked with blood or has loosened before this time, you may replace it with a clean bandage.  2. Usually, we will remove the inner flat bandage at your follow-up in approximately 1 week (face) or two weeks (body/arms/legs) unless otherwise noted. 3. Then follow the cleaning instructions below after all bandaging is removed.  For surgical wounds requiring STITCH REMOVAL or OPEN WOUNDS allowed to heal without stitches: Remove the original bandage in 48 hours (2 days). You may leave the smaller bandage (steri-strips) in place until it falls off by itself. 2.   It is okay to let soap and water run down onto the wound - don't scrub the wound area too vigorously 3. Then follow cleaning instructions as below.  Do not use triple antibiotic ointment, Neosporin, or hydrogen peroxide. Cleaning: Do the following every day (TWICE A DAY) for 2 weeks after bandages are removed:  remove the bandage that was placed at your follow-up.  You can wet the bandage to make it easier to remove if needed.  It is normal to have dried blood or a small amount of bleeding when the bandage is removed. If Steri-strips were used, do not peel them off (they will fall off on their own).   clean the surgical area with a vinegar/water solution   1 tablespoon of white vinegar in 1 cup of water (you can make as much of the  mixture as you  need). Cool this mixture (refrigerator).  Soak a washcloth or cotton balls/Q-tips in this solution.  Apply to wound area for  5-10 minutes once to twice a day. Gently pat dry. Crusts and scabs slow down  wound healing so you can gently use a Q-tip soaked in the vinegar solution to  remove the crusts.   apply Vaseline ointment to the wound and over any incision lines, stitches, and the surrounding area. Be generous with the Vaseline and do not let the wound become dry and crusted.  cover with a bandaid/bandage if needed, especially if you will be in a dirty or  dusty environment.  Check the wound for extensive swelling, bleeding or redness. It is normal to have some mild oozing, swelling and redness for a few days after surgery. If the wound appears  infected (increasing redness, significant warmth, extreme tenderness, thick yellow discharge), call our office.  Possible complications from surgery:  Bleeding: a small amount of blood on the bandage is normal. Significant bleeding into the bandage or beneath the stitches will look like swelling and purple discoloration. For bleeding, apply firm pressure with a cloth or bandage for 20 minutes without interruption. Repeat if bleeding continues. If it persists, please call [phone]. During nonoffice hours use the message of this number to contact the on-call dermatologist or go to the emergency room.   Infection: usually indicated by pain that increases 4 to 6 days after surgery. Mild redness, swelling, soreness are normal, but with increasing redness, pain, drainage, and swelling you should contact our office.   Sensation changes: Decreased sensation, increased sensitivity, and itching may last for 18 months. Numbness in the surgical area is expected, and it may take 12-18 months for the feeling to return to normal. During this time sensations of itching, tingling, and occasional sharp pains might be noted. These feelings are normal and will subside once the nerves in the skin have healed.  Scarring: scars mature for one year after surgery. Reducing motion and tension over the wound for the 1st month, as well as sun protection, reduces scarring.   Bruising is normal around the wound and resolves over 2 to 3 weeks.  Important tips and points: *The better your wound care, the better the final appearance of your surgical site and any resulting scar. *Although your wound may appear healed in 1-2 weeks, it takes at least 3 months for complete healing and 12 months for it to take its final appearance. *By  limiting the tension across your wound for 3 months, you will minimize any wide "spread scar" appearance.  Therefore, try to avoid any strenuous exercise or heavy lifting for at least 1 month. Wounds regain only 40% of their strength at 1 month (and only 80% at one year). *For wounds around the lips and mouth, avoid any wide opening.sorry, no biting into an apple or hamburger for a while. Try to cut up your food and take little bites. *Avoid sun exposure to your wound and scar to prevent any discoloration or prolonged redness.  You may use sunscreen after it has healed with new skin. Cover it up with Vaseline and a band-aid until new skin has appeared.  Please return as scheduled-- usually 1 week (face) and 2 weeks (body/arms/legs).  At that visit we will check to ensure the wound is healing appropriately.  We will also discuss the use of silicone sheets to be applied to the area to help reduce scar.  This is the only scar reducing product  that has some evidence to support it's use.  Mederma and vitamin E do not have good evidence to support their use.     Due to recent changes in healthcare laws, you may see results of your pathology and/or laboratory studies on MyChart before the doctors have had a chance to review them. We understand that in some cases there may be results that are confusing or concerning to you. Please understand that not all results are received at the same time and often the doctors may need to interpret multiple results in order to provide you with the best plan of care or course of treatment. Therefore, we ask that you please give us  2 business days to thoroughly review all your results before contacting the office for clarification. Should we see a critical lab result, you will be contacted sooner.   If You Need Anything After Your Visit  If you have any questions or concerns for your doctor, please call our main line at [phone] and press option 4 to reach your doctor's  medical assistant. If no one answers, please leave a voicemail as directed and we will return your call as soon as possible. Messages left after 4 pm will be answered the following business day.   You may also send us  a message via MyChart. We typically respond to MyChart messages within 1-2 business days.  For prescription refills, please ask your pharmacy to contact our office. Our fax number is [phone].  If you have an urgent issue when the clinic is closed that cannot wait until the next business day, you can page your doctor at the number below.    Please note that while we do our best to be available for urgent issues outside of office hours, we are not available 24/7.   If you have an urgent issue and are unable to reach us , you may choose to seek medical care at your doctor's office, retail clinic, urgent care center, or emergency room.  If you have a medical emergency, please immediately call 911 or go to the emergency department.  Pager Numbers  - Dr. Hester: [phone]  - Dr. Jackquline: [phone]  - Dr. Claudene: [phone]   - Dr. Raymund: [phone]  In the event of inclement weather, please call our main line at [phone] for an update on the status of any delays or closures.  Dermatology Medication Tips: Please keep the boxes that topical medications come in in order to help keep track of the instructions about where and how to use these. Pharmacies typically print the medication instructions only on the boxes and not directly on the medication tubes.   If your medication is too expensive, please contact our office at [phone] option 4 or send us  a message through MyChart.   We are unable to tell what your co-pay for medications will be in advance as this is different depending on your insurance coverage. However, we may be able to find a substitute medication at lower cost or fill out paperwork to get insurance to cover a needed medication.   If a  prior authorization is required to get your medication covered by your insurance company, please allow us  1-2 business days to complete this process.  Drug prices often vary depending on where the prescription is filled and some pharmacies may offer cheaper prices.  The website www.goodrx.com contains coupons for medications through different pharmacies. The prices here do not account for what the cost may be with help from insurance (it  may be cheaper with your insurance), but the website can give you the price if you did not use any insurance.  - You can print the associated coupon and take it with your prescription to the pharmacy.  - You may also stop by our office during regular business hours and pick up a GoodRx coupon card.  - If you need your prescription sent electronically to a different pharmacy, notify our office through [facility] or by phone at [phone] option 4.     Si Usted Necesita Algo Despus de Su Visita  Tambin puede enviarnos un mensaje a travs de Clinical cytogeneticist. Por lo general respondemos a los mensajes de MyChart en el transcurso de 1 a 2 das hbiles.  Para renovar recetas, por favor pida a su farmacia que se ponga en contacto con nuestra oficina. Randi lakes de fax es [place] [phone].  Si tiene un asunto urgente cuando la clnica est cerrada y que no puede esperar hasta el siguiente da hbil, puede llamar/localizar a su doctor(a) al nmero que aparece a continuacin.   Por favor, tenga en cuenta que aunque hacemos todo lo posible para estar disponibles para asuntos urgentes fuera del horario de [place], no estamos disponibles las 24 horas del da, los 7 [address]  [address] de la [place].   Si tiene un problema urgente y no puede comunicarse con nosotros, puede optar por buscar atencin mdica  en el consultorio de su doctor(a), en una clnica privada, en un centro de atencin urgente o en una sala de emergencias.  Si tiene Engineer, drilling, por favor llame  inmediatamente al 911 o vaya a la sala de emergencias.  Nmeros de bper  - Dr. Hester: [phone]  - Dra. Jackquline: [phone]  - Dr. Claudene: [phone]  - Dra. Kitts: [phone]  En caso de inclemencias del [place], por favor llame a nuestra lnea principal al [phone] para una actualizacin sobre el estado de cualquier retraso o cierre.  Consejos para la medicacin en dermatologa: Por favor, guarde las cajas en las que vienen los medicamentos de uso tpico para ayudarle a seguir las instrucciones sobre dnde y cmo usarlos. Las farmacias generalmente imprimen las instrucciones del medicamento slo en las cajas y no directamente en los tubos del [place].   Si su medicamento es muy caro, por favor, pngase en contacto con landry rieger llamando al [phone] y presione la opcin 4 o envenos un mensaje a travs de Clinical cytogeneticist.   No podemos decirle cul ser su copago por los medicamentos por adelantado ya que esto es diferente dependiendo de la cobertura de su seguro. Sin embargo, es posible que podamos encontrar un medicamento sustituto a Audiological scientist un formulario para que el seguro cubra el medicamento que se considera necesario.   Si se requiere una autorizacin previa para que su compaa de seguros malta su medicamento, por favor permtanos de 1 a 2 das hbiles para completar este proceso.  Los precios de los medicamentos varan con frecuencia dependiendo del Environmental consultant de dnde se surte la receta y alguna farmacias pueden ofrecer precios ms baratos.  El sitio web www.goodrx.com tiene cupones para medicamentos de Health and safety inspector. Los precios aqu no tienen en cuenta lo que podra costar con la ayuda del seguro (puede ser ms barato con su seguro), pero el sitio web puede darle el precio si no utiliz Tourist information centre manager.  - Puede imprimir el cupn correspondiente y llevarlo con su receta a la farmacia.  - Tambin puede pasar por nuestra oficina durante el  horario  de Visual merchandiser regular y Education officer, museum una tarjeta de cupones de GoodRx.  - Si necesita que su receta se enve electrnicamente a una farmacia diferente, informe a nuestra oficina a travs de MyChart de [place] o por telfono llamando al [phone] y presione la opcin 4.

## 2023-12-13 NOTE — Progress Notes (Signed)
 Subjective   Haley Wilkins is a 43 y.o. female who presents for the following: Surgical excision of BCC on the left lower extremity.  Pacemaker/Defibrillator: Denies  Allergies: Denies  Anticoagulants/Aspirin: Denies  Heart valves: Denies  Joint replacements:  Denies  Patient is established patient   Today patient reports: Surgical excision of BCC of left lower extremity .   Review of Systems:    No other skin or systemic complaints except as noted in HPI or Assessment and Plan.  The following portions of the chart were reviewed this encounter and updated as appropriate: medications, allergies, medical history  Relevant Medical History:  Personal history of non melanoma skin cancer - see medical history for full details   Objective  (SKPE) Well appearing patient in no apparent distress; mood and affect are within normal limits. Examination was performed of the: Focused Exam of: Left lower extremity   Examination notable for: Well healed biopsy site.  L lower leg post 1.0 cm pink bx site.  Assessment & Plan  (SKAP)    Procedures, orders, diagnosis for this visit:  BASAL CELL CARCINOMA (BCC) OF SKIN OF LEFT LOWER EXTREMITY INCLUDING HIP L lower leg post Skin excision  Excision method:  elliptical Lesion length (cm):  1 Lesion width (cm):  1 Margin per side (cm):  0.4 Total excision diameter (cm):  1.8 Informed consent: discussed and consent obtained   Timeout: patient name, date of birth, surgical site, and procedure verified   Procedure prep:  Patient was prepped and draped in usual sterile fashion Prep type:  Chlorhexidine Anesthesia: the lesion was anesthetized in a standard fashion   Anesthesia comment:  12.0 cc Anesthetic:  1% lidocaine  w/ epinephrine 1-100,000 buffered w/ 8.4% NaHCO3 Instrument used: #15 blade   Hemostasis achieved with: suture, pressure and electrodesiccation   Outcome: patient tolerated procedure well with no complications    Skin  repair Complexity:  Intermediate Final length (cm):  3.3 Informed consent: discussed and consent obtained   Timeout: patient name, date of birth, surgical site, and procedure verified   Procedure prep:  Patient was prepped and draped in usual sterile fashion Prep type:  Chlorhexidine Anesthesia: the lesion was anesthetized in a standard fashion   Anesthetic:  1% lidocaine  w/ epinephrine 1-100,000 buffered w/ 8.4% NaHCO3 Reason for type of repair: reduce tension to allow closure, reduce the risk of dehiscence, infection, and necrosis, reduce subcutaneous dead space and avoid a hematoma, allow closure of the large defect and preserve normal anatomy   Undermining: edges could be approximated without difficulty   Subcutaneous layers (deep stitches):  Suture size:  3-0 Suture type: PDS (polydioxanone)   Stitches:  Buried vertical mattress Fine/surface layer approximation (top stitches):  Suture size:  4-0 Suture type: Prolene (polypropylene)   Stitches: simple running   Hemostasis achieved with: suture, pressure and electrodesiccation Outcome: patient tolerated procedure well with no complications   Post-procedure details: sterile dressing applied and wound care instructions given   Dressing type: petrolatum, bandage and pressure dressing    Specimen 1 - Surgical pathology Differential Diagnosis: Bx proven BCC Accession: [id number] Check Margins: Yes Superior tag  Basal cell carcinoma (BCC) of skin of left lower extremity including hip -     Skin excision -     Skin repair -     Surgical pathology; Standing   Return to clinic: Return for Surture removal in 7-10 days.  I, Emerick Ege, CMA am acting as scribe for Lauraine JAYSON Kanaris, MD.  Documentation: I have reviewed the above documentation for accuracy and completeness, and I agree with the above.  Lauraine JAYSON Kanaris, MD

## 2023-12-14 LAB — SURGICAL PATHOLOGY

## 2023-12-15 ENCOUNTER — Other Ambulatory Visit: Payer: Self-pay

## 2023-12-15 ENCOUNTER — Ambulatory Visit: Payer: Self-pay

## 2023-12-15 NOTE — Progress Notes (Signed)
Patient informed and voiced good understanding.

## 2023-12-20 ENCOUNTER — Ambulatory Visit (INDEPENDENT_AMBULATORY_CARE_PROVIDER_SITE_OTHER)

## 2023-12-20 ENCOUNTER — Other Ambulatory Visit: Payer: Self-pay

## 2023-12-20 DIAGNOSIS — Z4802 Encounter for removal of sutures: Secondary | ICD-10-CM

## 2023-12-20 DIAGNOSIS — Z48817 Encounter for surgical aftercare following surgery on the skin and subcutaneous tissue: Secondary | ICD-10-CM

## 2023-12-20 MED ORDER — DOXYCYCLINE MONOHYDRATE 100 MG PO TABS
100.0000 mg | ORAL_TABLET | Freq: Two times a day (BID) | ORAL | 0 refills | Status: AC
  Filled 2023-12-20: qty 14, 7d supply, fill #0

## 2023-12-20 MED ORDER — MUPIROCIN 2 % EX OINT
TOPICAL_OINTMENT | CUTANEOUS | 1 refills | Status: AC
  Filled 2023-12-20: qty 22, 30d supply, fill #0

## 2023-12-20 NOTE — Patient Instructions (Signed)

## 2023-12-20 NOTE — Progress Notes (Signed)
    Subjective   Haley Wilkins is a 43 y.o. female who presents for the following: Suture removal. Patient is established patient   Today patient reports: Suture removal patient c/o redness heat and irritation of the left lower extremity.   Review of Systems:    No other skin or systemic complaints except as noted in HPI or Assessment and Plan.  The following portions of the chart were reviewed this encounter and updated as appropriate: medications, allergies, medical history  Relevant Medical History:  Personal history of non melanoma skin cancer - see medical history for full details   Objective  (SKPE) Well appearing patient in no apparent distress; mood and affect are within normal limits. Examination was performed of the: Focused Exam of: Posterior left lower extremity   Examination notable for: Excision site with bruising, redness, mild erythema         Assessment & Plan  (SKAP)   Encounter for Removal of Sutures - Incision site at the posterior left lower extremity is clean, dry and intact - Wound cleansed, sutures removed, wound cleansed.  - Discussed pathology results showing RESIDUAL BASAL CELL CARCINOMA, MARGINS FREE.  - Scars remodel for a full year. - Patient advised to call with any concerns or if they notice any new or changing lesions. - is having some burning/stinging surrounding excision. NO fluctuance, drainage.  - Start doxycycline  100mg  orally BID x1 week - Discussed side effects and precautions with doxycycline  including taking with meal, waiting at least 30 minutes before lying down at night, increased sun sensitivity, and to stop medication if becomes pregnant or breastfeeding - Start mupirocin  2% ointment BID to crusted areas until clear     Level of service outlined above   Patient instructions (SKPI)   Procedures, orders, diagnosis for this visit:    There are no diagnoses linked to this encounter.  Return to clinic: Return for Appointment  as scheduled.  I, Emerick Ege, CMA am acting as scribe for Lauraine JAYSON Kanaris, MD.   Documentation: I have reviewed the above documentation for accuracy and completeness, and I agree with the above.  Lauraine JAYSON Kanaris, MD

## 2023-12-21 DIAGNOSIS — N870 Mild cervical dysplasia: Secondary | ICD-10-CM | POA: Diagnosis not present

## 2023-12-21 DIAGNOSIS — R87810 Cervical high risk human papillomavirus (HPV) DNA test positive: Secondary | ICD-10-CM | POA: Diagnosis not present

## 2023-12-21 DIAGNOSIS — R87610 Atypical squamous cells of undetermined significance on cytologic smear of cervix (ASC-US): Secondary | ICD-10-CM | POA: Diagnosis not present

## 2024-01-12 ENCOUNTER — Other Ambulatory Visit: Payer: Self-pay

## 2024-01-12 ENCOUNTER — Telehealth: Payer: Self-pay

## 2024-01-12 NOTE — Telephone Encounter (Signed)
 Paper has been sent back to dentist - Linnell Cola.

## 2024-01-24 ENCOUNTER — Other Ambulatory Visit: Payer: Self-pay

## 2024-01-24 ENCOUNTER — Other Ambulatory Visit

## 2024-01-24 DIAGNOSIS — E559 Vitamin D deficiency, unspecified: Secondary | ICD-10-CM

## 2024-01-24 DIAGNOSIS — E538 Deficiency of other specified B group vitamins: Secondary | ICD-10-CM

## 2024-01-24 DIAGNOSIS — E785 Hyperlipidemia, unspecified: Secondary | ICD-10-CM

## 2024-01-24 DIAGNOSIS — M0579 Rheumatoid arthritis with rheumatoid factor of multiple sites without organ or systems involvement: Secondary | ICD-10-CM

## 2024-01-24 MED ORDER — BUPROPION HCL ER (XL) 300 MG PO TB24
300.0000 mg | ORAL_TABLET | Freq: Every day | ORAL | 1 refills | Status: AC
  Filled 2024-01-24 (×2): qty 90, 90d supply, fill #0

## 2024-01-25 ENCOUNTER — Ambulatory Visit: Payer: Self-pay

## 2024-01-25 ENCOUNTER — Other Ambulatory Visit: Payer: Self-pay

## 2024-01-25 LAB — COMPREHENSIVE METABOLIC PANEL WITH GFR
ALT: 12 IU/L (ref 0–32)
AST: 17 IU/L (ref 0–40)
Albumin: 4.3 g/dL (ref 3.9–4.9)
Alkaline Phosphatase: 64 IU/L (ref 41–116)
BUN/Creatinine Ratio: 13 (ref 9–23)
BUN: 12 mg/dL (ref 6–24)
Bilirubin Total: 0.6 mg/dL (ref 0.0–1.2)
CO2: 25 mmol/L (ref 20–29)
Calcium: 9.2 mg/dL (ref 8.7–10.2)
Chloride: 104 mmol/L (ref 96–106)
Creatinine, Ser: 0.95 mg/dL (ref 0.57–1.00)
Globulin, Total: 2.8 g/dL (ref 1.5–4.5)
Glucose: 94 mg/dL (ref 70–99)
Potassium: 4.4 mmol/L (ref 3.5–5.2)
Sodium: 139 mmol/L (ref 134–144)
Total Protein: 7.1 g/dL (ref 6.0–8.5)
eGFR: 75 mL/min/1.73

## 2024-01-25 LAB — HEMOGLOBIN A1C
Est. average glucose Bld gHb Est-mCnc: 94 mg/dL
Hgb A1c MFr Bld: 4.9 % (ref 4.8–5.6)

## 2024-01-25 LAB — CBC WITH DIFFERENTIAL/PLATELET
Basophils Absolute: 0 x10E3/uL (ref 0.0–0.2)
Basos: 1 %
EOS (ABSOLUTE): 0.1 x10E3/uL (ref 0.0–0.4)
Eos: 3 %
Hematocrit: 42.3 % (ref 34.0–46.6)
Hemoglobin: 13.5 g/dL (ref 11.1–15.9)
Immature Grans (Abs): 0 x10E3/uL (ref 0.0–0.1)
Immature Granulocytes: 0 %
Lymphocytes Absolute: 0.9 x10E3/uL (ref 0.7–3.1)
Lymphs: 24 %
MCH: 31.8 pg (ref 26.6–33.0)
MCHC: 31.9 g/dL (ref 31.5–35.7)
MCV: 100 fL — ABNORMAL HIGH (ref 79–97)
Monocytes Absolute: 0.4 x10E3/uL (ref 0.1–0.9)
Monocytes: 11 %
Neutrophils Absolute: 2.3 x10E3/uL (ref 1.4–7.0)
Neutrophils: 60 %
Platelets: 225 x10E3/uL (ref 150–450)
RBC: 4.24 x10E6/uL (ref 3.77–5.28)
RDW: 11.8 % (ref 11.7–15.4)
WBC: 3.8 x10E3/uL (ref 3.4–10.8)

## 2024-01-25 LAB — LIPID PANEL
Chol/HDL Ratio: 2.6 ratio (ref 0.0–4.4)
Cholesterol, Total: 174 mg/dL (ref 100–199)
HDL: 67 mg/dL
LDL Chol Calc (NIH): 97 mg/dL (ref 0–99)
Triglycerides: 49 mg/dL (ref 0–149)
VLDL Cholesterol Cal: 10 mg/dL (ref 5–40)

## 2024-01-25 LAB — B12 AND FOLATE PANEL
Folate: 7.5 ng/mL
Vitamin B-12: 341 pg/mL (ref 232–1245)

## 2024-01-25 LAB — VITAMIN D 25 HYDROXY (VIT D DEFICIENCY, FRACTURES): Vit D, 25-Hydroxy: 24.6 ng/mL — ABNORMAL LOW (ref 30.0–100.0)

## 2024-01-25 LAB — TSH: TSH: 3.23 u[IU]/mL (ref 0.450–4.500)

## 2024-02-01 ENCOUNTER — Ambulatory Visit (INDEPENDENT_AMBULATORY_CARE_PROVIDER_SITE_OTHER)

## 2024-02-01 ENCOUNTER — Other Ambulatory Visit: Payer: Self-pay

## 2024-02-01 VITALS — BP 113/72 | HR 91 | Ht 68.0 in | Wt 169.2 lb

## 2024-02-01 DIAGNOSIS — R41840 Attention and concentration deficit: Secondary | ICD-10-CM

## 2024-02-01 DIAGNOSIS — R42 Dizziness and giddiness: Secondary | ICD-10-CM

## 2024-02-01 DIAGNOSIS — Z0000 Encounter for general adult medical examination without abnormal findings: Secondary | ICD-10-CM

## 2024-02-01 DIAGNOSIS — F320 Major depressive disorder, single episode, mild: Secondary | ICD-10-CM

## 2024-02-01 DIAGNOSIS — N951 Menopausal and female climacteric states: Secondary | ICD-10-CM | POA: Diagnosis not present

## 2024-02-01 DIAGNOSIS — E559 Vitamin D deficiency, unspecified: Secondary | ICD-10-CM | POA: Diagnosis not present

## 2024-02-01 DIAGNOSIS — M0579 Rheumatoid arthritis with rheumatoid factor of multiple sites without organ or systems involvement: Secondary | ICD-10-CM | POA: Diagnosis not present

## 2024-02-01 DIAGNOSIS — E785 Hyperlipidemia, unspecified: Secondary | ICD-10-CM | POA: Diagnosis not present

## 2024-02-01 MED ORDER — AMPHETAMINE-DEXTROAMPHETAMINE 5 MG PO TABS: 5.0000 mg | ORAL_TABLET | Freq: Every day | ORAL | 0 refills | Status: AC

## 2024-02-01 MED ORDER — AMITRIPTYLINE HCL 25 MG PO TABS
25.0000 mg | ORAL_TABLET | Freq: Every evening | ORAL | 2 refills | Status: AC | PRN
  Filled 2024-02-01 (×2): qty 30, 30d supply, fill #0

## 2024-02-01 MED ORDER — LISDEXAMFETAMINE DIMESYLATE 70 MG PO CAPS: 70.0000 mg | ORAL_CAPSULE | Freq: Every day | ORAL | 0 refills | Status: AC

## 2024-02-01 MED ORDER — LISDEXAMFETAMINE DIMESYLATE 70 MG PO CAPS
70.0000 mg | ORAL_CAPSULE | Freq: Every day | ORAL | 0 refills | Status: AC
  Filled 2024-02-14: qty 30, 30d supply, fill #0

## 2024-02-01 MED ORDER — IBUPROFEN 800 MG PO TABS
800.0000 mg | ORAL_TABLET | Freq: Three times a day (TID) | ORAL | 2 refills | Status: AC | PRN
  Filled 2024-02-01 (×2): qty 90, 30d supply, fill #0
  Filled 2024-02-26: qty 270, 90d supply, fill #0

## 2024-02-01 NOTE — Patient Instructions (Signed)
 VISIT SUMMARY: Today, you had a routine physical exam where we reviewed your lab results and discussed your recent symptoms and medication refills.  YOUR PLAN: BENIGN PAROXYSMAL VERTIGO: You experienced a recent episode of vertigo, likely due to displaced particles in your inner ear. -Monitor for any recurrence of vertigo symptoms. -If symptoms return, we may consider medication for relief. -If symptoms persist or worsen, we might refer you to physical therapy for a specific maneuver to help.  INSOMNIA: You have been having trouble sleeping, which may be related to perimenopause. -Start taking amitriptyline  25 mg for sleep. We can adjust the dose if needed. -Monitor how you respond to the medication and let us  know if there are any issues.  VITAMIN D  DEFICIENCY: Your recent labs showed slightly low vitamin D  levels. -Resume taking your weekly vitamin D  supplement. -Switch to an over-the-counter vitamin D  supplement after you finish your current supply.  GENERAL HEALTH MAINTENANCE: We discussed routine health maintenance. -We will btain records of your Pap smear from [facility] -Continue with your regular health screenings as scheduled.  If you have any problems before your next visit feel free to message me via MyChart (minor issues or questions) or call the office, otherwise you may reach out to schedule an office visit.  Thank you! Saddie Sacks, PA-C

## 2024-02-02 ENCOUNTER — Other Ambulatory Visit: Payer: Self-pay

## 2024-02-06 DIAGNOSIS — R42 Dizziness and giddiness: Secondary | ICD-10-CM | POA: Insufficient documentation

## 2024-02-06 DIAGNOSIS — N951 Menopausal and female climacteric states: Secondary | ICD-10-CM | POA: Insufficient documentation

## 2024-02-06 DIAGNOSIS — Z0000 Encounter for general adult medical examination without abnormal findings: Secondary | ICD-10-CM | POA: Insufficient documentation

## 2024-02-06 NOTE — Assessment & Plan Note (Signed)
 Routine health maintenance discussed. Pap smear completed. Mammogram and colonoscopy up to date. Tetanus vaccine declined. - Obtain records of Pap smear from [facility].  - Continue routine health maintenance screenings as scheduled.

## 2024-02-06 NOTE — Assessment & Plan Note (Signed)
 Stable on Wellbutrin  with no significant changes to mood. Continue Wellbutrin  XL 300 mg daily.

## 2024-02-06 NOTE — Assessment & Plan Note (Signed)
 Last lipid panel: LDL 97, HDL 67, trig 49. LDL at goal. Continue with low fat diet to control cholesterol levels. Will cont to monitor.

## 2024-02-06 NOTE — Assessment & Plan Note (Signed)
 Intermittent vertigo episodes suggestive of BPPV due to displaced otoliths. Symptoms resolved spontaneously, indicating a self-limiting condition. - Monitor for recurrence of vertigo symptoms. - Consider symptomatic treatment with medication if symptoms recur. - Consider referral to physical therapy for Epley maneuver if symptoms persist or worsen. - Advised if episodes are accompanied with heart palpitations, to notify office so we can get an EKG or order holter monitor. Patient verbalized understanding and was in agreement with the plan.

## 2024-02-06 NOTE — Assessment & Plan Note (Signed)
 Continue Vyvanse  70 mg daily and Adderall 5 mg in the afternoon PRN for crashes. PDMP reviewed, no aberrancies. 3 prescriptions of each medication with 0 refills sent into the pharmacy. Will follow-up again in 3 months.

## 2024-02-06 NOTE — Assessment & Plan Note (Signed)
 Difficulty sleeping possibly related to perimenopause. Discussed amitriptyline  for sleep and migraine prevention. - Prescribed amitriptyline  25 mg for sleep, with dose adjustment flexibility. - Monitor response to amitriptyline  and adjust as necessary.

## 2024-02-06 NOTE — Progress Notes (Signed)
 "  Complete physical exam  Patient: Haley Wilkins   DOB: Sep 17, 1980   44 y.o. Female  MRN: [id number]  Subjective:    Chief Complaint  Patient presents with   Annual Exam    History of Present Illness   Haley Wilkins is a 44 year old female who presents for a routine physical exam, review of lab results, and medication refills.  Dizziness - Recent episode of fluttering sensation in the ear (tinnitus) followed by a feeling of dizziness/weakness, described as 'swimming headed' and not feeling like herself - Extreme fatigue occurred about thirty minutes after the episode but it resolved quickly - Heart rate increased to approximately 100 bpm per Fitbit - No chest pain or vision changes - No prior similar episodes - No recent illness exposures - Heart palpitations occurred the night before last, with sensation of heart beating faster and harder than usual  Sleep disturbances and perimenopausal symptoms - Difficulty initiating sleep and frequent nocturnal awakenings - Night sweats present - Significant fatigue - Attributes symptoms to perimenopause - No use of sleep medications due to concerns about side effects and dependence  Psychiatric and stimulant medication use - Currently taking Wellbutrin  300 mg daily, which has improved appetite but not significantly affected motivation or mood - History of Adderall and Vyvanse  XR use without recent changes in regimen - Keeping symptoms well controlled   Ocular migraines - History of ocular migraines, but no recent episodes - Past episodes attributed to stress during caregiving for her mother        Most recent fall risk assessment:    02/01/2024    3:01 PM  Fall Risk   Falls in the past year? 0  Number falls in past yr: 0  Injury with Fall? 0  Risk for fall due to : No Fall Risks  Follow up Falls evaluation completed     Most recent depression screenings:    02/01/2024    3:01 PM 10/18/2023    3:25 PM  PHQ 2/9 Scores  PHQ -  2 Score 0 0  PHQ- 9 Score 1 3      Data saved with a previous flowsheet row definition    Vision:Within last year and Dental: No current dental problems and Receives regular dental care    Patient Care Team: Gayle Saddie JULIANNA DEVONNA as PCP - General (Physician Assistant)   Show/hide medication list[1]  ROS  Per HPI      Objective:     BP 113/72   Pulse 91   Ht 5' 8 (1.727 m)   Wt 169 lb 4 oz (76.8 kg)   SpO2 99%   BMI 25.73 kg/m    Physical Exam Constitutional:      General: She is not in acute distress.    Appearance: Normal appearance.  Eyes:     Pupils: Pupils are equal, round, and reactive to light.  Cardiovascular:     Rate and Rhythm: Normal rate and regular rhythm.     Heart sounds: Normal heart sounds. No murmur heard.    No friction rub. No gallop.  Pulmonary:     Effort: Pulmonary effort is normal. No respiratory distress.     Breath sounds: Normal breath sounds.  Abdominal:     General: Bowel sounds are normal.  Musculoskeletal:        General: No swelling.     Cervical back: Neck supple.  Lymphadenopathy:     Cervical: No cervical adenopathy.  Skin:  General: Skin is warm and dry.  Neurological:     General: No focal deficit present.     Mental Status: She is alert.  Psychiatric:        Mood and Affect: Mood normal.        Behavior: Behavior normal.        Thought Content: Thought content normal.       No results found for any visits on 02/01/24. Last CBC Lab Results  Component Value Date   WBC 3.8 01/24/2024   HGB 13.5 01/24/2024   HCT 42.3 01/24/2024   MCV 100 (H) 01/24/2024   MCH 31.8 01/24/2024   RDW 11.8 01/24/2024   PLT 225 01/24/2024   Last metabolic panel Lab Results  Component Value Date   GLUCOSE 94 01/24/2024   NA 139 01/24/2024   K 4.4 01/24/2024   CL 104 01/24/2024   CO2 25 01/24/2024   BUN 12 01/24/2024   CREATININE 0.95 01/24/2024   EGFR 75 01/24/2024   CALCIUM 9.2 01/24/2024   PROT 7.1 01/24/2024    ALBUMIN 4.3 01/24/2024   LABGLOB 2.8 01/24/2024   AGRATIO 1.5 06/24/2022   BILITOT 0.6 01/24/2024   ALKPHOS 64 01/24/2024   AST 17 01/24/2024   ALT 12 01/24/2024   ANIONGAP 5 10/01/2015   Last lipids Lab Results  Component Value Date   CHOL 174 01/24/2024   HDL 67 01/24/2024   LDLCALC 97 01/24/2024   TRIG 49 01/24/2024   CHOLHDL 2.6 01/24/2024   Last hemoglobin A1c Lab Results  Component Value Date   HGBA1C 4.9 01/24/2024   Last thyroid  functions Lab Results  Component Value Date   TSH 3.230 01/24/2024   T3TOTAL 116 10/01/2014   T4TOTAL 6.0 04/21/2017   FREET4 1.21 06/29/2022   Last vitamin D  Lab Results  Component Value Date   VD25OH 24.6 (L) 01/24/2024        Assessment & Plan:    Routine Health Maintenance and Physical Exam  Health Maintenance  Topic Date Due   Hepatitis B Vaccine (1 of 3 - 19+ 3-dose series) Never done   Breast Cancer Screening  10/29/2023   Pap with HPV screening  01/28/2024   COVID-19 Vaccine (1) 02/22/2024*   Flu Shot  04/24/2024*   DTaP/Tdap/Td vaccine (1 - Tdap) 02/05/2025*   HPV Vaccine (1 - Risk 3-dose SCDM series) 02/05/2025*   Colon Cancer Screening  06/23/2033   Hepatitis C Screening  Completed   HIV Screening  Completed   Pneumococcal Vaccine  Aged Out   Meningitis B Vaccine  Aged Out  *Topic was postponed. The date shown is not the original due date.    Discussed health benefits of physical activity, and encouraged her to engage in regular exercise appropriate for her age and condition.  Vitamin D  deficiency Assessment & Plan: Vitamin D  levels slightly low. Inconsistent with previous supplementation. Advised to resume supplementation. - Resume weekly vitamin D  supplementation. - Switch to over-the-counter vitamin D  supplement after current supply is finished.    Rheumatoid arthritis involving multiple sites with positive rheumatoid factor (HCC) -     Ibuprofen ; Take 1 tablet (800 mg total) by mouth every 8 (eight)  hours as needed.  Dispense: 90 tablet; Refill: 2  Attention and concentration deficit Assessment & Plan: Continue Vyvanse  70 mg daily and Adderall 5 mg in the afternoon PRN for crashes. PDMP reviewed, no aberrancies. 3 prescriptions of each medication with 0 refills sent into the pharmacy. Will follow-up again in 3  months.    Dyslipidemia, goal LDL below 100 Assessment & Plan: Last lipid panel: LDL 97, HDL 67, trig 49. LDL at goal. Continue with low fat diet to control cholesterol levels. Will cont to monitor.   Major depressive disorder, single episode, mild Assessment & Plan: Stable on Wellbutrin  with no significant changes to mood. Continue Wellbutrin  XL 300 mg daily.   Insomnia associated with menopause Assessment & Plan: Difficulty sleeping possibly related to perimenopause. Discussed amitriptyline  for sleep and migraine prevention. - Prescribed amitriptyline  25 mg for sleep, with dose adjustment flexibility. - Monitor response to amitriptyline  and adjust as necessary.   Dizziness Assessment & Plan: Intermittent vertigo episodes suggestive of BPPV due to displaced otoliths. Symptoms resolved spontaneously, indicating a self-limiting condition. - Monitor for recurrence of vertigo symptoms. - Consider symptomatic treatment with medication if symptoms recur. - Consider referral to physical therapy for Epley maneuver if symptoms persist or worsen. - Advised if episodes are accompanied with heart palpitations, to notify office so we can get an EKG or order holter monitor. Patient verbalized understanding and was in agreement with the plan.    Wellness examination Assessment & Plan: Routine health maintenance discussed. Pap smear completed. Mammogram and colonoscopy up to date. Tetanus vaccine declined. - Obtain records of Pap smear from [facility].  - Continue routine health maintenance screenings as scheduled.       Other orders -     Amitriptyline  HCl; Take 1 tablet (25 mg  total) by mouth at bedtime as needed for sleep.  Dispense: 30 tablet; Refill: 2 -     Lisdexamfetamine  Dimesylate; Take 1 capsule (70 mg total) by mouth daily.  Dispense: 30 capsule; Refill: 0 -     Lisdexamfetamine  Dimesylate; Take 1 capsule (70 mg total) by mouth daily.  Dispense: 30 capsule; Refill: 0 -     Lisdexamfetamine  Dimesylate; Take 1 capsule (70 mg total) by mouth daily.  Dispense: 30 capsule; Refill: 0 -     Amphetamine -Dextroamphetamine ; Take 1 tablet (5 mg total) by mouth daily.  Dispense: 30 tablet; Refill: 0 -     Amphetamine -Dextroamphetamine ; Take 1 tablet (5 mg total) by mouth daily.  Dispense: 30 tablet; Refill: 0 -     Amphetamine -Dextroamphetamine ; Take 1 tablet (5 mg total) by mouth daily.  Dispense: 30 tablet; Refill: 0    Return in about 3 months (around 05/01/2024) for ADHD.     Saddie JULIANNA Sacks, PA-C     [1]  Outpatient Medications Prior to Visit  Medication Sig   buPROPion  (WELLBUTRIN  XL) 300 MG 24 hr tablet Take 1 tablet (300 mg total) by mouth daily.   cyanocobalamin  (VITAMIN B12) 1000 MCG/ML injection Inject 1 mL (1,000 mcg total) into the muscle once a week.   levonorgestrel  (MIRENA ) 20 MCG/24HR IUD 1 each by Intrauterine route once.   linaclotide  (LINZESS ) 145 MCG CAPS capsule Take 1 capsule (145 mcg total) by mouth daily before breakfast.   mupirocin  ointment (BACTROBAN ) 2 % Apply to affected area of skin three times daily until healed   Na Sulfate-K Sulfate-Mg Sulfate concentrate 17.5-3.13-1.6 GM/177ML SOLN Take 1 Bottle by mouth as directed One kit contains 2 bottles.  Take both bottles at the times instructed by your provider.   neomycin -polymyxin-dexamethasone  (MAXITROL ) 0.1 % ophthalmic suspension Place 1 drop into the left eye 4 (four) times daily.   omeprazole  (PRILOSEC) 40 MG capsule Take 1 capsule (40 mg total) by mouth 2 (two) times daily.   Vitamin D , Ergocalciferol , (DRISDOL ) 1.25 MG (50000 UNIT)  CAPS capsule TAKE 1 CAPSULE BY MOUTH EVERY 7 DAYS.    [DISCONTINUED] amphetamine -dextroamphetamine  (ADDERALL) 5 MG tablet Take 1 tablet (5 mg total) by mouth daily.   [DISCONTINUED] amphetamine -dextroamphetamine  (ADDERALL) 5 MG tablet Take 1 tablet (5 mg total) by mouth daily.   [DISCONTINUED] amphetamine -dextroamphetamine  (ADDERALL) 5 MG tablet Take 1 tablet (5 mg total) by mouth daily.   [DISCONTINUED] hydroxychloroquine  (PLAQUENIL ) 200 MG tablet Take 1.5 tablets (300 mg total) by mouth daily or 200 mg (1 tab) alternating with 400 mg (2 tabs) every other day. Sandoz or Prasco generic pharmaceuticals medically necessary   [DISCONTINUED] hydroxychloroquine  (PLAQUENIL ) 200 MG tablet Take 1 tablet (200 mg total) by mouth 2 (two) times daily.   [DISCONTINUED] ibuprofen  (ADVIL ) 800 MG tablet Take 1 tablet (800 mg total) by mouth every 8 (eight) hours as needed.   [DISCONTINUED] lisdexamfetamine  (VYVANSE ) 70 MG capsule Take 1 capsule (70 mg total) by mouth daily.   [DISCONTINUED] lisdexamfetamine  (VYVANSE ) 70 MG capsule Take 1 capsule (70 mg total) by mouth daily.   [DISCONTINUED] lisdexamfetamine  (VYVANSE ) 70 MG capsule Take 1 capsule (70 mg total) by mouth daily.   [DISCONTINUED] phenazopyridine  (PYRIDIUM ) 200 MG tablet Take 1 tablet (200 mg total) by mouth 3 (three) times daily as needed for pain.   No facility-administered medications prior to visit.   "

## 2024-02-06 NOTE — Assessment & Plan Note (Signed)
 Vitamin D  levels slightly low. Inconsistent with previous supplementation. Advised to resume supplementation. - Resume weekly vitamin D  supplementation. - Switch to over-the-counter vitamin D  supplement after current supply is finished.

## 2024-02-13 ENCOUNTER — Other Ambulatory Visit: Payer: Self-pay

## 2024-02-14 ENCOUNTER — Other Ambulatory Visit: Payer: Self-pay

## 2024-02-16 ENCOUNTER — Other Ambulatory Visit: Payer: Self-pay

## 2024-02-16 MED ORDER — METHYLPREDNISOLONE 4 MG PO TABS
ORAL_TABLET | ORAL | 0 refills | Status: AC
  Filled 2024-02-16: qty 21, 6d supply, fill #0

## 2024-02-16 MED ORDER — DIAZEPAM 5 MG PO TABS
5.0000 mg | ORAL_TABLET | Freq: Every day | ORAL | 0 refills | Status: AC
  Filled 2024-02-16: qty 4, 4d supply, fill #0

## 2024-02-16 MED ORDER — AMOXICILLIN 500 MG PO CAPS
500.0000 mg | ORAL_CAPSULE | Freq: Three times a day (TID) | ORAL | 0 refills | Status: AC
  Filled 2024-02-16: qty 22, 8d supply, fill #0

## 2024-02-24 ENCOUNTER — Other Ambulatory Visit: Payer: Self-pay

## 2024-02-24 MED ORDER — HYDROCODONE-ACETAMINOPHEN 5-325 MG PO TABS
1.0000 | ORAL_TABLET | Freq: Four times a day (QID) | ORAL | 0 refills | Status: AC | PRN
  Filled 2024-02-24: qty 16, 2d supply, fill #0

## 2024-02-27 ENCOUNTER — Other Ambulatory Visit: Payer: Self-pay

## 2024-02-27 ENCOUNTER — Other Ambulatory Visit (HOSPITAL_COMMUNITY): Payer: Self-pay

## 2024-05-01 ENCOUNTER — Ambulatory Visit

## 2024-11-05 ENCOUNTER — Ambulatory Visit
